# Patient Record
Sex: Female | Born: 1960 | Race: Black or African American | Hispanic: No | Marital: Married | Smoking: Former smoker
Health system: Southern US, Community
[De-identification: ages and names within clinical notes are randomized; demographics above are authoritative.]

## PROBLEM LIST (undated history)

## (undated) DIAGNOSIS — I10 Essential (primary) hypertension: Secondary | ICD-10-CM

## (undated) DIAGNOSIS — E7211 Homocystinuria: Secondary | ICD-10-CM

## (undated) DIAGNOSIS — K219 Gastro-esophageal reflux disease without esophagitis: Secondary | ICD-10-CM

## (undated) DIAGNOSIS — I639 Cerebral infarction, unspecified: Secondary | ICD-10-CM

## (undated) DIAGNOSIS — R1900 Intra-abdominal and pelvic swelling, mass and lump, unspecified site: Secondary | ICD-10-CM

## (undated) DIAGNOSIS — R630 Anorexia: Secondary | ICD-10-CM

## (undated) HISTORY — DX: Cerebral infarction, unspecified: I63.9

## (undated) HISTORY — DX: Gastro-esophageal reflux disease without esophagitis: K21.9

## (undated) HISTORY — DX: Anorexia: R63.0

## (undated) HISTORY — DX: Essential (primary) hypertension: I10

## (undated) HISTORY — PX: OTHER SURGICAL HISTORY: SHX169

## (undated) HISTORY — DX: Homocystinuria: E72.11

## (undated) HISTORY — PX: TUBAL LIGATION: SHX77

## (undated) HISTORY — DX: Intra-abdominal and pelvic swelling, mass and lump, unspecified site: R19.00

---

## 2003-05-03 ENCOUNTER — Emergency Department (HOSPITAL_COMMUNITY): Admission: EM | Admit: 2003-05-03 | Discharge: 2003-05-03 | Payer: Self-pay | Admitting: Emergency Medicine

## 2003-11-14 DIAGNOSIS — I639 Cerebral infarction, unspecified: Secondary | ICD-10-CM

## 2003-11-14 HISTORY — DX: Cerebral infarction, unspecified: I63.9

## 2005-01-17 ENCOUNTER — Inpatient Hospital Stay (HOSPITAL_COMMUNITY): Admission: EM | Admit: 2005-01-17 | Discharge: 2005-01-23 | Payer: Self-pay | Admitting: Emergency Medicine

## 2005-01-17 ENCOUNTER — Ambulatory Visit: Payer: Self-pay | Admitting: Cardiology

## 2005-01-17 ENCOUNTER — Ambulatory Visit: Payer: Self-pay | Admitting: Physical Medicine & Rehabilitation

## 2005-01-17 ENCOUNTER — Ambulatory Visit: Payer: Self-pay | Admitting: Infectious Diseases

## 2005-01-18 ENCOUNTER — Encounter: Payer: Self-pay | Admitting: Cardiology

## 2005-01-31 ENCOUNTER — Ambulatory Visit: Payer: Self-pay | Admitting: Internal Medicine

## 2005-03-29 ENCOUNTER — Ambulatory Visit: Payer: Self-pay | Admitting: Internal Medicine

## 2005-04-24 ENCOUNTER — Ambulatory Visit: Payer: Self-pay | Admitting: Internal Medicine

## 2005-08-17 ENCOUNTER — Ambulatory Visit: Payer: Self-pay | Admitting: Internal Medicine

## 2005-08-17 ENCOUNTER — Ambulatory Visit (HOSPITAL_COMMUNITY): Admission: RE | Admit: 2005-08-17 | Discharge: 2005-08-17 | Payer: Self-pay | Admitting: Internal Medicine

## 2005-08-17 ENCOUNTER — Encounter (INDEPENDENT_AMBULATORY_CARE_PROVIDER_SITE_OTHER): Payer: Self-pay | Admitting: Internal Medicine

## 2005-08-17 LAB — CONVERTED CEMR LAB: Pap Smear: NORMAL

## 2005-08-31 ENCOUNTER — Ambulatory Visit: Payer: Self-pay | Admitting: Internal Medicine

## 2005-09-18 ENCOUNTER — Encounter: Admission: RE | Admit: 2005-09-18 | Discharge: 2005-09-29 | Payer: Self-pay | Admitting: Internal Medicine

## 2006-01-24 ENCOUNTER — Ambulatory Visit (HOSPITAL_COMMUNITY): Admission: RE | Admit: 2006-01-24 | Discharge: 2006-01-24 | Payer: Self-pay | Admitting: Internal Medicine

## 2006-02-12 ENCOUNTER — Encounter: Admission: RE | Admit: 2006-02-12 | Discharge: 2006-02-12 | Payer: Self-pay | Admitting: Sports Medicine

## 2006-11-23 ENCOUNTER — Encounter (INDEPENDENT_AMBULATORY_CARE_PROVIDER_SITE_OTHER): Payer: Self-pay | Admitting: Internal Medicine

## 2006-11-23 DIAGNOSIS — M545 Low back pain, unspecified: Secondary | ICD-10-CM | POA: Insufficient documentation

## 2006-11-23 DIAGNOSIS — I10 Essential (primary) hypertension: Secondary | ICD-10-CM | POA: Insufficient documentation

## 2006-11-23 DIAGNOSIS — E7210 Disorders of sulfur-bearing amino-acid metabolism, unspecified: Secondary | ICD-10-CM | POA: Insufficient documentation

## 2006-11-23 DIAGNOSIS — Z8679 Personal history of other diseases of the circulatory system: Secondary | ICD-10-CM | POA: Insufficient documentation

## 2006-11-23 DIAGNOSIS — E7211 Homocystinuria: Secondary | ICD-10-CM

## 2006-11-23 HISTORY — DX: Homocystinuria: E72.11

## 2006-11-23 HISTORY — DX: Essential (primary) hypertension: I10

## 2006-11-27 ENCOUNTER — Encounter (INDEPENDENT_AMBULATORY_CARE_PROVIDER_SITE_OTHER): Payer: Self-pay | Admitting: Internal Medicine

## 2006-11-27 ENCOUNTER — Ambulatory Visit: Payer: Self-pay | Admitting: Internal Medicine

## 2006-11-27 DIAGNOSIS — R1900 Intra-abdominal and pelvic swelling, mass and lump, unspecified site: Secondary | ICD-10-CM | POA: Insufficient documentation

## 2006-11-27 DIAGNOSIS — R630 Anorexia: Secondary | ICD-10-CM

## 2006-11-27 DIAGNOSIS — K219 Gastro-esophageal reflux disease without esophagitis: Secondary | ICD-10-CM | POA: Insufficient documentation

## 2006-11-27 HISTORY — DX: Gastro-esophageal reflux disease without esophagitis: K21.9

## 2006-11-27 HISTORY — DX: Anorexia: R63.0

## 2006-11-27 HISTORY — DX: Intra-abdominal and pelvic swelling, mass and lump, unspecified site: R19.00

## 2006-11-27 LAB — CONVERTED CEMR LAB
BUN: 15 mg/dL (ref 6–23)
CO2: 21 meq/L (ref 19–32)
Calcium: 9.4 mg/dL (ref 8.4–10.5)
Chloride: 106 meq/L (ref 96–112)
Cholesterol: 166 mg/dL (ref 0–200)
Creatinine, Ser: 0.89 mg/dL (ref 0.40–1.20)
Glucose, Bld: 90 mg/dL (ref 70–99)
HDL: 74 mg/dL (ref >39)
LDL Cholesterol: 70 mg/dL (ref 0–99)
Potassium: 4.6 meq/L (ref 3.5–5.3)
Sodium: 137 meq/L (ref 135–145)
TSH: 0.867 microintl units/mL (ref 0.350–5.50)
Total CHOL/HDL Ratio: 2.2
Triglycerides: 110 mg/dL (ref <150)
VLDL: 22 mg/dL (ref 0–40)

## 2006-11-29 ENCOUNTER — Telehealth (INDEPENDENT_AMBULATORY_CARE_PROVIDER_SITE_OTHER): Payer: Self-pay | Admitting: Internal Medicine

## 2006-11-30 LAB — CONVERTED CEMR LAB

## 2006-12-26 ENCOUNTER — Telehealth: Payer: Self-pay | Admitting: *Deleted

## 2007-01-31 ENCOUNTER — Encounter (INDEPENDENT_AMBULATORY_CARE_PROVIDER_SITE_OTHER): Payer: Self-pay | Admitting: Internal Medicine

## 2007-06-26 ENCOUNTER — Telehealth: Payer: Self-pay | Admitting: Infectious Disease

## 2007-12-25 ENCOUNTER — Telehealth: Payer: Self-pay | Admitting: *Deleted

## 2008-01-27 ENCOUNTER — Ambulatory Visit: Payer: Self-pay | Admitting: Hospitalist

## 2008-01-27 ENCOUNTER — Encounter (INDEPENDENT_AMBULATORY_CARE_PROVIDER_SITE_OTHER): Payer: Self-pay | Admitting: Internal Medicine

## 2008-01-27 DIAGNOSIS — K029 Dental caries, unspecified: Secondary | ICD-10-CM | POA: Insufficient documentation

## 2008-01-28 LAB — CONVERTED CEMR LAB
ALT: 8 units/L (ref 0–35)
AST: 11 units/L (ref 0–37)
Albumin: 4.3 g/dL (ref 3.5–5.2)
Alkaline Phosphatase: 39 units/L (ref 39–117)
BUN: 13 mg/dL (ref 6–23)
Basophils Absolute: 0 10*3/uL (ref 0.0–0.1)
Basophils Relative: 0 % (ref 0–1)
CO2: 20 meq/L (ref 19–32)
Calcium: 9.2 mg/dL (ref 8.4–10.5)
Chloride: 109 meq/L (ref 96–112)
Cholesterol: 157 mg/dL (ref 0–200)
Creatinine, Ser: 1.03 mg/dL (ref 0.40–1.20)
Eosinophils Absolute: 0.2 10*3/uL (ref 0.0–0.7)
Eosinophils Relative: 2 % (ref 0–5)
Glucose, Bld: 98 mg/dL (ref 70–99)
HCT: 34.2 % — ABNORMAL LOW (ref 36.0–46.0)
HDL: 81 mg/dL (ref >39)
Hemoglobin: 11.6 g/dL — ABNORMAL LOW (ref 12.0–15.0)
LDL Cholesterol: 58 mg/dL (ref 0–99)
Lymphocytes Relative: 39 % (ref 12–46)
Lymphs Abs: 3.7 10*3/uL (ref 0.7–4.0)
MCHC: 33.9 g/dL (ref 30.0–36.0)
MCV: 92.7 fL (ref 78.0–100.0)
Monocytes Absolute: 1.2 10*3/uL — ABNORMAL HIGH (ref 0.1–1.0)
Monocytes Relative: 13 % — ABNORMAL HIGH (ref 3–12)
Neutro Abs: 4.5 10*3/uL (ref 1.7–7.7)
Neutrophils Relative %: 47 % (ref 43–77)
Platelets: 277 10*3/uL (ref 150–400)
Potassium: 4.8 meq/L (ref 3.5–5.3)
RBC: 3.69 M/uL — ABNORMAL LOW (ref 3.87–5.11)
RDW: 14.6 % (ref 11.5–15.5)
Sodium: 140 meq/L (ref 135–145)
TSH: 0.9 microintl units/mL (ref 0.350–5.50)
Total Bilirubin: 0.3 mg/dL (ref 0.3–1.2)
Total CHOL/HDL Ratio: 1.9
Total Protein: 7.5 g/dL (ref 6.0–8.3)
Triglycerides: 92 mg/dL (ref <150)
VLDL: 18 mg/dL (ref 0–40)
WBC: 9.5 10*3/uL (ref 4.0–10.5)

## 2009-12-21 ENCOUNTER — Telehealth: Payer: Self-pay | Admitting: Internal Medicine

## 2010-01-03 ENCOUNTER — Ambulatory Visit: Payer: Self-pay | Admitting: Internal Medicine

## 2010-01-03 ENCOUNTER — Encounter: Payer: Self-pay | Admitting: Internal Medicine

## 2010-01-03 LAB — CONVERTED CEMR LAB
ALT: 8 units/L (ref 0–35)
AST: 13 units/L (ref 0–37)
Albumin: 3.9 g/dL (ref 3.5–5.2)
Alkaline Phosphatase: 48 units/L (ref 39–117)
BUN: 9 mg/dL (ref 6–23)
CO2: 23 meq/L (ref 19–32)
Calcium: 9.2 mg/dL (ref 8.4–10.5)
Chloride: 102 meq/L (ref 96–112)
Cholesterol: 153 mg/dL (ref 0–200)
Creatinine, Ser: 0.9 mg/dL (ref 0.40–1.20)
Glucose, Bld: 96 mg/dL (ref 70–99)
HDL: 85 mg/dL (ref >39)
LDL Cholesterol: 52 mg/dL (ref 0–99)
Potassium: 4.4 meq/L (ref 3.5–5.3)
Sodium: 136 meq/L (ref 135–145)
Total Bilirubin: 0.3 mg/dL (ref 0.3–1.2)
Total CHOL/HDL Ratio: 1.8
Total Protein: 7.2 g/dL (ref 6.0–8.3)
Triglycerides: 82 mg/dL (ref <150)
VLDL: 16 mg/dL (ref 0–40)

## 2010-01-06 ENCOUNTER — Ambulatory Visit (HOSPITAL_COMMUNITY): Admission: RE | Admit: 2010-01-06 | Discharge: 2010-01-06 | Payer: Self-pay | Admitting: Internal Medicine

## 2010-03-03 ENCOUNTER — Telehealth: Payer: Self-pay | Admitting: Internal Medicine

## 2010-10-12 ENCOUNTER — Telehealth: Payer: Self-pay | Admitting: Internal Medicine

## 2010-12-04 ENCOUNTER — Encounter: Payer: Self-pay | Admitting: *Deleted

## 2010-12-13 NOTE — Progress Notes (Signed)
Summary: refill/gg  Phone Note Refill Request  on March 03, 2010 4:34 PM  Refills Requested: Medication #1:  LISINOPRIL-HYDROCHLOROTHIAZIDE 20-25 MG  TABS Take 1 tablet by mouth once a day.   Last Refilled: 01/26/2010  Method Requested: Electronic Initial call taken by: Merrie Roof RN,  March 03, 2010 4:35 PM  Follow-up for Phone Call        Rx denied because, she received 6 refills in February 2011, which means she doesn't need more refills for now.     Appended Document: refill/gg pharmacy informed

## 2010-12-13 NOTE — Miscellaneous (Signed)
Summary: HIPAA Restrictions  HIPAA Restrictions   Imported By: Florinda Marker 01/03/2010 15:40:58  _____________________________________________________________________  External Attachment:    Type:   Image     Comment:   External Document

## 2010-12-13 NOTE — Progress Notes (Signed)
Summary: med refill/gp   Phone Note Refill Request Message from:  Patient on October 12, 2010 5:07 PM  Refills Requested: Medication #1:  LISINOPRIL-HYDROCHLOROTHIAZIDE 20-25 MG  TABS Take 1 tablet by mouth once a day.  Method Requested: Electronic Initial call taken by: Chinita Pester RN,  October 12, 2010 5:08 PM  Follow-up for Phone Call        Refill approved for 2 month; patient needs to be seen for labwork before further refills are give. Please arrange followup visit.  Thanks!!!!    Prescriptions: LISINOPRIL-HYDROCHLOROTHIAZIDE 20-25 MG  TABS (LISINOPRIL-HYDROCHLOROTHIAZIDE) Take 1 tablet by mouth once a day  #31 x 2   Entered and Authorized by:   Vassie Loll MD   Signed by:   Vassie Loll MD on 10/13/2010   Method used:   Electronically to        [facility] (retail)       [address]       [place], [place]  [id number]       Ph: [phone]       Fax: [phone]   RxID:   [id number]

## 2010-12-13 NOTE — Progress Notes (Signed)
Summary: refill/ hla  Phone Note Refill Request Message from:  Fax from Pharmacy on December 21, 2009 12:31 PM  Refills Requested: Medication #1:  LISINOPRIL-HYDROCHLOROTHIAZIDE 20-25 MG  TABS Take 1 tablet by mouth once a day.   Last Refilled: 11/19/2009 Initial call taken by: Marin Roberts RN,  December 21, 2009 12:31 PM  Follow-up for Phone Call        Will refill her medications just for 61month; labs has not been checked since 2009. arrange a followup appoinment at earliest convenience.    Prescriptions: LISINOPRIL-HYDROCHLOROTHIAZIDE 20-25 MG  TABS (LISINOPRIL-HYDROCHLOROTHIAZIDE) Take 1 tablet by mouth once a day  #31 x 1   Entered and Authorized by:   Vassie Loll MD   Signed by:   Vassie Loll MD on 12/21/2009   Method used:   Electronically to        [facility] (retail)       [address]       [place], [place]  [id number]       Ph: [phone]       Fax: [phone]   RxID:   [id number]   Appended Document: refill/ hla finally spoke w/ pt, an appt is set, ask to come fasting for poss labs, pt is agreeable

## 2010-12-13 NOTE — Assessment & Plan Note (Signed)
Summary: checkup, labs, med refills/pcp-Erum Cercone/hla   Vital Signs:  Patient profile:   50 year old female Height:      62.3 inches (158.24 cm) Weight:      143.1 pounds (65.05 kg) BMI:     26.02 Temp:     97.5 degrees F (36.39 degrees C) oral Pulse rate:   77 / minute BP sitting:   112 / 80  (left arm)  Vitals Entered By: Stanton Kidney Ditzler RN (January 03, 2010 8:39 AM) Is Patient Diabetic? No Pain Assessment Patient in pain? no      Nutritional Status BMI of 25 - 29 = overweight Nutritional Status Detail appetite down  Have you ever been in a relationship where you felt threatened, hurt or afraid?denies   Does patient need assistance? Functional Status Self care Ambulation Normal Comments Refills on meds.   Primary Care Provider:  Yvonne Kendall, MD   History of Present Illness: 50 y/o with pmh significant for CVA (with residual left side weakness), HTN and dyspepsia; who comes to the clinic for followup and to get refills of her medications.  Patient denies any increased weakness on her left side or any new deficit; she also denies HA's, CP, SOB, nausea, vomiting, abdominal pain, diarrhea, peripheral edema or any other complaints.  Patient is compliant with her medications and even she is still smoking she has cut down to 1 pack per month now.  BP today 112/80.    Depression History:      The patient denies a depressed mood most of the day and a diminished interest in her usual daily activities.        The patient denies that she feels like life is not worth living, denies that she wishes that she were dead, and denies that she has thought about ending her life.         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 Take 1 Tablet By Mouth Two Times A Day 2)  Protonix 40 Mg Tbec (Pantoprazole Sodium) .... Take 1 Tablet By Mouth Once A Day 3)  Lisinopril-Hydrochlorothiazide 20-25 Mg  Tabs (Lisinopril-Hydrochlorothiazide) .... Take 1 Tablet By Mouth Once A Day  Current Medications (verified): 1)  Aggrenox 25-200 Mg Cp12 (Aspirin-Dipyridamole) .... Take 1 Tablet By Mouth Two Times A Day 2)  Protonix 40 Mg Tbec (Pantoprazole Sodium) .... Take 1 Tablet By Mouth Once A Day 3)  Lisinopril-Hydrochlorothiazide 20-25 Mg  Tabs (Lisinopril-Hydrochlorothiazide) .... 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  Physical Exam  General:  alert, well-developed, well-nourished, well-hydrated, and cooperative to examination.   Lungs:  Normal respiratory effort, chest expands symmetrically. Lungs are clear to auscultation, no crackles or wheezes. Heart:  Normal rate and regular rhythm. S1 and S2 normal without gallop, murmur, click, rub or other extra sounds. Abdomen:  Bowel sounds positive,abdomen soft and non-tender without masses, organomegaly or hernias noted. Msk:  normal ROM, no joint tenderness, no joint swelling, and no joint deformities.   Neurologic:  alert & oriented X3.  Muscle strenght 5/5 on her right side, 3/5 on her left side (residual weakness after CVA); hyperreflexia on the left in comparisson to the right, CN II-XII essentially intact.   Impression & Recommendations:  Problem # 1:  GERD (ICD-530.81) Patient is doing great and denies any worsening on her reflux and indigestion symptoms. Will continue using daily protonix.  Her updated medication list for this problem includes:    Protonix 40 Mg Tbec (Pantoprazole sodium) .Marland Kitchen... Take 1 tablet by mouth once a day  Problem # 2:  HYPERTENSION (ICD-401.9) Wellcontrolled and at goal. Will continue current regimen and will check renalfunction and electrolytes.  Her updated medication list for this problem includes:    Lisinopril-hydrochlorothiazide 20-25 Mg Tabs (Lisinopril-hydrochlorothiazide) .Marland Kitchen... Take 1 tablet by mouth once a day  Orders: T-Comprehensive Metabolic Panel ([id number])  Problem # 3:  CEREBROVASCULAR ACCIDENT, HX OF (ICD-V12.50) Patient w/o any new deficit. Will continue controlling her BP and will also continue aggrenox two times a day. Will check lipid profile and if needed will start statins. Patient will continue daily exercises to make her left side stronger.  Problem # 4:   Preventive Health Care (ICD-V70.0) Will arrange screening for colon cancer and will also schedule her mammogram for this year. During next visit will perform a pap smear. Patient will received smoking cessation tips and also will get hot line phone number for outpatient support.  Complete Medication List: 1)  Aggrenox 25-200 Mg Cp12 (Aspirin-dipyridamole) .... Take 1 tablet by mouth two times a day 2)  Protonix 40 Mg Tbec (Pantoprazole sodium) .... Take 1 tablet by mouth once a day 3)  Lisinopril-hydrochlorothiazide 20-25 Mg Tabs (Lisinopril-hydrochlorothiazide) .... Take 1 tablet by mouth once a day  Other Orders: Gastroenterology Referral (GI) Mammogram (Screening) (Mammo) T-Lipid Profile ([id number])  Patient Instructions: 1)  Please schedule a follow-up appointment in 6 months. 2)  You will be called with any abnormalities in your bloodwork from today. 3)  Continue all your medicines as prescribed. 4)  Call 1-800-quit-now (hot line to help you succesfully quit smoking) 5)  Tobacco is very bad for your health and your loved ones! You Should stop smoking!. 6)  Stop Smoking Tips: Choose a Quit date. Cut down before the Quit date. decide what you will do as a substitute when you feel the urge to smoke(gum,toothpick,exercise). Prescriptions: LISINOPRIL-HYDROCHLOROTHIAZIDE 20-25  MG  TABS (LISINOPRIL-HYDROCHLOROTHIAZIDE) Take 1 tablet by mouth once a day  #31 x 6   Entered and Authorized by:   Vassie Loll MD   Signed by:   Vassie Loll MD on 01/03/2010   Method used:   Electronically to        [facility] (retail)       [address]       [place], [place]  [id number]       Ph: [phone]       Fax: [phone]   RxID:   [id number] PROTONIX 40 MG TBEC (PANTOPRAZOLE SODIUM) Take 1 tablet by mouth once a day  #31 x 10   Entered and Authorized by:   Vassie Loll MD   Signed by:   Vassie Loll MD on 01/03/2010   Method used:   Electronically to         [facility] (retail)       [address]       [place], [place]  [id number]       Ph: [phone]       Fax: [phone]   RxID:   [id number] AGGRENOX 25-200 MG CP12 (ASPIRIN-DIPYRIDAMOLE) Take 1 tablet by mouth two times a day  #62 x 9   Entered and Authorized by:   Vassie Loll MD   Signed by:   Vassie Loll MD on 01/03/2010   Method used:   Electronically to        [facility] (retail)       [address]       [place], [place]  [id number]       Ph: [phone]       Fax: [phone]   RxID:   [id number]   Prevention & Chronic Care Immunizations   Influenza vaccine: Not documented   Influenza vaccine deferral: Deferred  (01/03/2010)    Tetanus booster: Not documented   Td booster deferral: Deferred  (01/03/2010)    Pneumococcal vaccine: Not documented   Pneumococcal vaccine deferral: Deferred  (01/03/2010)  Colorectal Screening   Hemoccult: Not documented    Colonoscopy: Not documented   Colonoscopy action/deferral: GI referral  (01/03/2010)  Other Screening   Pap smear: 11/30/06  (11/30/2006)   Pap smear action/deferral: Deferred  (01/03/2010)    Mammogram: Abnormal  (02/12/2006)   Mammogram action/deferral: Ordered  (01/03/2010)   Smoking status: current  (01/03/2010)   Smoking cessation counseling: yes  (01/03/2010)    Screening comments: Patient will like to has her pap smear done during next visit.  Lipids   Total Cholesterol: 157  (01/27/2008)   Lipid panel action/deferral: Lipid Panel ordered   LDL: 58  (01/27/2008)   LDL Direct: Not documented   HDL: 81  (01/27/2008)   Triglycerides: 92  (01/27/2008)  Hypertension   Last Blood Pressure: 112 / 80  (01/03/2010)   Serum creatinine: 1.03  (01/27/2008)   Serum potassium 4.8  (01/27/2008) CMP ordered     Hypertension flowsheet reviewed?: Yes   Progress toward BP goal: At goal  Self-Management Support :    Patient will work on the  following items until the next clinic visit to reach self-care goals:     Medications and monitoring: take my medicines every day, bring all of my medications to every visit  (01/03/2010)     Eating: drink diet soda or water instead of juice or soda, eat more vegetables, use fresh or frozen vegetables, eat  foods that are low in salt, eat baked foods instead of fried foods, eat fruit for snacks and desserts, limit or avoid alcohol  (01/03/2010)    Hypertension self-management support: Written self-care plan, Resources for patients handout  (01/03/2010)   Hypertension self-care plan printed.      Resource handout printed.   Nursing Instructions: GI referral for screening colonoscopy (see order) Schedule screening mammogram (see order)   Process Orders Check Orders Results:     [facility]: Check successful Tests Sent for requisitioning (January 03, 2010 9:08 AM):     01/03/2010: [facility] -- T-Lipid Profile [phone] (signed)     01/03/2010: [facility] -- T-Comprehensive Metabolic Panel [phone] (signed)

## 2011-01-09 ENCOUNTER — Other Ambulatory Visit: Payer: Self-pay | Admitting: *Deleted

## 2011-01-09 MED ORDER — ASPIRIN-DIPYRIDAMOLE ER 25-200 MG PO CP12: 1.0000 | ORAL_CAPSULE | Freq: Two times a day (BID) | ORAL | Status: AC

## 2011-01-27 ENCOUNTER — Other Ambulatory Visit: Payer: Self-pay | Admitting: *Deleted

## 2011-01-27 MED ORDER — LISINOPRIL-HYDROCHLOROTHIAZIDE 20-25 MG PO TABS: 1.0000 | ORAL_TABLET | Freq: Every day | ORAL | Status: DC

## 2011-01-31 ENCOUNTER — Encounter: Payer: Self-pay | Admitting: Internal Medicine

## 2011-03-09 ENCOUNTER — Other Ambulatory Visit: Payer: Self-pay | Admitting: Internal Medicine

## 2011-03-17 ENCOUNTER — Other Ambulatory Visit: Payer: Self-pay | Admitting: *Deleted

## 2011-03-17 MED ORDER — LISINOPRIL-HYDROCHLOROTHIAZIDE 20-25 MG PO TABS: 1.0000 | ORAL_TABLET | Freq: Every day | ORAL | Status: DC

## 2011-03-20 ENCOUNTER — Other Ambulatory Visit: Payer: Self-pay | Admitting: Internal Medicine

## 2011-03-20 ENCOUNTER — Other Ambulatory Visit: Payer: Self-pay | Admitting: *Deleted

## 2011-03-31 NOTE — Discharge Summary (Signed)
NAMEKEYARI, [name]               ACCOUNT NO.:  [id number]   MEDICAL RECORD NO.:  [id number]          PATIENT TYPE:  INP   LOCATION:  3001                         FACILITY:  [facility]   PHYSICIAN:  Fransisco Hertz, M.D.  DATE OF BIRTH:  [birth year]   DATE OF ADMISSION:  01/17/2005  DATE OF DISCHARGE:  01/23/2005                                 DISCHARGE SUMMARY   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  Her discharge medications were  as follows:  1.  Aggrenox one b.i.d.  2.  FOLTX one daily.  3.  Hydrochlorothiazide 25 mg daily.  4.  Prinivil 10 mg daily.  5.  Vicodin 1-2 tabs every 6 hours as needed.   ATTENDING:  Dr. Lina Sayre.   UPPER LEVEL RESIDENT:  Dr. Kathe Mariner.      ML/MEDQ  D:  01/24/2005  T:  01/24/2005  Job:  [id number]

## 2011-03-31 NOTE — Consult Note (Signed)
[name], [name]               ACCOUNT NO.:  [id number]   MEDICAL RECORD NO.:  [id number]          PATIENT TYPE:  INP   LOCATION:  3001                         FACILITY:  [facility]   PHYSICIAN:  Pramod P. Pearlean Brownie, MD    DATE OF BIRTH:  1961/09/24   DATE OF CONSULTATION:  DATE OF DISCHARGE:                                   CONSULTATION   REFERRING PHYSICIAN:  Fransisco Hertz, M.D.   REASON FOR REFERRAL:  Stroke.   [name] is a 50 year old African-American lady with vascular risk factors  only of hypertension and smoking, who developed a right middle cerebral  artery parietal infarction with left-sided weakness, numbness, and visual  field deficit.  She has been admitted a few days ago.  CT scan of the head  was unremarkable, and MRI scan of the brain showed a right parietal middle  cerebral artery distribution and infarction along the posterior division.  MRA of the brain revealed diminished branches of the M2 portion of the right  middle cerebral artery.  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  She has had a subsequent stroke workup including  carotid ultrasound which reveals no hemodynamically significant stenosis.  She has obtained some significant improvement, though she still has left-  sided sensory deficit and visual field deficit persisted.   HOME MEDICATIONS:  Lopressor.   PAST MEDICAL HISTORY:  1.  Hypertension.  2.  Recent headaches.   SOCIAL HISTORY:  She is married.  She lives with her husband.  She smokes  one pack per day for 20 years, and drinks one beer a week.   MEDICATION ALLERGIES:  None known.   FAMILY HISTORY:  Significant for stroke in a cousin.   PHYSICAL EXAMINATION:  GENERAL:  Reveals a pleasant middle-aged, African-  American lady who is not in distress.  TEMPERATURE:  She is afebrile.  PULSE RATE:  72 per minute, regular.  RESPIRATORY  RATE:  16 per minute.  DISTAL PULSES:  Well felt.  NEUROLOGIC:  She is drowsy, but opens eyes easily and follows commands quite  well.  There is no slurred speech or aphasia.  Eye movements are full range,  without nystagmus.  She has a dense left homonymous hemianopsia.  Visual  acuity is adequate.  Face is symmetric.  Palatal movements are normal.  Tongue is midline.  Motor system exam reveals no drift, but there is minimum  weakness of fine finger movements on the left and foot tapping on the left.  Finger-to-nose coordination is impaired on the left compared to the right.  She has decreased touch and pinprick sensation on the left side as well as  inattention to the left.  Position sense seems to be preserved.   DATA REVIEWED:  As previously stated, MRI scan, MRA, and ultrasound results  are reviewed.   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   PLAN:  It would be appropriate to do transcranial Doppler bubble study to  look for patent foramen ovale and evidence of paradoxical embolism.  After  taking verbal informed consent from the patient, I explained the risks,  benefits, and gave the patient an opportunity to ask questions.  Transcranial Doppler bubble study was done at the bedside.  The middle and  anterior cerebral arteries were insonated using standard protocol.  9 mL of  saline along with 1 mL of air were agitated in 3-way stopcock and injected  into IV in the right forearm.  No high-intensity transient signals, or HITS,  were identified.  The procedure was repeated after performing Valsalva  maneuver with good effort, and again no HITS were noted.   IMPRESSION:  Negative transcranial Doppler bubble study.  No evidence of  right-to-left intracardiac communication by this study.  The patient's  labs  showed elevated homocysteine of 14.75, with triglycerides of 197, and LDL of  57, total cholesterol 149.   PLAN:  I would recommend treatment for elevated homocysteine with Foltx once  a day as well as for triglycerides with diet control.  Check hypercoagulable  panel labs as well.  Physical and occupational rehabilitation consult.  A  transesophageal echocardiogram is to be done later today.  If no clear-cut  cardiac source of embolism is found, I recommend changing aspirin to  Aggrenox for secondary stroke prevention.   Thank you for the referral.      PPS/MEDQ  D:  01/20/2005  T:  01/20/2005  Job:  [id number]   cc:   Fransisco Hertz, M.D.  [address]  [place] [address]  Fax: [phone]

## 2011-03-31 NOTE — Discharge Summary (Signed)
[name], [name]               ACCOUNT NO.:  [id number]   MEDICAL RECORD NO.:  [id number]          PATIENT TYPE:  INP   LOCATION:  3001                         FACILITY:  [facility]   PHYSICIAN:  Zetta Bills, MD          DATE OF BIRTH:  04-17-1961   DATE OF ADMISSION:  01/17/2005  DATE OF DISCHARGE:  01/23/2005                                 DISCHARGE SUMMARY   ADDENDUM:   DISCHARGE DIAGNOSIS:  Cerebrovascular accident due to ischemia in the right  parietal area of the brain.   DISCHARGE MEDICATIONS:  1.  Aggrenox one tablet b.i.d.  2.  Foltx one tablet daily.  3.  Hydrochlorothiazide 25 mg daily.  4.  Prednisone 10 mg daily.  5.  Vicodin one to two tablets q.6h. p.r.n.   Dictated by Corrie Dandy __________      JP/MEDQ  D:  01/24/2005  T:  01/24/2005  Job:  [id number]

## 2011-03-31 NOTE — Consult Note (Signed)
[name], [name]               ACCOUNT NO.:  [id number]   MEDICAL RECORD NO.:  [id number]          PATIENT TYPE:  INP   LOCATION:  3001                         FACILITY:  [facility]   PHYSICIAN:  Michael L. Reynolds, M.D.DATE OF BIRTH:  1961/07/11   DATE OF CONSULTATION:  01/19/2005  DATE OF DISCHARGE:                                   CONSULTATION   REASON FOR CONSULTATION:  Stroke.   HISTORY OF PRESENT ILLNESS:  This is the initial inpatient consultation  evaluation of this 50 year old woman with little past medical history.  The  patient began having significant headaches about one month ago, and on a  couple of visits to her primary physician was found to be hypertensive.  She  was then placed on various medications, which were not of very much benefit.  The patient came back to an urgent care center at the request of her  husband, who noticed that she was walking into things and seemed to have  some slurred speech and some left-sided weakness.  Her initial CAT scan  raised the possibility of a right brain stroke, and she was subsequently  admitted for further evaluation and management.  The patient presently  reports that she is feeling good.  She denies any pain or any other  particular difficulties.  She continues to complain of some right-sided  headache, which has been on and off.  She is eating well.  Denies any  problems there, and is ambulating in the room.  She denies having any  previous history of headaches.   PAST MEDICAL HISTORY:  Elevated blood pressure.  The patient reports that  her blood pressures were always fine until about one month ago.  Apparently  they have been pretty consistently high since that time.  She denies any  other chronic medical problems.   FAMILY/SOCIAL HISTORY/REVIEW OF SYSTEMS:  As outlined in the initial H&P of  January 17, 2005, as well as in the accompanying medical student note which is  reviewed and approved.   MEDICATIONS:  Prior to  coming in she was on an unknown antihypertensive.  Presently she is on:  Lisinopril, HCTZ, and aspirin as well as p.r.n.  Clonidine.   PHYSICAL EXAMINATION:  VITAL SIGNS:  Temperature 98.4, blood pressure  123/73, pulse 87, respirations 20.  GENERAL:  This is a healthy-appearing female, seated, in no evident  distress.  HEENT:  Head -- Normocephalic and atraumatic.  Oropharynx benign.  NECK:  Supple without carotid or supraclavicular bruits.  HEART:  Regular rate and rhythm without murmurs.  NEUROLOGIC:  Mental Status:  She is awake and alert.  She is fully oriented  to time, place and person.  Recent and remote memory are intact.  Attention  span and concentration, fund of knowledge are all performed reasonably well.  She had no defects to confrontational naming.  Cranial nerves: funduscopic  examination benign.  Pupils are small but reactive.  Extraocular movements  are full without nystagmus.  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  Facial sensation intact to light touch.  Face, tongue and palate move  normally and symmetrically.  Hearing is intact to conversational speech.  Shoulder shrug strength is normal.  Motor: normal bulk and tone, normal  strength in all tested extremity muscles, although she demonstrates some  motor neglect on the left when she is not looking at the left sided limbs.  Sensation intact to light touch in all extremities, but she consistently  extinguishes to double  simultaneous stimulation on the left.  Coordination  and rapid movements are performed well.  Finger-to-nose performed  adequately.  Gait: she is able to ambulate independently.  Reflexes 2+ and  symmetric.  Toes are downgoing.   LABORATORY REVIEW:  CBC:  White count 8.1, hemoglobin 13.2, platelet count  222,000.  BMET from this morning is normal.  Urinalysis negative.  Her lipid  panel from yesterday is normal, with an LDL 57, HDL 53.  Homocysteine is  mildly elevated at 14.75.  MRI of  the brain with MRA and intracranial  circulation is personally reviewed.  The study is remarkable for a subacute  stroke in the right MCA territory, mostly involving the posterior areas,  with corresponding pruning of the branches of right cerebral artery on the  MRA; no hemorrhage is noted.  No other significant abnormalities are seen.   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  The risk  factors include hypertension and tobacco abuse.   RECOMMENDATIONS:  Workup as done to this point is noted, carotid Doppler and  2-D echocardiogram are unremarkable.  Will check a transcranial Doppler with  a bubble study, as well as a transesophageal echocardiogram.  Also, the MRA  was read actually as possible finding of fibromuscular dysplasia.  On my  review of the study I think that is over read, but if all else is negative  consider a possible angiogram.  Stroke service is to follow.   Thank you for the consultation.      MLR/MEDQ  D:  01/19/2005  T:  01/19/2005  Job:  [id number]
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
## 2011-05-25 ENCOUNTER — Other Ambulatory Visit: Payer: Self-pay | Admitting: Internal Medicine

## 2011-06-28 ENCOUNTER — Other Ambulatory Visit: Payer: Self-pay | Admitting: Internal Medicine

## 2011-08-09 ENCOUNTER — Other Ambulatory Visit: Payer: Self-pay | Admitting: Internal Medicine

## 2011-09-13 ENCOUNTER — Other Ambulatory Visit: Payer: Self-pay | Admitting: Internal Medicine

## 2011-10-19 ENCOUNTER — Other Ambulatory Visit: Payer: Self-pay | Admitting: Internal Medicine

## 2012-03-14 ENCOUNTER — Other Ambulatory Visit: Payer: Self-pay | Admitting: Internal Medicine

## 2012-03-14 NOTE — Telephone Encounter (Signed)
According to chart, pt has transferred her care.

## 2012-03-20 DIAGNOSIS — I10 Essential (primary) hypertension: Secondary | ICD-10-CM | POA: Diagnosis not present

## 2012-03-20 DIAGNOSIS — F17200 Nicotine dependence, unspecified, uncomplicated: Secondary | ICD-10-CM | POA: Diagnosis not present

## 2012-03-20 DIAGNOSIS — Z8673 Personal history of transient ischemic attack (TIA), and cerebral infarction without residual deficits: Secondary | ICD-10-CM | POA: Diagnosis not present

## 2012-03-20 DIAGNOSIS — R12 Heartburn: Secondary | ICD-10-CM | POA: Diagnosis not present

## 2012-12-24 DIAGNOSIS — Z8673 Personal history of transient ischemic attack (TIA), and cerebral infarction without residual deficits: Secondary | ICD-10-CM | POA: Diagnosis not present

## 2012-12-24 DIAGNOSIS — E871 Hypo-osmolality and hyponatremia: Secondary | ICD-10-CM | POA: Diagnosis not present

## 2012-12-24 DIAGNOSIS — F17200 Nicotine dependence, unspecified, uncomplicated: Secondary | ICD-10-CM | POA: Diagnosis not present

## 2012-12-24 DIAGNOSIS — I10 Essential (primary) hypertension: Secondary | ICD-10-CM | POA: Diagnosis not present

## 2013-06-21 DIAGNOSIS — R1013 Epigastric pain: Secondary | ICD-10-CM | POA: Diagnosis not present

## 2013-06-23 ENCOUNTER — Emergency Department (HOSPITAL_COMMUNITY)
Admission: EM | Admit: 2013-06-23 | Discharge: 2013-06-24 | Disposition: A | Discharge status: Home / Self Care | Payer: BC Managed Care – PPO | Attending: Emergency Medicine | Admitting: Emergency Medicine

## 2013-06-23 ENCOUNTER — Encounter (HOSPITAL_COMMUNITY): Payer: Self-pay | Admitting: Emergency Medicine

## 2013-06-23 DIAGNOSIS — Z8739 Personal history of other diseases of the musculoskeletal system and connective tissue: Secondary | ICD-10-CM | POA: Insufficient documentation

## 2013-06-23 DIAGNOSIS — Z872 Personal history of diseases of the skin and subcutaneous tissue: Secondary | ICD-10-CM | POA: Insufficient documentation

## 2013-06-23 DIAGNOSIS — Z79899 Other long term (current) drug therapy: Secondary | ICD-10-CM | POA: Diagnosis not present

## 2013-06-23 DIAGNOSIS — E871 Hypo-osmolality and hyponatremia: Secondary | ICD-10-CM | POA: Diagnosis not present

## 2013-06-23 DIAGNOSIS — Z8659 Personal history of other mental and behavioral disorders: Secondary | ICD-10-CM | POA: Diagnosis not present

## 2013-06-23 DIAGNOSIS — Z8673 Personal history of transient ischemic attack (TIA), and cerebral infarction without residual deficits: Secondary | ICD-10-CM | POA: Insufficient documentation

## 2013-06-23 DIAGNOSIS — Z862 Personal history of diseases of the blood and blood-forming organs and certain disorders involving the immune mechanism: Secondary | ICD-10-CM | POA: Insufficient documentation

## 2013-06-23 DIAGNOSIS — K219 Gastro-esophageal reflux disease without esophagitis: Secondary | ICD-10-CM | POA: Insufficient documentation

## 2013-06-23 DIAGNOSIS — K859 Acute pancreatitis without necrosis or infection, unspecified: Secondary | ICD-10-CM | POA: Diagnosis not present

## 2013-06-23 DIAGNOSIS — I10 Essential (primary) hypertension: Secondary | ICD-10-CM | POA: Insufficient documentation

## 2013-06-23 DIAGNOSIS — F17200 Nicotine dependence, unspecified, uncomplicated: Secondary | ICD-10-CM | POA: Diagnosis not present

## 2013-06-23 LAB — COMPREHENSIVE METABOLIC PANEL
ALT: 9 U/L (ref 0–35)
AST: 17 U/L (ref 0–37)
Alkaline Phosphatase: 55 U/L (ref 39–117)
BUN: 12 mg/dL (ref 6–23)
Calcium: 9.8 mg/dL (ref 8.4–10.5)
Creatinine, Ser: 0.96 mg/dL (ref 0.50–1.10)
GFR calc Af Amer: 76 mL/min — ABNORMAL LOW (ref >90)
GFR calc non Af Amer: 66 mL/min — ABNORMAL LOW (ref >90)
Glucose, Bld: 87 mg/dL (ref 70–99)
Total Bilirubin: 0.2 mg/dL — ABNORMAL LOW (ref 0.3–1.2)
Total Protein: 8 g/dL (ref 6.0–8.3)

## 2013-06-23 LAB — CBC WITH DIFFERENTIAL/PLATELET
Basophils Absolute: 0 10*3/uL (ref 0.0–0.1)
Eosinophils Relative: 0 % (ref 0–5)
HCT: 31.6 % — ABNORMAL LOW (ref 36.0–46.0)
Hemoglobin: 11.1 g/dL — ABNORMAL LOW (ref 12.0–15.0)
Lymphocytes Relative: 22 % (ref 12–46)
MCH: 29.1 pg (ref 26.0–34.0)
Monocytes Absolute: 2.3 10*3/uL — ABNORMAL HIGH (ref 0.1–1.0)
Monocytes Relative: 15 % — ABNORMAL HIGH (ref 3–12)
Neutro Abs: 9.4 10*3/uL — ABNORMAL HIGH (ref 1.7–7.7)
RBC: 3.81 MIL/uL — ABNORMAL LOW (ref 3.87–5.11)
RDW: 15.8 % — ABNORMAL HIGH (ref 11.5–15.5)
WBC: 14.3 10*3/uL — ABNORMAL HIGH (ref 4.0–10.5)

## 2013-06-23 LAB — URINALYSIS, ROUTINE W REFLEX MICROSCOPIC
Glucose, UA: NEGATIVE mg/dL
Ketones, ur: 15 mg/dL — AB
Nitrite: NEGATIVE
Protein, ur: 100 mg/dL — AB
Specific Gravity, Urine: >1.03 — ABNORMAL HIGH (ref 1.005–1.030)
Urobilinogen, UA: 0.2 mg/dL (ref 0.0–1.0)
pH: 5.5 (ref 5.0–8.0)

## 2013-06-23 LAB — URINE MICROSCOPIC-ADD ON

## 2013-06-23 MED ORDER — SODIUM CHLORIDE 0.9 % IV BOLUS (SEPSIS)
1000.0000 mL | Freq: Once | INTRAVENOUS | Status: AC
  Administered 2013-06-23: 1000 mL via INTRAVENOUS

## 2013-06-23 NOTE — ED Notes (Addendum)
IV team paged after RN unable to start IV

## 2013-06-23 NOTE — ED Provider Notes (Signed)
CSN: [id number]     Arrival date & time 06/23/13  1600 History     First MD Initiated Contact with Patient 06/23/13 1931     Chief Complaint  Patient presents with  . Abnormal Lab    HPI Patient was at urgent care because of abdominal discomfort on Saturday mostly of nausea. No vomiting no diarrhea she states she may have drank a little bit less over the weekend. The diarrhea started to sugar frequently hematuria. Had labs obtained on Saturday. Thought with her primary care physician today. Her sodium was 120 on Saturday is 124 and primary care physicians today she was referred here by her primary care physician because of low sodium she takes hydrochlorothiazide lisinopril and has for years. No other fluid loss. She denied muscle aches cramps she got dizzy lightheaded and weak she is actually somewhat put off when I talk with her that she is here because she states "I feel fine by Dr. wanted me to come in"   Past Medical History  Diagnosis Date  . Cerebrovascular accident (stroke) 11/23/2006  . 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 Hypertension 11/23/2006  . Low back pain 11/23/2006  . 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 GERD (gastroesophageal reflux disease) 11/27/2006  . Dental caries 01/27/2008   Past Surgical History  Procedure Laterality Date  . Cesarean section     Family History  Problem Relation Age of Onset  . Hypertension Mother   . Hypertension Daughter    History  Substance Use Topics  . Smoking status: Current Some Day Smoker    Types: Cigarettes  . Smokeless tobacco: Not on file  . Alcohol Use: No   OB History   Grav Para Term Preterm Abortions TAB SAB Ect Mult Living                 Review of Systems  Constitutional: Negative for fever, chills, diaphoresis, appetite change and fatigue.  HENT: Negative for sore throat, mouth sores and trouble swallowing.   Eyes: Negative for visual  disturbance.  Respiratory: Negative for cough, chest tightness, shortness of breath and wheezing.   Cardiovascular: Negative for chest pain.  Gastrointestinal: Negative for nausea, vomiting, abdominal pain, diarrhea and abdominal distention.  Endocrine: Negative for polydipsia, polyphagia and polyuria.  Genitourinary: Negative for dysuria, frequency and hematuria.  Musculoskeletal: Negative for gait problem.  Skin: Negative for color change, pallor and rash.  Neurological: Negative for dizziness, syncope, light-headedness and headaches.  Hematological: Does not bruise/bleed easily.  Psychiatric/Behavioral: Negative for behavioral problems and confusion.    Allergies  Review of patient's allergies indicates no known allergies.  Home Medications   Current Outpatient Rx  Name  Route  Sig  Dispense  Refill  . dipyridamole-aspirin (AGGRENOX) 200-25 MG per 12 hr capsule   Oral   Take 1 capsule by mouth 2 (two) times daily.         Marland Kitchen ENSURE PLUS (ENSURE PLUS) LIQD   Oral   Take 237 mLs by mouth daily.         Marland Kitchen lisinopril-hydrochlorothiazide (PRINZIDE,ZESTORETIC) 20-25 MG per tablet   Oral   Take 1 tablet by mouth daily.         . pantoprazole (PROTONIX) 40 MG tablet   Oral   Take 40 mg by mouth daily.         Marland Kitchen EXPIRED: lisinopril-hydrochlorothiazide (PRINZIDE,ZESTORETIC) 20-25 MG per tablet   Oral  Take 1 tablet by mouth daily.   30 tablet   0    BP 126/77  Pulse 102  Temp(Src) 98.4 F (36.9 C) (Oral)  Resp 18  SpO2 100% Physical Exam  Constitutional: She is oriented to person, place, and time. She appears well-developed and well-nourished. No distress.  HENT:  Head: Normocephalic.  Eyes: Conjunctivae are normal. Pupils are equal, round, and reactive to light. No scleral icterus.  Neck: Normal range of motion. Neck supple. No thyromegaly present.  Cardiovascular: Normal rate and regular rhythm.  Exam reveals no gallop and no friction rub.   No murmur  heard. Pulmonary/Chest: Effort normal and breath sounds normal. No respiratory distress. She has no wheezes. She has no rales.  Abdominal: Soft. Bowel sounds are normal. She exhibits no distension. There is no tenderness. There is no rebound.  Musculoskeletal: Normal range of motion.  Neurological: She is alert and oriented to person, place, and time.  Skin: Skin is warm and dry. No rash noted.  Psychiatric: She has a normal mood and affect. Her behavior is normal.   no muscle tremors no demonstrable weakness.  Her thought processes and cognition are intact  ED Course   Procedures (including critical care time)  Labs Reviewed  COMPREHENSIVE METABOLIC PANEL - Abnormal; Notable for the following:    Sodium 124 (*)    Potassium 3.4 (*)    Chloride 83 (*)    Total Bilirubin 0.2 (*)    GFR calc non Af Amer 66 (*)    GFR calc Af Amer 76 (*)    All other components within normal limits  CBC WITH DIFFERENTIAL - Abnormal; Notable for the following:    WBC 14.3 (*)    RBC 3.81 (*)    Hemoglobin 11.1 (*)    HCT 31.6 (*)    RDW 15.8 (*)    Neutro Abs 9.4 (*)    Monocytes Relative 15 (*)    Monocytes Absolute 2.3 (*)    All other components within normal limits  URINALYSIS, ROUTINE W REFLEX MICROSCOPIC - Abnormal; Notable for the following:    APPearance CLOUDY (*)    Specific Gravity, Urine >1.030 (*)    Hgb urine dipstick LARGE (*)    Bilirubin Urine SMALL (*)    Ketones, ur 15 (*)    Protein, ur 100 (*)    Leukocytes, UA LARGE (*)    All other components within normal limits  URINE MICROSCOPIC-ADD ON - Abnormal; Notable for the following:    Squamous Epithelial / LPF FEW (*)    Bacteria, UA FEW (*)    Casts HYALINE CASTS (*)    All other components within normal limits  URINE CULTURE   No results found. No diagnosis found.  MDM  She is hyponatremic I think this is that it slowly. This may be due to some decreased intake as well as her diuretics and ACE inhibitor. Pressure  here is 99 systolic. He is given the first liter fluid pressure of 110 and will be given a second liter. I do not feel she needs admission. We will hold her ACE inhibitor and hydrochlorothiazide continue to hydrate herself. Her urine has abnormalities that she is asymptomatic with.  we'll await culture. She should recheck with her primary care physician in 48-72 hours recheck of blood pressure and sodium.  Claudean Kinds, MD 06/23/13 2125

## 2013-06-23 NOTE — ED Notes (Signed)
Pt sent here by PCP for hyponatremia

## 2013-06-24 ENCOUNTER — Other Ambulatory Visit: Payer: Self-pay

## 2013-06-24 DIAGNOSIS — R748 Abnormal levels of other serum enzymes: Secondary | ICD-10-CM

## 2013-06-24 DIAGNOSIS — R1013 Epigastric pain: Secondary | ICD-10-CM

## 2013-06-24 DIAGNOSIS — K859 Acute pancreatitis without necrosis or infection, unspecified: Secondary | ICD-10-CM

## 2013-06-25 LAB — URINE CULTURE

## 2013-06-26 ENCOUNTER — Ambulatory Visit
Admission: RE | Admit: 2013-06-26 | Discharge: 2013-06-26 | Disposition: A | Discharge status: Home / Self Care | Payer: Medicare Other | Source: Ambulatory Visit

## 2013-06-26 DIAGNOSIS — R748 Abnormal levels of other serum enzymes: Secondary | ICD-10-CM

## 2013-06-26 DIAGNOSIS — N189 Chronic kidney disease, unspecified: Secondary | ICD-10-CM | POA: Diagnosis not present

## 2013-06-26 DIAGNOSIS — R1013 Epigastric pain: Secondary | ICD-10-CM

## 2013-06-26 DIAGNOSIS — K859 Acute pancreatitis without necrosis or infection, unspecified: Secondary | ICD-10-CM

## 2013-06-27 ENCOUNTER — Other Ambulatory Visit: Payer: Self-pay

## 2013-06-27 DIAGNOSIS — K859 Acute pancreatitis without necrosis or infection, unspecified: Secondary | ICD-10-CM

## 2013-07-01 ENCOUNTER — Other Ambulatory Visit: Payer: BC Managed Care – PPO

## 2013-07-01 ENCOUNTER — Ambulatory Visit
Admission: RE | Admit: 2013-07-01 | Discharge: 2013-07-01 | Disposition: A | Discharge status: Home / Self Care | Payer: BC Managed Care – PPO | Source: Ambulatory Visit

## 2013-07-01 DIAGNOSIS — K859 Acute pancreatitis without necrosis or infection, unspecified: Secondary | ICD-10-CM

## 2013-07-01 MED ORDER — IOHEXOL 350 MG/ML SOLN
100.0000 mL | Freq: Once | INTRAVENOUS | Status: AC | PRN
  Administered 2013-07-01: 100 mL via INTRAVENOUS

## 2013-07-03 ENCOUNTER — Other Ambulatory Visit: Payer: Self-pay

## 2013-07-03 DIAGNOSIS — N63 Unspecified lump in unspecified breast: Secondary | ICD-10-CM

## 2013-07-23 ENCOUNTER — Other Ambulatory Visit: Payer: BC Managed Care – PPO

## 2013-08-05 ENCOUNTER — Ambulatory Visit
Admission: RE | Admit: 2013-08-05 | Discharge: 2013-08-05 | Disposition: A | Discharge status: Home / Self Care | Payer: BC Managed Care – PPO | Source: Ambulatory Visit

## 2013-08-05 DIAGNOSIS — N63 Unspecified lump in unspecified breast: Secondary | ICD-10-CM

## 2013-09-23 DIAGNOSIS — K862 Cyst of pancreas: Secondary | ICD-10-CM | POA: Diagnosis not present

## 2013-09-23 DIAGNOSIS — E871 Hypo-osmolality and hyponatremia: Secondary | ICD-10-CM | POA: Diagnosis not present

## 2013-09-23 DIAGNOSIS — Z8719 Personal history of other diseases of the digestive system: Secondary | ICD-10-CM | POA: Diagnosis not present

## 2013-09-23 DIAGNOSIS — I10 Essential (primary) hypertension: Secondary | ICD-10-CM | POA: Diagnosis not present

## 2013-09-23 DIAGNOSIS — Z8673 Personal history of transient ischemic attack (TIA), and cerebral infarction without residual deficits: Secondary | ICD-10-CM | POA: Diagnosis not present

## 2013-10-14 ENCOUNTER — Other Ambulatory Visit: Payer: Self-pay | Admitting: Gastroenterology

## 2013-10-14 DIAGNOSIS — K862 Cyst of pancreas: Secondary | ICD-10-CM | POA: Diagnosis not present

## 2013-10-14 DIAGNOSIS — K861 Other chronic pancreatitis: Secondary | ICD-10-CM

## 2013-10-14 DIAGNOSIS — Z1211 Encounter for screening for malignant neoplasm of colon: Secondary | ICD-10-CM | POA: Diagnosis not present

## 2013-10-14 DIAGNOSIS — K859 Acute pancreatitis without necrosis or infection, unspecified: Secondary | ICD-10-CM | POA: Diagnosis not present

## 2013-12-19 ENCOUNTER — Ambulatory Visit
Admission: RE | Admit: 2013-12-19 | Discharge: 2013-12-19 | Disposition: A | Discharge status: Home / Self Care | Payer: BC Managed Care – PPO | Source: Ambulatory Visit | Attending: Gastroenterology | Admitting: Gastroenterology

## 2013-12-19 DIAGNOSIS — K8689 Other specified diseases of pancreas: Secondary | ICD-10-CM | POA: Diagnosis not present

## 2013-12-19 DIAGNOSIS — K861 Other chronic pancreatitis: Secondary | ICD-10-CM

## 2013-12-19 MED ORDER — GADOBENATE DIMEGLUMINE 529 MG/ML IV SOLN
14.0000 mL | Freq: Once | INTRAVENOUS | Status: AC | PRN
  Administered 2013-12-19: 14 mL via INTRAVENOUS

## 2013-12-25 ENCOUNTER — Encounter (HOSPITAL_COMMUNITY): Payer: Self-pay | Admitting: Pharmacy Technician

## 2013-12-25 DIAGNOSIS — R935 Abnormal findings on diagnostic imaging of other abdominal regions, including retroperitoneum: Secondary | ICD-10-CM | POA: Diagnosis not present

## 2013-12-26 ENCOUNTER — Encounter (HOSPITAL_COMMUNITY): Payer: Self-pay | Admitting: *Deleted

## 2014-01-06 NOTE — Anesthesia Preprocedure Evaluation (Addendum)
Anesthesia Evaluation  Patient identified by MRN, date of birth, ID band Patient awake    Reviewed: Allergy & Precautions, H&P , NPO status , Patient's Chart, lab work & pertinent test results  Airway Mallampati: II TM Distance: >3 FB Neck ROM: full    Dental  (+) Edentulous Upper, Dental Advisory Given   Pulmonary neg pulmonary ROS, former smoker,  breath sounds clear to auscultation  Pulmonary exam normal       Cardiovascular Exercise Tolerance: Good hypertension, Pt. on medications negative cardio ROS  Rhythm:regular Rate:Normal     Neuro/Psych CVA 2005 - mild left side deficits CVA, Residual Symptoms negative neurological ROS  negative psych ROS   GI/Hepatic negative GI ROS, Neg liver ROS,   Endo/Other  negative endocrine ROS  Renal/GU negative Renal ROS  negative genitourinary   Musculoskeletal   Abdominal   Peds  Hematology negative hematology ROS (+)   Anesthesia Other Findings   Reproductive/Obstetrics negative OB ROS                          Anesthesia Physical Anesthesia Plan  ASA: III  Anesthesia Plan: MAC   Post-op Pain Management:    Induction:   Airway Management Planned: Nasal Cannula  Additional Equipment:   Intra-op Plan:   Post-operative Plan:   Informed Consent: I have reviewed the patients History and Physical, chart, labs and discussed the procedure including the risks, benefits and alternatives for the proposed anesthesia with the patient or authorized representative who has indicated his/her understanding and acceptance.   Dental Advisory Given  Plan Discussed with: CRNA and Surgeon  Anesthesia Plan Comments:         Anesthesia Quick Evaluation

## 2014-01-07 ENCOUNTER — Other Ambulatory Visit: Payer: Self-pay | Admitting: Gastroenterology

## 2014-01-07 ENCOUNTER — Ambulatory Visit (HOSPITAL_COMMUNITY): Payer: BC Managed Care – PPO | Admitting: Anesthesiology

## 2014-01-07 ENCOUNTER — Encounter (HOSPITAL_COMMUNITY)
Admission: RE | Disposition: A | Discharge status: Home / Self Care | Payer: Self-pay | Source: Ambulatory Visit | Attending: Gastroenterology

## 2014-01-07 ENCOUNTER — Ambulatory Visit (HOSPITAL_COMMUNITY)
Admission: RE | Admit: 2014-01-07 | Discharge: 2014-01-07 | Disposition: A | Discharge status: Home / Self Care | Payer: BC Managed Care – PPO | Source: Ambulatory Visit | Attending: Gastroenterology | Admitting: Gastroenterology

## 2014-01-07 ENCOUNTER — Encounter (HOSPITAL_COMMUNITY): Payer: BC Managed Care – PPO | Admitting: Anesthesiology

## 2014-01-07 ENCOUNTER — Encounter (HOSPITAL_COMMUNITY): Payer: Self-pay

## 2014-01-07 DIAGNOSIS — K869 Disease of pancreas, unspecified: Secondary | ICD-10-CM | POA: Insufficient documentation

## 2014-01-07 DIAGNOSIS — Z8673 Personal history of transient ischemic attack (TIA), and cerebral infarction without residual deficits: Secondary | ICD-10-CM | POA: Insufficient documentation

## 2014-01-07 DIAGNOSIS — Z87891 Personal history of nicotine dependence: Secondary | ICD-10-CM | POA: Insufficient documentation

## 2014-01-07 DIAGNOSIS — K219 Gastro-esophageal reflux disease without esophagitis: Secondary | ICD-10-CM | POA: Diagnosis not present

## 2014-01-07 DIAGNOSIS — I10 Essential (primary) hypertension: Secondary | ICD-10-CM | POA: Insufficient documentation

## 2014-01-07 DIAGNOSIS — D136 Benign neoplasm of pancreas: Secondary | ICD-10-CM | POA: Diagnosis not present

## 2014-01-07 DIAGNOSIS — Z79899 Other long term (current) drug therapy: Secondary | ICD-10-CM | POA: Diagnosis not present

## 2014-01-07 DIAGNOSIS — D490 Neoplasm of unspecified behavior of digestive system: Secondary | ICD-10-CM | POA: Diagnosis not present

## 2014-01-07 DIAGNOSIS — R933 Abnormal findings on diagnostic imaging of other parts of digestive tract: Secondary | ICD-10-CM | POA: Diagnosis not present

## 2014-01-07 DIAGNOSIS — D379 Neoplasm of uncertain behavior of digestive organ, unspecified: Secondary | ICD-10-CM | POA: Diagnosis not present

## 2014-01-07 HISTORY — PX: EUS: SHX5427

## 2014-01-07 HISTORY — PX: FINE NEEDLE ASPIRATION: SHX5430

## 2014-01-07 LAB — CANCER ANTIGEN 19-9: CA 19-9: 3.2 U/mL — ABNORMAL LOW (ref <35.0)

## 2014-01-07 SURGERY — ESOPHAGEAL ENDOSCOPIC ULTRASOUND (EUS) RADIAL
Anesthesia: Monitor Anesthesia Care

## 2014-01-07 MED ORDER — LACTATED RINGERS IV SOLN
INTRAVENOUS | Status: DC
  Administered 2014-01-07: 1000 mL via INTRAVENOUS

## 2014-01-07 MED ORDER — SODIUM CHLORIDE 0.9 % IV SOLN: INTRAVENOUS | Status: DC

## 2014-01-07 MED ORDER — FENTANYL CITRATE 0.05 MG/ML IJ SOLN: 25.0000 ug | INTRAMUSCULAR | Status: DC | PRN

## 2014-01-07 MED ORDER — LIDOCAINE HCL (CARDIAC) 20 MG/ML IV SOLN
INTRAVENOUS | Status: DC | PRN
  Administered 2014-01-07: 100 mg via INTRAVENOUS

## 2014-01-07 MED ORDER — PROPOFOL INFUSION 10 MG/ML OPTIME
INTRAVENOUS | Status: DC | PRN
  Administered 2014-01-07: 140 ug/kg/min via INTRAVENOUS

## 2014-01-07 MED ORDER — ASPIRIN-DIPYRIDAMOLE ER 25-200 MG PO CP12: 1.0000 | ORAL_CAPSULE | Freq: Two times a day (BID) | ORAL | Status: DC

## 2014-01-07 MED ORDER — MIDAZOLAM HCL 5 MG/5ML IJ SOLN
INTRAMUSCULAR | Status: DC | PRN
  Administered 2014-01-07 (×2): 1 mg via INTRAVENOUS

## 2014-01-07 MED ORDER — FENTANYL CITRATE 0.05 MG/ML IJ SOLN
INTRAMUSCULAR | Status: DC | PRN
  Administered 2014-01-07 (×2): 50 ug via INTRAVENOUS

## 2014-01-07 MED ORDER — GLYCOPYRROLATE 0.2 MG/ML IJ SOLN
INTRAMUSCULAR | Status: DC | PRN
  Administered 2014-01-07: 0.2 mg via INTRAVENOUS

## 2014-01-07 NOTE — Discharge Instructions (Signed)
Gastrointestinal Endoscopy Care After Refer to this sheet in the next few weeks. These instructions provide you with information on caring for yourself after your procedure. Your caregiver may also give you more specific instructions. Your treatment has been planned according to current medical practices, but problems sometimes occur. Call your caregiver if you have any problems or questions after your procedure.  HOME CARE INSTRUCTIONS  If you were given medicine to help you relax (sedative), do not drive, operate machinery, or sign important documents for 24 hours.  Avoid alcohol and hot or warm beverages for the first 24 hours after the procedure.  Only take over-the-counter or prescription medicines for pain, discomfort, or fever as directed by your caregiver. You may resume taking your normal medicines unless your caregiver tells you otherwise. Ask your caregiver when you may resume taking medicines that may cause bleeding, such as aspirin, clopidogrel, or warfarin.  You may return to your normal diet and activities on the day after your procedure, or as directed by your caregiver. Walking may help to reduce any bloated feeling in your abdomen.  Drink enough fluids to keep your urine clear or pale yellow.  You may gargle with salt water if you have a sore throat.   SEEK IMMEDIATE MEDICAL CARE IF:  You have severe nausea or vomiting.  You have severe abdominal pain, abdominal cramps that last longer than 6 hours, or abdominal swelling (distention).  You have severe shoulder or back pain.  You have trouble swallowing.  You have shortness of breath, your breathing is shallow, or you are breathing faster than normal.  You have a fever or a rapid heartbeat.  You vomit blood or material that looks like coffee grounds.  You have bloody, black, or tarry stools.   MAKE SURE YOU:  Understand these instructions.  Will watch your condition.  Will get help right away if you are  not doing well or get worse.

## 2014-01-07 NOTE — Op Note (Signed)
[facility] [place], [address]   ENDOSCOPIC ULTRASOUND PROCEDURE REPORT  PATIENT: [name], [name]  MR#: [id number] BIRTHDATE: [birth year]  GENDER: Female ENDOSCOPIST: Arta Silence, MD REFERRED BY:  Arnetha Courser, M.D. PROCEDURE DATE:  01/07/2014 PROCEDURE:   Upper EUS w/FNA ASA CLASS:      Class III INDICATIONS:   1.  pancreatic lesion on MRI, pancreatic ductal dilatation. MEDICATIONS: MAC sedation, administered by CRNA  DESCRIPTION OF PROCEDURE:   After the risks benefits and alternatives of the procedure were  explained, informed consent was obtained. The patient was then placed in the left, lateral, decubitus postion and IV sedation was administered. Throughout the procedure, the patients blood pressure, pulse and oxygen saturations were monitored continuously.  Under direct visualization, the     endoscope was introduced through the mouth and advanced to the second portion of the duodenum .  Water was used as necessary to provide an acoustic interface.  Upon completion of the imaging, water was removed and the patient was sent to the recovery room in satisfactory condition.   FINDINGS:      Somewhat ectatic pancreatic duct in body and tail, but no significant pancreatic ductal dilatation.  Pancreatic parenchyma had some subtle hyperechoic and strands, but overall findings did not support chronic pancreatitis.  Very vague very mildly hypoechoic region in pancreatic head, neighboring portal confluence, measuring 20 x 59mm in size, with some upstream pancreatic ductal dilatation.  Area FNA biopsied x 2 with 25g needle; preliminary cytology supportive of benign pancreatic islet cells.  No peritumoral adenopathy.  I did not readily appreciate a "mass effect" on the portal vessels, as was suggested on MRI.  IMPRESSION:     As above.  EUS findings for lesion seen on MRI is most supportive of benign process, perhaps fibrotic sequelae from prior  pancreatitis, but very mild and very subtle malignant process can not be completely excluded. I did not discern a pancreatic/head cyst.  RECOMMENDATIONS:     1.  Watch for potential complications of procedure. 2.  Restart Aggrenox in 3 days. 3.  Await final cytology results. 4.  Consider Ca 19-9 level. 5.  If final cytology results are unrevealing, consider repeat MRI in 3-4 months for surveillance of lesion.   _______________________________ Lorrin MaisArta Silence, MD 01/07/2014 9:43 AM   CC:

## 2014-01-07 NOTE — Anesthesia Postprocedure Evaluation (Signed)
  Anesthesia Post-op Note  Patient: [name]  Procedure(s) Performed: Procedure(s) (LRB): ESOPHAGEAL ENDOSCOPIC ULTRASOUND (EUS) RADIAL (N/A) FINE NEEDLE ASPIRATION (FNA) LINEAR (N/A)  Patient Location: PACU  Anesthesia Type: MAC  Level of Consciousness: awake and alert   Airway and Oxygen Therapy: Patient Spontanous Breathing  Post-op Pain: mild  Post-op Assessment: Post-op Vital signs reviewed, Patient's Cardiovascular Status Stable, Respiratory Function Stable, Patent Airway and No signs of Nausea or vomiting  Last Vitals:  Filed Vitals:   01/07/14 0935  BP: 129/85  Temp:   Resp: 19    Post-op Vital Signs: stable   Complications: No apparent anesthesia complications

## 2014-01-07 NOTE — Preoperative (Signed)
Beta Blockers   Reason not to administer Beta Blockers:Not Applicable 

## 2014-01-07 NOTE — Transfer of Care (Signed)
Immediate Anesthesia Transfer of Care Note  Patient: [name]  Procedure(s) Performed: Procedure(s): ESOPHAGEAL ENDOSCOPIC ULTRASOUND (EUS) RADIAL (N/A) FINE NEEDLE ASPIRATION (FNA) LINEAR (N/A)  Patient Location: PACU  Anesthesia Type:MAC  Level of Consciousness: awake  Airway & Oxygen Therapy: Patient Spontanous Breathing and Patient connected to nasal cannula oxygen  Post-op Assessment: Report given to PACU RN and Post -op Vital signs reviewed and stable  Post vital signs: Reviewed and stable  Complications: No apparent anesthesia complications

## 2014-01-07 NOTE — H&P (Signed)
Patient interval history reviewed.  Patient examined again.  There has been no change from documented H/P dated 12/25/13 (scanned into chart from our office) except as documented above.  Assessment:  1.  Pancreatic cyst versus mass on MRI.  Plan:  1.  Endoscopic ultrasound with possible fine needle aspiration (FNA) biopsies. 2.  Risks (bleeding, infection, bowel perforation that could require surgery, sedation-related changes in cardiopulmonary systems), benefits (identification and possible treatment of source of symptoms, exclusion of certain causes of symptoms), and alternatives (watchful waiting, radiographic imaging studies, empiric medical treatment) of upper endoscopy with ultrasound and possible biopsies (EUS +/- FNA) were explained to patient/family in detail and patient wishes to proceed.

## 2014-01-07 NOTE — Addendum Note (Signed)
Addended by: Arta Silence on: 01/07/2014 08:39 AM   Modules accepted: Orders

## 2014-01-09 ENCOUNTER — Encounter (HOSPITAL_COMMUNITY): Payer: Self-pay | Admitting: Gastroenterology

## 2014-03-23 DIAGNOSIS — K862 Cyst of pancreas: Secondary | ICD-10-CM | POA: Diagnosis not present

## 2014-03-23 DIAGNOSIS — R12 Heartburn: Secondary | ICD-10-CM | POA: Diagnosis not present

## 2014-03-23 DIAGNOSIS — I10 Essential (primary) hypertension: Secondary | ICD-10-CM | POA: Diagnosis not present

## 2014-03-23 DIAGNOSIS — Z8673 Personal history of transient ischemic attack (TIA), and cerebral infarction without residual deficits: Secondary | ICD-10-CM | POA: Diagnosis not present

## 2014-08-20 ENCOUNTER — Other Ambulatory Visit: Payer: Self-pay | Admitting: Gastroenterology

## 2014-08-20 DIAGNOSIS — K8689 Other specified diseases of pancreas: Secondary | ICD-10-CM

## 2014-08-27 ENCOUNTER — Other Ambulatory Visit: Payer: BC Managed Care – PPO

## 2014-09-04 ENCOUNTER — Ambulatory Visit
Admission: RE | Admit: 2014-09-04 | Discharge: 2014-09-04 | Disposition: A | Discharge status: Home / Self Care | Payer: Medicare Other | Source: Ambulatory Visit | Attending: Gastroenterology | Admitting: Gastroenterology

## 2014-09-04 DIAGNOSIS — K868 Other specified diseases of pancreas: Secondary | ICD-10-CM | POA: Diagnosis not present

## 2014-09-04 DIAGNOSIS — K8689 Other specified diseases of pancreas: Secondary | ICD-10-CM

## 2014-09-04 MED ORDER — GADOBENATE DIMEGLUMINE 529 MG/ML IV SOLN
12.0000 mL | Freq: Once | INTRAVENOUS | Status: AC | PRN
  Administered 2014-09-04: 12 mL via INTRAVENOUS

## 2014-09-23 DIAGNOSIS — K862 Cyst of pancreas: Secondary | ICD-10-CM | POA: Diagnosis not present

## 2014-09-23 DIAGNOSIS — I10 Essential (primary) hypertension: Secondary | ICD-10-CM | POA: Diagnosis not present

## 2014-09-23 DIAGNOSIS — Z23 Encounter for immunization: Secondary | ICD-10-CM | POA: Diagnosis not present

## 2014-09-23 DIAGNOSIS — Z1211 Encounter for screening for malignant neoplasm of colon: Secondary | ICD-10-CM | POA: Diagnosis not present

## 2014-09-23 DIAGNOSIS — Z8673 Personal history of transient ischemic attack (TIA), and cerebral infarction without residual deficits: Secondary | ICD-10-CM | POA: Diagnosis not present

## 2014-10-06 DIAGNOSIS — R7309 Other abnormal glucose: Secondary | ICD-10-CM | POA: Diagnosis not present

## 2014-10-26 DIAGNOSIS — R935 Abnormal findings on diagnostic imaging of other abdominal regions, including retroperitoneum: Secondary | ICD-10-CM | POA: Diagnosis not present

## 2014-12-22 DIAGNOSIS — Z0000 Encounter for general adult medical examination without abnormal findings: Secondary | ICD-10-CM | POA: Diagnosis not present

## 2014-12-22 DIAGNOSIS — I10 Essential (primary) hypertension: Secondary | ICD-10-CM | POA: Diagnosis not present

## 2014-12-22 DIAGNOSIS — Z13220 Encounter for screening for lipoid disorders: Secondary | ICD-10-CM | POA: Diagnosis not present

## 2014-12-22 DIAGNOSIS — Z1211 Encounter for screening for malignant neoplasm of colon: Secondary | ICD-10-CM | POA: Diagnosis not present

## 2015-02-15 ENCOUNTER — Other Ambulatory Visit: Payer: Self-pay | Admitting: Gastroenterology

## 2015-02-15 DIAGNOSIS — R935 Abnormal findings on diagnostic imaging of other abdominal regions, including retroperitoneum: Secondary | ICD-10-CM

## 2015-02-15 DIAGNOSIS — K862 Cyst of pancreas: Secondary | ICD-10-CM

## 2015-02-25 ENCOUNTER — Ambulatory Visit
Admission: RE | Admit: 2015-02-25 | Discharge: 2015-02-25 | Disposition: A | Discharge status: Home / Self Care | Payer: BLUE CROSS/BLUE SHIELD | Source: Ambulatory Visit | Attending: Gastroenterology | Admitting: Gastroenterology

## 2015-02-25 DIAGNOSIS — K862 Cyst of pancreas: Secondary | ICD-10-CM

## 2015-02-25 DIAGNOSIS — R935 Abnormal findings on diagnostic imaging of other abdominal regions, including retroperitoneum: Secondary | ICD-10-CM

## 2015-02-25 DIAGNOSIS — K868 Other specified diseases of pancreas: Secondary | ICD-10-CM | POA: Diagnosis not present

## 2015-02-25 MED ORDER — GADOBENATE DIMEGLUMINE 529 MG/ML IV SOLN
10.0000 mL | Freq: Once | INTRAVENOUS | Status: AC | PRN
  Administered 2015-02-25: 10 mL via INTRAVENOUS

## 2015-04-30 DIAGNOSIS — R109 Unspecified abdominal pain: Secondary | ICD-10-CM | POA: Diagnosis not present

## 2015-06-17 ENCOUNTER — Other Ambulatory Visit: Payer: Self-pay | Admitting: Gastroenterology

## 2015-06-17 DIAGNOSIS — K862 Cyst of pancreas: Secondary | ICD-10-CM

## 2015-06-17 DIAGNOSIS — R935 Abnormal findings on diagnostic imaging of other abdominal regions, including retroperitoneum: Secondary | ICD-10-CM

## 2015-06-23 ENCOUNTER — Other Ambulatory Visit (HOSPITAL_COMMUNITY)
Admission: RE | Admit: 2015-06-23 | Discharge: 2015-06-23 | Disposition: A | Discharge status: Home / Self Care | Payer: BLUE CROSS/BLUE SHIELD | Source: Ambulatory Visit | Attending: Family Medicine | Admitting: Family Medicine

## 2015-06-23 ENCOUNTER — Other Ambulatory Visit: Payer: Self-pay | Admitting: Family Medicine

## 2015-06-23 DIAGNOSIS — N760 Acute vaginitis: Secondary | ICD-10-CM | POA: Diagnosis not present

## 2015-06-23 DIAGNOSIS — I10 Essential (primary) hypertension: Secondary | ICD-10-CM | POA: Diagnosis not present

## 2015-06-23 DIAGNOSIS — Z8673 Personal history of transient ischemic attack (TIA), and cerebral infarction without residual deficits: Secondary | ICD-10-CM | POA: Diagnosis not present

## 2015-06-23 DIAGNOSIS — Z01419 Encounter for gynecological examination (general) (routine) without abnormal findings: Secondary | ICD-10-CM | POA: Insufficient documentation

## 2015-06-23 DIAGNOSIS — R935 Abnormal findings on diagnostic imaging of other abdominal regions, including retroperitoneum: Secondary | ICD-10-CM | POA: Diagnosis not present

## 2015-06-23 DIAGNOSIS — Z124 Encounter for screening for malignant neoplasm of cervix: Secondary | ICD-10-CM | POA: Diagnosis not present

## 2015-06-23 DIAGNOSIS — N907 Vulvar cyst: Secondary | ICD-10-CM | POA: Diagnosis not present

## 2015-06-28 LAB — CYTOLOGY - PAP

## 2015-06-30 DIAGNOSIS — N907 Vulvar cyst: Secondary | ICD-10-CM | POA: Diagnosis not present

## 2015-11-25 ENCOUNTER — Ambulatory Visit
Admission: RE | Admit: 2015-11-25 | Discharge: 2015-11-25 | Disposition: A | Discharge status: Home / Self Care | Payer: BLUE CROSS/BLUE SHIELD | Source: Ambulatory Visit | Attending: Gastroenterology | Admitting: Gastroenterology

## 2015-11-25 DIAGNOSIS — K862 Cyst of pancreas: Secondary | ICD-10-CM

## 2015-11-25 DIAGNOSIS — R935 Abnormal findings on diagnostic imaging of other abdominal regions, including retroperitoneum: Secondary | ICD-10-CM

## 2015-11-25 MED ORDER — GADOBENATE DIMEGLUMINE 529 MG/ML IV SOLN
12.0000 mL | Freq: Once | INTRAVENOUS | Status: AC | PRN
  Administered 2015-11-25: 12 mL via INTRAVENOUS

## 2016-01-03 DIAGNOSIS — N907 Vulvar cyst: Secondary | ICD-10-CM | POA: Diagnosis not present

## 2016-01-04 ENCOUNTER — Other Ambulatory Visit: Payer: Self-pay

## 2016-01-04 DIAGNOSIS — Z1231 Encounter for screening mammogram for malignant neoplasm of breast: Secondary | ICD-10-CM

## 2016-01-05 DIAGNOSIS — D649 Anemia, unspecified: Secondary | ICD-10-CM | POA: Diagnosis not present

## 2016-01-05 DIAGNOSIS — Z8673 Personal history of transient ischemic attack (TIA), and cerebral infarction without residual deficits: Secondary | ICD-10-CM | POA: Diagnosis not present

## 2016-01-05 DIAGNOSIS — I10 Essential (primary) hypertension: Secondary | ICD-10-CM | POA: Diagnosis not present

## 2016-01-18 ENCOUNTER — Ambulatory Visit
Admission: RE | Admit: 2016-01-18 | Discharge: 2016-01-18 | Disposition: A | Discharge status: Home / Self Care | Payer: BLUE CROSS/BLUE SHIELD | Source: Ambulatory Visit

## 2016-01-18 DIAGNOSIS — Z1231 Encounter for screening mammogram for malignant neoplasm of breast: Secondary | ICD-10-CM

## 2016-05-18 DIAGNOSIS — Z1211 Encounter for screening for malignant neoplasm of colon: Secondary | ICD-10-CM | POA: Diagnosis not present

## 2016-05-18 DIAGNOSIS — Z01818 Encounter for other preprocedural examination: Secondary | ICD-10-CM | POA: Diagnosis not present

## 2016-05-18 DIAGNOSIS — R935 Abnormal findings on diagnostic imaging of other abdominal regions, including retroperitoneum: Secondary | ICD-10-CM | POA: Diagnosis not present

## 2016-06-12 DIAGNOSIS — Z1211 Encounter for screening for malignant neoplasm of colon: Secondary | ICD-10-CM | POA: Diagnosis not present

## 2016-06-12 DIAGNOSIS — Z1212 Encounter for screening for malignant neoplasm of rectum: Secondary | ICD-10-CM | POA: Diagnosis not present

## 2016-07-05 DIAGNOSIS — Z0000 Encounter for general adult medical examination without abnormal findings: Secondary | ICD-10-CM | POA: Diagnosis not present

## 2016-07-05 DIAGNOSIS — I10 Essential (primary) hypertension: Secondary | ICD-10-CM | POA: Diagnosis not present

## 2016-07-05 DIAGNOSIS — Z8673 Personal history of transient ischemic attack (TIA), and cerebral infarction without residual deficits: Secondary | ICD-10-CM | POA: Diagnosis not present

## 2016-11-02 DIAGNOSIS — H401131 Primary open-angle glaucoma, bilateral, mild stage: Secondary | ICD-10-CM | POA: Diagnosis not present

## 2016-12-14 DIAGNOSIS — H401131 Primary open-angle glaucoma, bilateral, mild stage: Secondary | ICD-10-CM | POA: Diagnosis not present

## 2017-01-03 ENCOUNTER — Other Ambulatory Visit: Payer: Self-pay | Admitting: Obstetrics and Gynecology

## 2017-01-03 ENCOUNTER — Other Ambulatory Visit (HOSPITAL_COMMUNITY)
Admission: RE | Admit: 2017-01-03 | Discharge: 2017-01-03 | Disposition: A | Discharge status: Home / Self Care | Payer: BLUE CROSS/BLUE SHIELD | Source: Ambulatory Visit | Attending: Obstetrics and Gynecology | Admitting: Obstetrics and Gynecology

## 2017-01-03 DIAGNOSIS — N907 Vulvar cyst: Secondary | ICD-10-CM | POA: Diagnosis not present

## 2017-01-03 DIAGNOSIS — Z01419 Encounter for gynecological examination (general) (routine) without abnormal findings: Secondary | ICD-10-CM | POA: Insufficient documentation

## 2017-01-03 DIAGNOSIS — Z1151 Encounter for screening for human papillomavirus (HPV): Secondary | ICD-10-CM | POA: Insufficient documentation

## 2017-01-03 DIAGNOSIS — Z01411 Encounter for gynecological examination (general) (routine) with abnormal findings: Secondary | ICD-10-CM | POA: Diagnosis not present

## 2017-01-05 DIAGNOSIS — I10 Essential (primary) hypertension: Secondary | ICD-10-CM | POA: Diagnosis not present

## 2017-01-05 DIAGNOSIS — Z8673 Personal history of transient ischemic attack (TIA), and cerebral infarction without residual deficits: Secondary | ICD-10-CM | POA: Diagnosis not present

## 2017-01-08 LAB — CYTOLOGY - PAP
Diagnosis: NEGATIVE
HPV (WINDOPATH): NOT DETECTED

## 2017-02-12 DIAGNOSIS — H401131 Primary open-angle glaucoma, bilateral, mild stage: Secondary | ICD-10-CM | POA: Diagnosis not present

## 2017-02-20 DIAGNOSIS — N907 Vulvar cyst: Secondary | ICD-10-CM | POA: Diagnosis not present

## 2017-02-20 DIAGNOSIS — Z202 Contact with and (suspected) exposure to infections with a predominantly sexual mode of transmission: Secondary | ICD-10-CM | POA: Diagnosis not present

## 2017-04-03 DIAGNOSIS — H401131 Primary open-angle glaucoma, bilateral, mild stage: Secondary | ICD-10-CM | POA: Diagnosis not present

## 2017-07-06 DIAGNOSIS — H401131 Primary open-angle glaucoma, bilateral, mild stage: Secondary | ICD-10-CM | POA: Diagnosis not present

## 2017-07-20 DIAGNOSIS — R634 Abnormal weight loss: Secondary | ICD-10-CM | POA: Diagnosis not present

## 2017-07-20 DIAGNOSIS — Z136 Encounter for screening for cardiovascular disorders: Secondary | ICD-10-CM | POA: Diagnosis not present

## 2017-07-20 DIAGNOSIS — I10 Essential (primary) hypertension: Secondary | ICD-10-CM | POA: Diagnosis not present

## 2017-07-20 DIAGNOSIS — Z23 Encounter for immunization: Secondary | ICD-10-CM | POA: Diagnosis not present

## 2017-07-20 DIAGNOSIS — Z0000 Encounter for general adult medical examination without abnormal findings: Secondary | ICD-10-CM | POA: Diagnosis not present

## 2017-07-20 DIAGNOSIS — Z1211 Encounter for screening for malignant neoplasm of colon: Secondary | ICD-10-CM | POA: Diagnosis not present

## 2017-07-20 DIAGNOSIS — R142 Eructation: Secondary | ICD-10-CM | POA: Diagnosis not present

## 2017-07-25 ENCOUNTER — Other Ambulatory Visit: Payer: Self-pay | Admitting: Gastroenterology

## 2017-07-25 DIAGNOSIS — G8929 Other chronic pain: Secondary | ICD-10-CM

## 2017-07-25 DIAGNOSIS — R109 Unspecified abdominal pain: Secondary | ICD-10-CM

## 2017-07-25 DIAGNOSIS — K8689 Other specified diseases of pancreas: Secondary | ICD-10-CM

## 2017-07-25 DIAGNOSIS — R748 Abnormal levels of other serum enzymes: Secondary | ICD-10-CM

## 2017-08-20 ENCOUNTER — Ambulatory Visit
Admission: RE | Admit: 2017-08-20 | Discharge: 2017-08-20 | Disposition: A | Discharge status: Home / Self Care | Payer: Medicare Other | Source: Ambulatory Visit | Attending: Gastroenterology | Admitting: Gastroenterology

## 2017-08-20 DIAGNOSIS — K8689 Other specified diseases of pancreas: Secondary | ICD-10-CM

## 2017-08-20 DIAGNOSIS — R109 Unspecified abdominal pain: Secondary | ICD-10-CM

## 2017-08-20 DIAGNOSIS — G8929 Other chronic pain: Secondary | ICD-10-CM

## 2017-08-20 DIAGNOSIS — R748 Abnormal levels of other serum enzymes: Secondary | ICD-10-CM | POA: Diagnosis not present

## 2017-08-20 DIAGNOSIS — R9389 Abnormal findings on diagnostic imaging of other specified body structures: Secondary | ICD-10-CM | POA: Diagnosis not present

## 2017-08-20 MED ORDER — GADOBENATE DIMEGLUMINE 529 MG/ML IV SOLN
10.0000 mL | Freq: Once | INTRAVENOUS | Status: AC | PRN
  Administered 2017-08-20: 10 mL via INTRAVENOUS

## 2017-08-30 ENCOUNTER — Other Ambulatory Visit: Payer: Self-pay | Admitting: Gastroenterology

## 2017-08-30 DIAGNOSIS — R109 Unspecified abdominal pain: Secondary | ICD-10-CM | POA: Diagnosis not present

## 2017-08-30 DIAGNOSIS — R935 Abnormal findings on diagnostic imaging of other abdominal regions, including retroperitoneum: Secondary | ICD-10-CM | POA: Diagnosis not present

## 2017-08-30 DIAGNOSIS — R1011 Right upper quadrant pain: Secondary | ICD-10-CM

## 2017-08-30 DIAGNOSIS — R195 Other fecal abnormalities: Secondary | ICD-10-CM | POA: Diagnosis not present

## 2017-09-03 ENCOUNTER — Ambulatory Visit
Admission: RE | Admit: 2017-09-03 | Discharge: 2017-09-03 | Disposition: A | Discharge status: Home / Self Care | Payer: BLUE CROSS/BLUE SHIELD | Source: Ambulatory Visit | Attending: Gastroenterology | Admitting: Gastroenterology

## 2017-09-03 DIAGNOSIS — K7689 Other specified diseases of liver: Secondary | ICD-10-CM | POA: Diagnosis not present

## 2017-09-03 DIAGNOSIS — R1011 Right upper quadrant pain: Secondary | ICD-10-CM

## 2017-10-09 DIAGNOSIS — K861 Other chronic pancreatitis: Secondary | ICD-10-CM | POA: Diagnosis not present

## 2017-11-01 DIAGNOSIS — K861 Other chronic pancreatitis: Secondary | ICD-10-CM | POA: Diagnosis not present

## 2017-11-01 DIAGNOSIS — R195 Other fecal abnormalities: Secondary | ICD-10-CM | POA: Diagnosis not present

## 2017-12-31 ENCOUNTER — Other Ambulatory Visit: Payer: Self-pay | Admitting: Family Medicine

## 2017-12-31 DIAGNOSIS — Z1231 Encounter for screening mammogram for malignant neoplasm of breast: Secondary | ICD-10-CM

## 2018-01-07 DIAGNOSIS — Z01411 Encounter for gynecological examination (general) (routine) with abnormal findings: Secondary | ICD-10-CM | POA: Diagnosis not present

## 2018-01-14 DIAGNOSIS — H401131 Primary open-angle glaucoma, bilateral, mild stage: Secondary | ICD-10-CM | POA: Diagnosis not present

## 2018-01-14 DIAGNOSIS — H2513 Age-related nuclear cataract, bilateral: Secondary | ICD-10-CM | POA: Diagnosis not present

## 2018-01-17 ENCOUNTER — Ambulatory Visit
Admission: RE | Admit: 2018-01-17 | Discharge: 2018-01-17 | Disposition: A | Discharge status: Home / Self Care | Payer: BLUE CROSS/BLUE SHIELD | Source: Ambulatory Visit | Attending: Family Medicine | Admitting: Family Medicine

## 2018-01-17 DIAGNOSIS — Z1231 Encounter for screening mammogram for malignant neoplasm of breast: Secondary | ICD-10-CM

## 2018-01-17 DIAGNOSIS — I10 Essential (primary) hypertension: Secondary | ICD-10-CM | POA: Diagnosis not present

## 2018-01-17 DIAGNOSIS — Z8673 Personal history of transient ischemic attack (TIA), and cerebral infarction without residual deficits: Secondary | ICD-10-CM | POA: Diagnosis not present

## 2018-01-17 DIAGNOSIS — K862 Cyst of pancreas: Secondary | ICD-10-CM | POA: Diagnosis not present

## 2018-02-14 DIAGNOSIS — K861 Other chronic pancreatitis: Secondary | ICD-10-CM | POA: Diagnosis not present

## 2018-02-14 DIAGNOSIS — R195 Other fecal abnormalities: Secondary | ICD-10-CM | POA: Diagnosis not present

## 2018-03-14 DIAGNOSIS — K5730 Diverticulosis of large intestine without perforation or abscess without bleeding: Secondary | ICD-10-CM | POA: Diagnosis not present

## 2018-03-14 DIAGNOSIS — D126 Benign neoplasm of colon, unspecified: Secondary | ICD-10-CM | POA: Diagnosis not present

## 2018-03-14 DIAGNOSIS — K648 Other hemorrhoids: Secondary | ICD-10-CM | POA: Diagnosis not present

## 2018-03-14 DIAGNOSIS — R195 Other fecal abnormalities: Secondary | ICD-10-CM | POA: Diagnosis not present

## 2018-03-19 DIAGNOSIS — D126 Benign neoplasm of colon, unspecified: Secondary | ICD-10-CM | POA: Diagnosis not present

## 2018-04-01 DIAGNOSIS — N907 Vulvar cyst: Secondary | ICD-10-CM | POA: Diagnosis not present

## 2018-04-12 DIAGNOSIS — H401131 Primary open-angle glaucoma, bilateral, mild stage: Secondary | ICD-10-CM | POA: Diagnosis not present

## 2018-08-01 DIAGNOSIS — Z8673 Personal history of transient ischemic attack (TIA), and cerebral infarction without residual deficits: Secondary | ICD-10-CM | POA: Diagnosis not present

## 2018-08-01 DIAGNOSIS — I10 Essential (primary) hypertension: Secondary | ICD-10-CM | POA: Diagnosis not present

## 2018-08-01 DIAGNOSIS — K862 Cyst of pancreas: Secondary | ICD-10-CM | POA: Diagnosis not present

## 2018-08-01 DIAGNOSIS — D473 Essential (hemorrhagic) thrombocythemia: Secondary | ICD-10-CM | POA: Diagnosis not present

## 2018-08-01 DIAGNOSIS — Z0000 Encounter for general adult medical examination without abnormal findings: Secondary | ICD-10-CM | POA: Diagnosis not present

## 2018-08-20 DIAGNOSIS — K861 Other chronic pancreatitis: Secondary | ICD-10-CM | POA: Diagnosis not present

## 2018-08-20 DIAGNOSIS — Z86010 Personal history of colonic polyps: Secondary | ICD-10-CM | POA: Diagnosis not present

## 2018-08-20 DIAGNOSIS — R935 Abnormal findings on diagnostic imaging of other abdominal regions, including retroperitoneum: Secondary | ICD-10-CM | POA: Diagnosis not present

## 2018-08-23 ENCOUNTER — Other Ambulatory Visit: Payer: Self-pay | Admitting: Gastroenterology

## 2018-08-23 DIAGNOSIS — K862 Cyst of pancreas: Secondary | ICD-10-CM

## 2018-08-23 DIAGNOSIS — K838 Other specified diseases of biliary tract: Secondary | ICD-10-CM

## 2018-08-23 DIAGNOSIS — K7689 Other specified diseases of liver: Secondary | ICD-10-CM

## 2018-09-03 ENCOUNTER — Ambulatory Visit
Admission: RE | Admit: 2018-09-03 | Discharge: 2018-09-03 | Disposition: A | Discharge status: Home / Self Care | Payer: BLUE CROSS/BLUE SHIELD | Source: Ambulatory Visit | Attending: Gastroenterology | Admitting: Gastroenterology

## 2018-09-03 DIAGNOSIS — K7689 Other specified diseases of liver: Secondary | ICD-10-CM

## 2018-09-03 DIAGNOSIS — K838 Other specified diseases of biliary tract: Secondary | ICD-10-CM

## 2018-09-03 DIAGNOSIS — K8689 Other specified diseases of pancreas: Secondary | ICD-10-CM | POA: Diagnosis not present

## 2018-09-03 DIAGNOSIS — K862 Cyst of pancreas: Secondary | ICD-10-CM

## 2018-09-03 DIAGNOSIS — K769 Liver disease, unspecified: Secondary | ICD-10-CM | POA: Diagnosis not present

## 2018-09-03 MED ORDER — GADOBENATE DIMEGLUMINE 529 MG/ML IV SOLN
10.0000 mL | Freq: Once | INTRAVENOUS | Status: AC | PRN
  Administered 2018-09-03: 10 mL via INTRAVENOUS

## 2018-11-22 DIAGNOSIS — H401131 Primary open-angle glaucoma, bilateral, mild stage: Secondary | ICD-10-CM | POA: Diagnosis not present

## 2019-01-30 DIAGNOSIS — K862 Cyst of pancreas: Secondary | ICD-10-CM | POA: Diagnosis not present

## 2019-01-30 DIAGNOSIS — I10 Essential (primary) hypertension: Secondary | ICD-10-CM | POA: Diagnosis not present

## 2019-01-30 DIAGNOSIS — Z8673 Personal history of transient ischemic attack (TIA), and cerebral infarction without residual deficits: Secondary | ICD-10-CM | POA: Diagnosis not present

## 2019-01-30 DIAGNOSIS — K219 Gastro-esophageal reflux disease without esophagitis: Secondary | ICD-10-CM | POA: Diagnosis not present

## 2019-04-18 DIAGNOSIS — H2513 Age-related nuclear cataract, bilateral: Secondary | ICD-10-CM | POA: Diagnosis not present

## 2019-04-18 DIAGNOSIS — H401131 Primary open-angle glaucoma, bilateral, mild stage: Secondary | ICD-10-CM | POA: Diagnosis not present

## 2019-07-10 DIAGNOSIS — Z1211 Encounter for screening for malignant neoplasm of colon: Secondary | ICD-10-CM | POA: Diagnosis not present

## 2019-07-10 DIAGNOSIS — Z1212 Encounter for screening for malignant neoplasm of rectum: Secondary | ICD-10-CM | POA: Diagnosis not present

## 2019-07-17 DIAGNOSIS — Z01419 Encounter for gynecological examination (general) (routine) without abnormal findings: Secondary | ICD-10-CM | POA: Diagnosis not present

## 2019-08-27 DIAGNOSIS — Z8673 Personal history of transient ischemic attack (TIA), and cerebral infarction without residual deficits: Secondary | ICD-10-CM | POA: Diagnosis not present

## 2019-08-27 DIAGNOSIS — Z0000 Encounter for general adult medical examination without abnormal findings: Secondary | ICD-10-CM | POA: Diagnosis not present

## 2019-08-27 DIAGNOSIS — K861 Other chronic pancreatitis: Secondary | ICD-10-CM | POA: Diagnosis not present

## 2019-09-04 DIAGNOSIS — K861 Other chronic pancreatitis: Secondary | ICD-10-CM | POA: Diagnosis not present

## 2019-09-04 DIAGNOSIS — R935 Abnormal findings on diagnostic imaging of other abdominal regions, including retroperitoneum: Secondary | ICD-10-CM | POA: Diagnosis not present

## 2019-09-04 DIAGNOSIS — Z86010 Personal history of colonic polyps: Secondary | ICD-10-CM | POA: Diagnosis not present

## 2019-09-10 DIAGNOSIS — Z0000 Encounter for general adult medical examination without abnormal findings: Secondary | ICD-10-CM | POA: Diagnosis not present

## 2019-09-10 DIAGNOSIS — I10 Essential (primary) hypertension: Secondary | ICD-10-CM | POA: Diagnosis not present

## 2019-09-10 DIAGNOSIS — K861 Other chronic pancreatitis: Secondary | ICD-10-CM | POA: Diagnosis not present

## 2019-09-10 DIAGNOSIS — Z8673 Personal history of transient ischemic attack (TIA), and cerebral infarction without residual deficits: Secondary | ICD-10-CM | POA: Diagnosis not present

## 2019-09-11 DIAGNOSIS — E875 Hyperkalemia: Secondary | ICD-10-CM | POA: Diagnosis not present

## 2020-04-01 ENCOUNTER — Other Ambulatory Visit: Payer: Self-pay | Admitting: Family Medicine

## 2020-04-01 DIAGNOSIS — Z1231 Encounter for screening mammogram for malignant neoplasm of breast: Secondary | ICD-10-CM

## 2020-07-23 ENCOUNTER — Emergency Department (HOSPITAL_COMMUNITY): Payer: BC Managed Care – PPO

## 2020-07-23 ENCOUNTER — Inpatient Hospital Stay (HOSPITAL_COMMUNITY): Payer: BC Managed Care – PPO

## 2020-07-23 ENCOUNTER — Other Ambulatory Visit: Payer: Self-pay

## 2020-07-23 ENCOUNTER — Encounter (HOSPITAL_COMMUNITY): Payer: Self-pay | Admitting: *Deleted

## 2020-07-23 ENCOUNTER — Inpatient Hospital Stay (HOSPITAL_COMMUNITY)
Admission: EM | Admit: 2020-07-23 | Discharge: 2020-07-26 | DRG: 871 | Disposition: A | Discharge status: Home / Self Care | Payer: BC Managed Care – PPO | Attending: Internal Medicine | Admitting: Internal Medicine

## 2020-07-23 DIAGNOSIS — R000 Tachycardia, unspecified: Secondary | ICD-10-CM | POA: Diagnosis not present

## 2020-07-23 DIAGNOSIS — I63413 Cerebral infarction due to embolism of bilateral middle cerebral arteries: Secondary | ICD-10-CM | POA: Diagnosis present

## 2020-07-23 DIAGNOSIS — Z87891 Personal history of nicotine dependence: Secondary | ICD-10-CM | POA: Diagnosis not present

## 2020-07-23 DIAGNOSIS — D649 Anemia, unspecified: Secondary | ICD-10-CM | POA: Diagnosis not present

## 2020-07-23 DIAGNOSIS — Z538 Procedure and treatment not carried out for other reasons: Secondary | ICD-10-CM | POA: Diagnosis not present

## 2020-07-23 DIAGNOSIS — E43 Unspecified severe protein-calorie malnutrition: Secondary | ICD-10-CM

## 2020-07-23 DIAGNOSIS — I10 Essential (primary) hypertension: Secondary | ICD-10-CM | POA: Diagnosis not present

## 2020-07-23 DIAGNOSIS — E871 Hypo-osmolality and hyponatremia: Secondary | ICD-10-CM | POA: Diagnosis not present

## 2020-07-23 DIAGNOSIS — R634 Abnormal weight loss: Secondary | ICD-10-CM | POA: Diagnosis not present

## 2020-07-23 DIAGNOSIS — E785 Hyperlipidemia, unspecified: Secondary | ICD-10-CM | POA: Diagnosis present

## 2020-07-23 DIAGNOSIS — A419 Sepsis, unspecified organism: Secondary | ICD-10-CM

## 2020-07-23 DIAGNOSIS — Z6823 Body mass index (BMI) 23.0-23.9, adult: Secondary | ICD-10-CM

## 2020-07-23 DIAGNOSIS — R531 Weakness: Secondary | ICD-10-CM | POA: Diagnosis not present

## 2020-07-23 DIAGNOSIS — R29818 Other symptoms and signs involving the nervous system: Secondary | ICD-10-CM | POA: Diagnosis not present

## 2020-07-23 DIAGNOSIS — Z01419 Encounter for gynecological examination (general) (routine) without abnormal findings: Secondary | ICD-10-CM | POA: Diagnosis not present

## 2020-07-23 DIAGNOSIS — I639 Cerebral infarction, unspecified: Secondary | ICD-10-CM | POA: Diagnosis present

## 2020-07-23 DIAGNOSIS — G9341 Metabolic encephalopathy: Secondary | ICD-10-CM

## 2020-07-23 DIAGNOSIS — Z20822 Contact with and (suspected) exposure to covid-19: Secondary | ICD-10-CM | POA: Diagnosis present

## 2020-07-23 DIAGNOSIS — E876 Hypokalemia: Secondary | ICD-10-CM | POA: Diagnosis present

## 2020-07-23 DIAGNOSIS — H53462 Homonymous bilateral field defects, left side: Secondary | ICD-10-CM | POA: Diagnosis not present

## 2020-07-23 DIAGNOSIS — K219 Gastro-esophageal reflux disease without esophagitis: Secondary | ICD-10-CM | POA: Diagnosis not present

## 2020-07-23 DIAGNOSIS — Z79899 Other long term (current) drug therapy: Secondary | ICD-10-CM | POA: Diagnosis not present

## 2020-07-23 DIAGNOSIS — I6389 Other cerebral infarction: Secondary | ICD-10-CM | POA: Diagnosis not present

## 2020-07-23 DIAGNOSIS — D500 Iron deficiency anemia secondary to blood loss (chronic): Secondary | ICD-10-CM

## 2020-07-23 DIAGNOSIS — J189 Pneumonia, unspecified organism: Secondary | ICD-10-CM | POA: Diagnosis not present

## 2020-07-23 DIAGNOSIS — R739 Hyperglycemia, unspecified: Secondary | ICD-10-CM | POA: Diagnosis present

## 2020-07-23 LAB — CBC
HCT: 25.6 % — ABNORMAL LOW (ref 36.0–46.0)
Hemoglobin: 7.9 g/dL — ABNORMAL LOW (ref 12.0–15.0)
MCH: 24.5 pg — ABNORMAL LOW (ref 26.0–34.0)
MCHC: 30.9 g/dL (ref 30.0–36.0)
MCV: 79.3 fL — ABNORMAL LOW (ref 80.0–100.0)
Platelets: 519 10*3/uL — ABNORMAL HIGH (ref 150–400)
RBC: 3.23 MIL/uL — ABNORMAL LOW (ref 3.87–5.11)
RDW: 17.2 % — ABNORMAL HIGH (ref 11.5–15.5)
WBC: 13.9 10*3/uL — ABNORMAL HIGH (ref 4.0–10.5)
nRBC: 0 % (ref 0.0–0.2)

## 2020-07-23 LAB — CBG MONITORING, ED: Glucose-Capillary: 126 mg/dL — ABNORMAL HIGH (ref 70–99)

## 2020-07-23 LAB — DIFFERENTIAL
Abs Immature Granulocytes: 0 10*3/uL (ref 0.00–0.07)
Basophils Absolute: 0 10*3/uL (ref 0.0–0.1)
Basophils Relative: 0 %
Eosinophils Absolute: 0 10*3/uL (ref 0.0–0.5)
Eosinophils Relative: 0 %
Lymphocytes Relative: 8 %
Lymphs Abs: 1.1 10*3/uL (ref 0.7–4.0)
Monocytes Absolute: 0.7 10*3/uL (ref 0.1–1.0)
Monocytes Relative: 5 %
Neutro Abs: 12.1 10*3/uL — ABNORMAL HIGH (ref 1.7–7.7)
Neutrophils Relative %: 87 %
nRBC: 0 /100 WBC

## 2020-07-23 LAB — PROTIME-INR
INR: 1.2 (ref 0.8–1.2)
Prothrombin Time: 14.3 seconds (ref 11.4–15.2)

## 2020-07-23 LAB — COMPREHENSIVE METABOLIC PANEL
ALT: 6 U/L (ref 0–44)
AST: 9 U/L — ABNORMAL LOW (ref 15–41)
Albumin: 1.9 g/dL — ABNORMAL LOW (ref 3.5–5.0)
Alkaline Phosphatase: 87 U/L (ref 38–126)
Anion gap: 11 (ref 5–15)
BUN: 7 mg/dL — ABNORMAL LOW (ref 8–23)
CO2: 22 mmol/L (ref 22–32)
Calcium: 8.8 mg/dL — ABNORMAL LOW (ref 8.9–10.3)
Chloride: 99 mmol/L (ref 98–111)
Creatinine, Ser: 0.76 mg/dL (ref 0.44–1.00)
GFR calc Af Amer: >60 mL/min (ref >60)
GFR calc non Af Amer: >60 mL/min (ref >60)
Glucose, Bld: 154 mg/dL — ABNORMAL HIGH (ref 70–99)
Potassium: 3.5 mmol/L (ref 3.5–5.1)
Sodium: 132 mmol/L — ABNORMAL LOW (ref 135–145)
Total Bilirubin: 0.5 mg/dL (ref 0.3–1.2)
Total Protein: 7.7 g/dL (ref 6.5–8.1)

## 2020-07-23 LAB — ETHANOL: Alcohol, Ethyl (B): <10 mg/dL (ref <10)

## 2020-07-23 LAB — APTT: aPTT: 40 seconds — ABNORMAL HIGH (ref 24–36)

## 2020-07-23 LAB — LACTIC ACID, PLASMA: Lactic Acid, Venous: 1.5 mmol/L (ref 0.5–1.9)

## 2020-07-23 LAB — SARS CORONAVIRUS 2 BY RT PCR (HOSPITAL ORDER, PERFORMED IN ~~LOC~~ HOSPITAL LAB): SARS Coronavirus 2: NEGATIVE

## 2020-07-23 LAB — POC OCCULT BLOOD, ED: Fecal Occult Bld: NEGATIVE

## 2020-07-23 MED ORDER — ENOXAPARIN SODIUM 40 MG/0.4ML ~~LOC~~ SOLN
40.0000 mg | SUBCUTANEOUS | Status: DC
  Administered 2020-07-23 – 2020-07-25 (×3): 40 mg via SUBCUTANEOUS
  Filled 2020-07-23 (×3): qty 0.4

## 2020-07-23 MED ORDER — ATORVASTATIN CALCIUM 80 MG PO TABS
80.0000 mg | ORAL_TABLET | Freq: Every day | ORAL | Status: DC
  Administered 2020-07-23 – 2020-07-26 (×4): 80 mg via ORAL
  Filled 2020-07-23 (×4): qty 1

## 2020-07-23 MED ORDER — INSULIN ASPART 100 UNIT/ML ~~LOC~~ SOLN: 0.0000 [IU] | SUBCUTANEOUS | Status: DC

## 2020-07-23 MED ORDER — PANTOPRAZOLE SODIUM 40 MG PO TBEC
40.0000 mg | DELAYED_RELEASE_TABLET | Freq: Every day | ORAL | Status: DC
  Administered 2020-07-23 – 2020-07-26 (×4): 40 mg via ORAL
  Filled 2020-07-23 (×4): qty 1

## 2020-07-23 MED ORDER — LACTATED RINGERS IV BOLUS (SEPSIS): 1000.0000 mL | Freq: Once | INTRAVENOUS | Status: DC

## 2020-07-23 MED ORDER — SODIUM CHLORIDE 0.9 % IV SOLN: INTRAVENOUS | Status: AC

## 2020-07-23 MED ORDER — SODIUM CHLORIDE 0.9 % IV SOLN
500.0000 mg | INTRAVENOUS | Status: DC
  Administered 2020-07-23 – 2020-07-25 (×3): 500 mg via INTRAVENOUS
  Filled 2020-07-23 (×4): qty 500

## 2020-07-23 MED ORDER — SODIUM CHLORIDE 0.9 % IV SOLN
2.0000 g | INTRAVENOUS | Status: DC
  Administered 2020-07-23 – 2020-07-25 (×3): 2 g via INTRAVENOUS
  Filled 2020-07-23: qty 2
  Filled 2020-07-23 (×2): qty 20
  Filled 2020-07-23: qty 2

## 2020-07-23 MED ORDER — ASPIRIN 300 MG RE SUPP: 300.0000 mg | Freq: Every day | RECTAL | Status: DC

## 2020-07-23 MED ORDER — SODIUM CHLORIDE 0.9 % IV BOLUS
2000.0000 mL | Freq: Once | INTRAVENOUS | Status: AC
  Administered 2020-07-23: 2000 mL via INTRAVENOUS

## 2020-07-23 MED ORDER — ONDANSETRON HCL 4 MG/2ML IJ SOLN: 4.0000 mg | Freq: Four times a day (QID) | INTRAMUSCULAR | Status: DC | PRN

## 2020-07-23 MED ORDER — ASPIRIN EC 325 MG PO TBEC
325.0000 mg | DELAYED_RELEASE_TABLET | Freq: Every day | ORAL | Status: DC
  Administered 2020-07-23 – 2020-07-26 (×4): 325 mg via ORAL
  Filled 2020-07-23 (×4): qty 1

## 2020-07-23 MED ORDER — ACETAMINOPHEN 325 MG PO TABS: 650.0000 mg | ORAL_TABLET | Freq: Four times a day (QID) | ORAL | Status: DC | PRN

## 2020-07-23 MED ORDER — LEVALBUTEROL HCL 0.63 MG/3ML IN NEBU: 0.6300 mg | INHALATION_SOLUTION | Freq: Four times a day (QID) | RESPIRATORY_TRACT | Status: DC | PRN

## 2020-07-23 MED ORDER — DM-GUAIFENESIN ER 30-600 MG PO TB12
2.0000 | ORAL_TABLET | Freq: Two times a day (BID) | ORAL | Status: DC
  Administered 2020-07-23 – 2020-07-26 (×6): 2 via ORAL
  Filled 2020-07-23 (×7): qty 2

## 2020-07-23 NOTE — Consult Note (Addendum)
Neurology Consultation Reason for Consult: concern for stroke Referring Physician: Isla Pence   CC: "I feel fine"  History is obtained from: Patient and chart review (attempted but unable to reach husband  HPI: [name] is a 59 y.o. 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  When pressed on specific review of systems questions, she does endorse a cough for the last several days, not wanting to drink her Ensure for the last week or so resulting in increased weight loss, but she has not noticed having a fever.  She denies any other symptoms including headache, difficulty seeing (double vision or loss of vision), difficulty with hearing, nausea, vomiting, diarrhea, incontinence of stool or urine, tongue biting, new focal weakness or numbness.  Significant caveat to this review of systems given her disorientation, though she was able to state that she is here because her husband is concerned about her walking even though she does not have any concerns.  Regarding her stroke history, she was seen by Dr. Leonie Man in 2006. Workup was notable for elevated homocysteine and hypercoagulable panel was sent though results are difficult for me to access at this time.  On chart review her weight loss was noted by her PCP as early as 2008 and was attributed to her stroke (altered taste after her stroke and lack of desire to eat)  LKW: 9/10 evening tPA given?: No, due to out of the window  Past Medical History:  Diagnosis Date  . 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 Cerebrovascular accident (stroke) ([place]) 2005  . Dental caries 01/27/2008  .  GERD (gastroesophageal reflux disease) 11/27/2006  . Hyperhomocysteinemia ([place]) 11/23/2006   history of mild  . Hypertension 11/23/2006    Family History  Problem Relation Age of Onset  . Hypertension Mother   . Hypertension Daughter      Social History:  reports that she quit smoking about 6 years ago. Her smoking use included cigarettes. She has a 5.00 pack-year smoking history. She has never used smokeless tobacco. She reports current alcohol use. She reports that she does not use drugs.   Exam: Current vital signs: BP (!) 153/88   Pulse (!) 121   Temp (!) 102.9 F (39.4 C) (Oral)   Resp (!) 26   SpO2 97%  Vital signs in last 24 hours: Temp:  [102.9 F (39.4 C)] 102.9 F (39.4 C) (09/10 1606) Pulse Rate:  [112-142] 112 (09/10 2115) Resp:  [22-27] 24 (09/10 2115) BP: (124-141)/(80-86) 141/86 (09/10 2115) SpO2:  [96 %-100 %] 100 % (09/10 2115)   Physical Exam @ 10:30 PM on 9/10  Constitutional: Appears well-developed but severely malnourished Psych: Affect overly unconcerned. Calm and cooperative  Eyes: No scleral injection HENT: No OP obstruction. No nuchal rigidity.  MSK: no joint deformities.  Cardiovascular: Tachycardic and regular rhythm.  Respiratory: Effort normal, non-labored breathing at rest but tachypeanic with minimal exertion. Intermittent cough  GI: Soft.  No distension. There is no tenderness.  Skin: WDI  Neuro: Mental Status: Patient is awake, alert, but disoriented (states she was told she is at [facility], that it's Nov 2001), but knows she is here because husband was concerned about worsening  weakness (which she disagrees with) Patient gives limited history. Subtle left-sided neglect intermittently. Able to name, repeat, follow multistep commands, name days of the week backwards quickly and correctly.  Does have difficulty with following neuromuscular testing, requiring repeated instructions  Cranial Nerves: II: Visual Fields appear full but may  have a well-compensated left-hemianopia. Pupils are equal, round, and reactive to light. 4 mm to 2 mm III,IV, VI: EOMI without ptosis or diploplia.  V: Facial sensation is symmetric to light touch VII: Facial movement is notable for a right droop.  VIII: Hearing is intact to voice X: Uvula elevates symmetrically XI: Shoulder shrug is symmetric, weak head turn to the right XII: Tongue is midline without atrophy or fasciculations.  Motor: Tone is slightly increased in the right arm and leg. Bulk is normal. Strength exam a bit effort dependent / requires coaching to follow directions. With that limitation, possible mild 4/5 tricep weakness bilaterally, 4/5 right hip, 4(-)/5 left hip, 5/5 knee extension and 4/5 knee flexion bilaterally Sensory: Sensation is symmetric to light touch and temperature in the arms and legs. Deep Tendon Reflexes: Brisker on the left than the right in the biceps, brachioradialis and patella (3+ left vs. 2+ right), crossed adductors bilaterally, clonus 4 beats on the left foot, no clonus on the right Plantars: Toes are downgoing bilaterally. Cerebellar: FNF and HKS are intact bilaterally Gait Able to rise on toes but not heels, casual gait slightly wide based but steady. Does not follow repeated instructions for tandem gait.    I have reviewed labs in epic and the results pertinent to this consultation are: Hyponatremic to 132,  Leukocytosis to 13.9 Anemia of 7.9  Hypoalbuminemia of 1.9  Cr 0.76  TSH 0.613 A1c 6.1  Lab Results  Component Value Date   CHOL 119 07/23/2020   HDL 59 07/23/2020   LDLCALC 41 07/23/2020   TRIG 95 07/23/2020   CHOLHDL 2.0 07/23/2020    I have reviewed the images obtained: HCT 07/23/20 with chronic R MCA infarct, chronic microvascular disease, age-indeterminant hypodensity in the left frontal lobe  MRI brain with acute infarcts; largest in the in the left frontal lobe, scattered punctate infarcts in the left parietal lobe, left  occipital lobe, and the right frontal lobe; no hemorrhagic conversion of any of these infarcts MRA; mild stenosis of the left vertebral artery versus right dominant vertebral artery, with mild irregularities throughout suggestive of atherosclerosis.   Impression: This is a 59 year old woman with past medical history significant for smoking, hypertension, and prior right MCA territory stroke of unclear etiology presenting with fever and worsening weakness.  In addition to chronic findings on her examination consistent with her prior stroke, she does appear to have some new right lower extremity weakness in an upper motor neuron pattern.  She also has some cognitive impairment that may be secondary to generalized illness, which could also be leading to some decompensation from her prior stroke (recrudescence).  Nutritional deficiencies could be contributing to her mental status.  Given the pattern of strokes on her MRI demonstrates multiple vascular territories involved (left MCA, left PCA, right MCA or ACA), highly concerning for a central embolic source (either aortic arch or cardioembolic).  In the setting of her fever, the possibility of endocarditis must be considered, though at this time really seems like her most likely source. DVT prophylaxis is not contraindicated.  # Anemia # Weight loss # Leukocytosis # Cough # Worsening weakness   Recommendations:  # Multifocal strokes, most prominent  in the left frontal lobe - Stroke labs TSH, HgbA1c, fasting lipid panel - MRI brain completed as above - ecmd:rep?[id number]^   [id number] MRA of the brain without contrast and MRA neck w/wo completed as above - Frequent neuro checks (q4hr at this time) - Echocardiogram -- if no clear source of infection found, and TTE negative would strongly consider TEE to definitively rule out endocarditis  - Follow up blood cultures - Carotid dopplers - Aspirin 325 mg followed by 81 mg daily  -  Atorvastatin not indicated given LDL is 41 - Risk factor modification - Telemetry monitoring; 30 day event monitor on discharge if no arrythmias captured  - PT consult, OT consult, Speech consult, unless patient is back to baseline - Stroke team to follow  # Hypertension -Normotension goal given no evidence of a large vessel occlusion  -May resume home blood pressure medications gradually  # Severe malnutrition - Will check thiamine level - empiric supplementation of thiamine in the interim (400 mg q8hr x 3 days, then 100 mg daily IV) - Borderline low B12 in the 200s, added on MMA - Empiric B12 supplementation pending MMA levels (1000 mcg daily IM x 7 days, then 1000 mcg PO daily) - Multivitamin  - Appreciate nutrition recommendations and primary team   # Pneumonia -Appreciate primary team's management  # Anemia with profound iron deficiency -Consider IV iron repletion after acute infection is passed  Lesleigh Noe MD-PhD [facility] [phone]  Addended for charge capture

## 2020-07-23 NOTE — ED Notes (Signed)
RN called MRI to notify that pt is ready for imaging.

## 2020-07-23 NOTE — ED Notes (Signed)
Off floor to ct

## 2020-07-23 NOTE — H&P (Addendum)
History and Physical  [name] MGQ:[id number] DOB: 27-Jun-1961 DOA: 07/23/2020  Referring physician: Dr. Lin Landsman PCP: London Pepper, MD  Outpatient Specialists: Gynecology Patient coming from: Home  Chief Complaint: Confusion and right-sided weakness.  HPI: [name] is a 59 y.o. female with medical history significant for prior CVA with mild right-sided deficit not on antiplatelets for the past year, essential hypertension, GERD, microcytic anemia who presented to [facility] ED from her [place] office due to confusion and worsened right-sided weakness.  Last known well last night prior to bed.  Her husband reports new cough since last night and significant weight loss for the last several months.  Poor appetite and poor oral intake.  Denies use of NSAIDs, abdominal pain, diarrhea or constipation.  She was sent to the ED for further evaluation and management of her right sided weaness.  ED Course: Febrile with T-max 102.9, heart rate 121, respiration rate of 29.  Chest x-ray showing left basilar infiltrates with suspicion for pneumonia and aspiration.  Lab studies remarkable for WBC 13.9K, hemoglobin 7.9K and MCV of 79.  Neutrophilia 12.1.  TRH was asked to admit.   Review of Systems: Review of systems as noted in the HPI. All other systems reviewed and are negative.    Past Medical History:  Diagnosis Date  . 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 Cerebrovascular accident (stroke) ([place]) 2005  . Dental caries 01/27/2008  . GERD (gastroesophageal reflux disease) 11/27/2006  . 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 Hypertension 11/23/2006   Past Surgical History:  Procedure Laterality Date  . CESAREAN SECTION    . cyst removed from right side  yrs ago  . EUS N/A 01/07/2014   Procedure: ESOPHAGEAL ENDOSCOPIC ULTRASOUND (EUS) RADIAL;  Surgeon: Arta Silence, MD;  Location: [facility] ENDOSCOPY;  Service: Endoscopy;  Laterality: N/A;    . FINE NEEDLE ASPIRATION N/A 01/07/2014   Procedure: FINE NEEDLE ASPIRATION (FNA) LINEAR;  Surgeon: Arta Silence, MD;  Location: [facility] ENDOSCOPY;  Service: Endoscopy;  Laterality: N/A;  . TUBAL LIGATION  yrs ago    Social History:  reports that she quit smoking about 6 years ago. Her smoking use included cigarettes. She has a 5.00 pack-year smoking history. She has never used smokeless tobacco. She reports current alcohol use. She reports that she does not use drugs.   No Known Allergies  Family History  Problem Relation Age of Onset  . Hypertension Mother   . Hypertension Daughter       Prior to Admission medications   Medication Sig Start Date End Date Taking? Authorizing Provider  acetaminophen (TYLENOL) 500 MG tablet Take 500 mg by mouth every 6 (six) hours as needed for moderate pain or headache.    [provider]  dipyridamole-aspirin (AGGRENOX) 200-25 MG per 12 hr capsule Take 1 capsule by mouth 2 (two) times daily. 01/10/14   Arta Silence, MD  ENSURE PLUS (ENSURE PLUS) LIQD Take 237 mLs by mouth daily.    [provider]  lisinopril (PRINIVIL,ZESTRIL) 20 MG tablet Take 20 mg by mouth every morning.    [provider]  pantoprazole (PROTONIX) 40 MG tablet Take 40 mg by mouth daily.    [provider]    Physical Exam: BP 124/80   Pulse (!) 121   Temp (!) 102.9 F (39.4 C) (Oral)   Resp (!) 27   SpO2 98%   . General: 59 y.o. year-old female well developed well  nourished in no acute distress.  Alert and oriented x2. . Cardiovascular: Regular rate and rhythm with no rubs or gallops.  No thyromegaly or JVD noted.  No lower extremity edema. 2/4 pulses in all 4 extremities. Marland Kitchen Respiratory: Mild rales at bases.  No wheezing noted.  Poor respiratory effort.   . Abdomen: Soft nontender nondistended with normal bowel sounds x4 quadrants. . Muskuloskeletal: No cyanosis, clubbing or edema noted bilaterally . Neuro: CN II-XII intact, sensation,  reflexes, 3/5 strength in right lower extremity. . Skin: No ulcerative lesions noted or rashes . Psychiatry: Judgement and insight appear normal. Mood is appropriate for condition and setting          Labs on Admission:  Basic Metabolic Panel: Recent Labs  Lab 07/23/20 1649  NA 132*  K 3.5  CL 99  CO2 22  GLUCOSE 154*  BUN 7*  CREATININE 0.76  CALCIUM 8.8*   Liver Function Tests: Recent Labs  Lab 07/23/20 1649  AST 9*  ALT 6  ALKPHOS 87  BILITOT 0.5  PROT 7.7  ALBUMIN 1.9*   No results for input(s): LIPASE, AMYLASE in the last 168 hours. No results for input(s): AMMONIA in the last 168 hours. CBC: Recent Labs  Lab 07/23/20 1649  WBC 13.9*  NEUTROABS 12.1*  HGB 7.9*  HCT 25.6*  MCV 79.3*  PLT 519*   Cardiac Enzymes: No results for input(s): CKTOTAL, CKMB, CKMBINDEX, TROPONINI in the last 168 hours.  BNP (last 3 results) No results for input(s): BNP in the last 8760 hours.  ProBNP (last 3 results) No results for input(s): PROBNP in the last 8760 hours.  CBG: No results for input(s): GLUCAP in the last 168 hours.  Radiological Exams on Admission: CT Head Wo Contrast  Result Date: 07/23/2020 CLINICAL DATA:  Neuro deficit, acute, stroke suspected. Additional history provided: Sudden onset unsteady gait, stroke 2 years ago affecting right side EXAM: CT HEAD WITHOUT CONTRAST TECHNIQUE: Contiguous axial images were obtained from the base of the skull through the vertex without intravenous contrast. COMPARISON:  Noncontrast head CT 01/21/2005 FINDINGS: Brain: There is a 3.5 cm focus of cortical/subcortical hypodensity within the anterolateral left frontal lobe consistent with acute/early subacute infarction (for instance as seen on series 3, image 15). There is no significant mass effect at this time. Redemonstrated chronic cortically based infarct predominantly affecting portions of the right temporal and parietal lobes as well as posterior right insula. Chronic  infarction changes also affect the posterior right thalamus. Background mild ill-defined hypoattenuation within the cerebral white matter is nonspecific, but consistent with chronic small vessel ischemic disease. There is no acute intracranial hemorrhage. No extra-axial fluid collection. No evidence of intracranial mass. Vascular:No hyperdense vessel.  Atherosclerotic calcifications. Skull: Normal. Negative for fracture or focal lesion. Sinuses/Orbits: Visualized orbits show no acute finding. Minimal scattered paranasal sinus mucosal thickening. No significant mastoid effusion. These results were called by telephone at the time of interpretation on 07/23/2020 at 6:48 pm to provider [facility] , who verbally acknowledged these results. IMPRESSION: 3.5 cm acute/early subacute cortical/subcortical infarct within the anterolateral left frontal lobe. Redemonstrated chronic right MCA vascular territory cortically based infarct. Chronic infarct within the posterior right thalamus. Background mild cerebral white matter chronic small vessel ischemic disease. Electronically Signed   By: Kellie Simmering DO   On: 07/23/2020 18:50   DG Chest Port 1 View  Result Date: 07/23/2020 CLINICAL DATA:  Sepsis EXAM: PORTABLE CHEST 1 VIEW COMPARISON:  01/19/2005 FINDINGS: Single frontal view of the  chest demonstrates dense consolidation at the left lung base compatible with pneumonia or aspiration. Diffuse interstitial prominence elsewhere within the lungs likely reflects scarring. No effusion or pneumothorax. There is elevation of the left hemidiaphragm. The cardiac silhouette is unremarkable. IMPRESSION: 1. Dense left basilar consolidation, compatible with pneumonia or aspiration. Followup PA and lateral chest X-ray is recommended in 3-4 weeks following trial of antibiotic therapy to ensure resolution and exclude underlying malignancy. Electronically Signed   By: Randa Ngo M.D.   On: 07/23/2020 16:55    EKG: I independently  viewed the EKG done and my findings are as followed: Sinus tachycardia 123.  No specific ST-T changes.  Assessment/Plan Present on Admission: . Acute CVA (cerebrovascular accident) [facility])  Active Problems:   Acute CVA (cerebrovascular accident) ([place])   Acute/early subacute cortical/subcortical infarct within the anterolateral left frontal lobe  Seen on CT head, also showing chronic infarct within the posterior right thalamus. Admitted for stroke work-up Obtain MRI brain, MRA head and neck, 2D echo Obtain lipid panel, A1c PT/OT/speech therapy evaluation N.p.o. until passes swallow evaluation, per bedside RN Darylene Price, passed bedside screening. Start ASA 325 mg daily and Lipitor 80 mg daily Permissive hypertension Neurochecks every 4 hours Fall and aspiration precautions  Sepsis secondary to left lower lobe community-acquired pneumonia with possible aspiration, POA Presented with fever T-max of 102.9, tachycardia 121, tachypnea 29 and left lower lobe infiltrates on chest x-ray suggestive of pneumonia UA pending Obtain sputum culture, blood cultures x2 peripherally, follow cultures. Treat empirically with Rocephin and azithromycin, started in ED, continue. Antitussive, pulmonary toilet Maintain O2 saturation greater than 92% Aspiration precautions  Acute metabolic encephalopathy in the setting of acute CVA and sepsis secondary to pneumonia UA is pending at the time of this dictation Treat underlying causes Reorient as needed  Chronic microcytic anemia Presented with hemoglobin of 7.9 and an MCV of 79 No overt bleeding Obtain FOBT, iron studies Consult GI in the morning to rule out GI causes, to evaluate for possible endoscopy  Weight loss, unclear etiology Obtain TSH If GI causes are ruled out may consider chest and abdominal CT scan to exclude malignancy  Hyperglycemia Not on oral hypoglycemics at home Obtain hemoglobin A1c Start insulin sliding scale  History of  CVA Per the patient and her husband at bedside, she stopped taking home Aggrenox for 1 year. Not on statin at home, no neurology outpatient follow up  Essential hypertension Permissive hypertension in the setting of acute CVA Hold off antihypertensives  Hyperlipidemia Obtain lipid panel Lipitor 80 mg daily  Ambulatory dysfunction PT OT to assess Fall precautions   DVT prophylaxis: Subcutaneous Lovenox daily  Code Status: Full code per the patient and husband at bedside.  Family Communication: Updated her husband at bedside.  Disposition Plan: Admit to telemetry medical  Consults called: Neurology consulted by EDP  Admission status: Inpatient status   Status is: Inpatient    Dispo:  Patient From: Home  Planned Disposition: Home with Health Care Svc  Expected discharge date: 07/26/20  Medically stable for discharge: No, ongoing work-up for acute CVA.        Kayleen Memos MD [facility] Pager [phone]  If 7PM-7AM, please contact night-coverage [email or web address] Password [facility]  07/23/2020, 8:07 PM

## 2020-07-23 NOTE — ED Notes (Signed)
RN notified Dr. Nevada Crane that patient passed swallow screen.

## 2020-07-23 NOTE — Care Management (Signed)
Patient's husband would like to be notified once patient is transferred to a room.

## 2020-07-23 NOTE — ED Provider Notes (Signed)
[facility] EMERGENCY DEPARTMENT Provider Note   CSN: [id number] Arrival date & time: 07/23/20  1556     History Chief Complaint  Patient presents with  . poss stroke    [name] is a 59 y.o. female past medical history of hypertension, mild homocystinemia, CVA with right-sided deficits presents to the ED from PCP given concerns for strokelike symptoms. Per the patient's relative bedside, she has been coughing a lot over the last couple days, noted this morning to be more confused than usual and may be more weak than usual on the right side, presented to PCP visit today for a Pap smear where she was noted to be "off balance" concerning for stroke so she was sent here for further evaluation.  Notably on arrival the patient is febrile to 102 degrees, tachycardic to 140. Significant right-sided deficits appreciated, oriented x3 but with some increasing confusion subjectively. Last known normal last night, code stroke not initiated.  The history is provided by the patient and the spouse.  Illness Quality:  Confusion, right sided weakness Severity:  Moderate Onset quality:  Gradual Duration:  1 day Timing:  Constant Progression:  Worsening Chronicity:  New Context:  Fever, cough Associated symptoms: cough, fatigue, fever and shortness of breath   Associated symptoms: no abdominal pain, no chest pain, no headaches, no rash and no vomiting        Past Medical History:  Diagnosis Date  . 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 Cerebrovascular accident (stroke) ([place]) 2005  . Dental caries 01/27/2008  . GERD (gastroesophageal reflux disease) 11/27/2006  . 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 Hypertension 11/23/2006    Patient Active Problem List   Diagnosis Date Noted  . Acute CVA (cerebrovascular accident) ([place]) 07/23/2020  . DENTAL CARIES 01/27/2008  . GERD 11/27/2006  . ANOREXIA  11/27/2006  . ABDOMINAL MASS 11/27/2006  . HYPERHOMOCYSTEINEMIA 11/23/2006  . HYPERTENSION 11/23/2006  . LOW BACK PAIN 11/23/2006  . CEREBROVASCULAR ACCIDENT, HX OF 11/23/2006    Past Surgical History:  Procedure Laterality Date  . CESAREAN SECTION    . cyst removed from right side  yrs ago  . EUS N/A 01/07/2014   Procedure: ESOPHAGEAL ENDOSCOPIC ULTRASOUND (EUS) RADIAL;  Surgeon: Arta Silence, MD;  Location: [facility];  Service: Endoscopy;  Laterality: N/A;  . FINE NEEDLE ASPIRATION N/A 01/07/2014   Procedure: FINE NEEDLE ASPIRATION (FNA) LINEAR;  Surgeon: Arta Silence, MD;  Location: [facility];  Service: Endoscopy;  Laterality: N/A;  . TUBAL LIGATION  yrs ago     OB History   No obstetric history on file.     Family History  Problem Relation Age of Onset  . Hypertension Mother   . Hypertension Daughter     Social History   Tobacco Use  . Smoking status: Former Smoker    Packs/day: 0.25    Years: 20.00    Pack years: 5.00    Types: Cigarettes    Quit date: 11/13/2013    Years since quitting: 6.6  . Smokeless tobacco: Never Used  Substance Use Topics  . Alcohol use: Yes    Comment: occasional beer  . Drug use: No    Home Medications Prior to Admission medications   Medication Sig Start Date End Date Taking? Authorizing Provider  acetaminophen (TYLENOL) 500 MG tablet Take 500 mg by mouth every 6 (six) hours as needed for moderate pain or headache.  [provider]  dipyridamole-aspirin (AGGRENOX) 200-25 MG per 12 hr capsule Take 1 capsule by mouth 2 (two) times daily. 01/10/14   Arta Silence, MD  ENSURE PLUS (ENSURE PLUS) LIQD Take 237 mLs by mouth daily.    [provider]  lisinopril (PRINIVIL,ZESTRIL) 20 MG tablet Take 20 mg by mouth every morning.    [provider]  pantoprazole (PROTONIX) 40 MG tablet Take 40 mg by mouth daily.    [provider]    Allergies    Patient has no known allergies.  Review of  Systems   Review of Systems  Constitutional: Positive for chills, fatigue and fever.  HENT: Negative for facial swelling and voice change.   Eyes: Negative for redness and visual disturbance.  Respiratory: Positive for cough and shortness of breath.   Cardiovascular: Negative for chest pain and palpitations.  Gastrointestinal: Negative for abdominal pain and vomiting.  Genitourinary: Negative for difficulty urinating and dysuria.  Musculoskeletal: Negative for gait problem and joint swelling.  Skin: Negative for rash and wound.  Neurological: Positive for weakness. Negative for dizziness and headaches.  Psychiatric/Behavioral: Negative for confusion and suicidal ideas.    Physical Exam Updated Vital Signs BP 124/80   Pulse (!) 121   Temp (!) 102.9 F (39.4 C) (Oral)   Resp (!) 27   SpO2 98%   Physical Exam Constitutional:      General: She is not in acute distress.    Appearance: She is ill-appearing.  HENT:     Head: Normocephalic and atraumatic.     Mouth/Throat:     Mouth: Mucous membranes are moist.     Pharynx: Oropharynx is clear.  Eyes:     General: No scleral icterus.    Extraocular Movements: Extraocular movements intact.     Pupils: Pupils are equal, round, and reactive to light.  Cardiovascular:     Rate and Rhythm: Normal rate and regular rhythm.     Pulses: Normal pulses.  Pulmonary:     Effort: Pulmonary effort is normal. No respiratory distress.  Abdominal:     General: There is no distension.     Tenderness: There is no abdominal tenderness.  Musculoskeletal:        General: No tenderness or deformity.     Cervical back: Normal range of motion and neck supple.  Neurological:     Mental Status: She is alert. She is confused.     Cranial Nerves: Cranial nerve deficit and facial asymmetry present.     Sensory: No sensory deficit.     Motor: Weakness and pronator drift present.     Gait: Gait abnormal.  Psychiatric:        Mood and Affect: Mood  normal.        Behavior: Behavior normal.     ED Results / Procedures / Treatments   Labs (all labs ordered are listed, but only abnormal results are displayed) Labs Reviewed  APTT - Abnormal; Notable for the following components:      Result Value   aPTT 40 (*)    All other components within normal limits  CBC - Abnormal; Notable for the following components:   WBC 13.9 (*)    RBC 3.23 (*)    Hemoglobin 7.9 (*)    HCT 25.6 (*)    MCV 79.3 (*)    MCH 24.5 (*)    RDW 17.2 (*)    Platelets 519 (*)    All other components within normal limits  DIFFERENTIAL - Abnormal; Notable for the following components:   Neutro Abs 12.1 (*)    All other components within normal limits  COMPREHENSIVE METABOLIC PANEL - Abnormal; Notable for the following components:   Sodium 132 (*)    Glucose, Bld 154 (*)    BUN 7 (*)    Calcium 8.8 (*)    Albumin 1.9 (*)    AST 9 (*)    All other components within normal limits  SARS CORONAVIRUS 2 BY RT PCR (HOSPITAL ORDER, [facility])  CULTURE, BLOOD (SINGLE)  URINE CULTURE  CULTURE, BLOOD (SINGLE)  ETHANOL  PROTIME-INR  LACTIC ACID, PLASMA  RAPID URINE DRUG SCREEN, HOSP PERFORMED  URINALYSIS, ROUTINE W REFLEX MICROSCOPIC  LACTIC ACID, PLASMA  I-STAT CHEM 8, ED  POC OCCULT BLOOD, ED    EKG EKG Interpretation  Date/Time:  Friday July 23 2020 16:02:31 EDT Ventricular Rate:  143 PR Interval:    QRS Duration: 80 QT Interval:  374 QTC Calculation: 577 R Axis:   60 Text Interpretation: Sinus tachycardia with short PR Nonspecific ST and T wave abnormality Abnormal ECG Since last tracing rate faster Confirmed by Isla Pence [phone]) on 07/23/2020 4:26:35 PM   Radiology CT Head Wo Contrast  Result Date: 07/23/2020 CLINICAL DATA:  Neuro deficit, acute, stroke suspected. Additional history provided: Sudden onset unsteady gait, stroke 2 years ago affecting right side EXAM: CT HEAD WITHOUT CONTRAST TECHNIQUE:  Contiguous axial images were obtained from the base of the skull through the vertex without intravenous contrast. COMPARISON:  Noncontrast head CT 01/21/2005 FINDINGS: Brain: There is a 3.5 cm focus of cortical/subcortical hypodensity within the anterolateral left frontal lobe consistent with acute/early subacute infarction (for instance as seen on series 3, image 15). There is no significant mass effect at this time. Redemonstrated chronic cortically based infarct predominantly affecting portions of the right temporal and parietal lobes as well as posterior right insula. Chronic infarction changes also affect the posterior right thalamus. Background mild ill-defined hypoattenuation within the cerebral white matter is nonspecific, but consistent with chronic small vessel ischemic disease. There is no acute intracranial hemorrhage. No extra-axial fluid collection. No evidence of intracranial mass. Vascular:No hyperdense vessel.  Atherosclerotic calcifications. Skull: Normal. Negative for fracture or focal lesion. Sinuses/Orbits: Visualized orbits show no acute finding. Minimal scattered paranasal sinus mucosal thickening. No significant mastoid effusion. These results were called by telephone at the time of interpretation on 07/23/2020 at 6:48 pm to provider [facility] , who verbally acknowledged these results. IMPRESSION: 3.5 cm acute/early subacute cortical/subcortical infarct within the anterolateral left frontal lobe. Redemonstrated chronic right MCA vascular territory cortically based infarct. Chronic infarct within the posterior right thalamus. Background mild cerebral white matter chronic small vessel ischemic disease. Electronically Signed   By: Kellie Simmering DO   On: 07/23/2020 18:50   DG Chest Port 1 View  Result Date: 07/23/2020 CLINICAL DATA:  Sepsis EXAM: PORTABLE CHEST 1 VIEW COMPARISON:  01/19/2005 FINDINGS: Single frontal view of the chest demonstrates dense consolidation at the left lung base  compatible with pneumonia or aspiration. Diffuse interstitial prominence elsewhere within the lungs likely reflects scarring. No effusion or pneumothorax. There is elevation of the left hemidiaphragm. The cardiac silhouette is unremarkable. IMPRESSION: 1. Dense left basilar consolidation, compatible with pneumonia or aspiration. Followup PA and lateral chest X-ray is recommended in 3-4 weeks following trial of antibiotic therapy to ensure resolution and exclude underlying malignancy. Electronically Signed   By: Diana Eves.D.  On: 07/23/2020 16:55    Procedures Procedures (including critical care time)  Medications Ordered in ED Medications  cefTRIAXone (ROCEPHIN) 2 g in sodium chloride 0.9 % 100 mL IVPB (0 g Intravenous Stopped 07/23/20 2001)  azithromycin (ZITHROMAX) 500 mg in sodium chloride 0.9 % 250 mL IVPB (500 mg Intravenous New Bag/Given 07/23/20 2001)  sodium chloride 0.9 % bolus 2,000 mL (2,000 mLs Intravenous New Bag/Given 07/23/20 1851)    ED Course  I have reviewed the triage vital signs and the nursing notes.  Pertinent labs & imaging results that were available during my care of the patient were reviewed by me and considered in my medical decision making (see chart for details).    MDM Rules/Calculators/A&P                          EKG findings by my read: Compared to prior: None.  Rate: 123 rhythm: sinus Axis: appropriate  PR: Appropriate QRS: 75 QTc: 407. Somewhat biphasic T waves in lateral leads without V6 lead picking up which is nonspecific and may be rate driven, otherwise no evidence of ischemia or arrhythmia, nor any other pathologic findings concerning considering patient presentation. Findings discussed with attending who agrees.  Differential diagnosis considered: CVA, recrudescence of CVA symptoms, sepsis, pneumonia, viral illness, urinary tract infection, intra-abdominal infection, toxic/metabolic encephalopathy, delirium  Patient presents for worsening  right-sided weakness, confusion with last known normal last night. This is in the setting of cough and fevers and on presentation patient noted to be tachycardic and febrile, no obvious source of infection initially with viral illness being highly likely, infectious work-up initiated without declaration of sepsis  Patient well outside of window for TPA, not called out as a code stroke and have a high suspicion for recrudescence of strokelike symptoms.  Chest x-ray showing left basilar pneumonia which represents a potential source for sepsis, sepsis declared and patient ordered fluids, Rocephin, azithromycin with no concerns for hospital-acquired infection  We will administer 2 L normal saline instead of LR given concerns for compatibility with her access.  Hemoglobin low, no reported melena or hematochezia, will perform rectal exam.  Rectal exam without melena or blood, FOBT negative.  Case discussed with Dr. Malen Gauze with neurology, formal consult to be performed by night team, suggesting MRI but no need for intervention at this time.  CT head showed concern for acute versus subacute right frontal stroke which does correlate with symptoms.  Hospital medicine consulted for admission for stroke work-up not requiring TPA or specific intervention along with sepsis and new anemia  Patient accepted by Dr. Wyatt Haste reviewed and interpreted by myself with significant findings above. Imaging reviewed by myself and interpreted by radiologist.  Case and plan above discussed with my attending Dr. Gilford Raid  Final Clinical Impression(s) / ED Diagnoses Final diagnoses:  Sepsis without acute organ dysfunction, due to unspecified organism [facility])  Community acquired pneumonia of left lower lobe of lung    Rx / DC Orders ED Discharge Orders    None     Labs, studies and imaging reviewed by myself and considered in medical decision making if ordered. Imaging interpreted by radiology. Pt was discussed  with my attending, Dr. Gilford Raid  Electronically signed by:  Roderic Palau [place]9/10/20218:09 PM       Renold Genta, MD 07/23/20 2009    Isla Pence, MD 07/23/20 2032

## 2020-07-23 NOTE — ED Triage Notes (Signed)
Emergency Medicine Provider Triage Evaluation Note  [name] , a 59 y.o. female  was evaluated in triage.  Pt complains of altered mental status, weakness.  Of 5 caveat due to altered mental status.  Has been reports that the patient has been losing weight steadily over the last several months.  Last night he noticed she had developed a cough.  Today at around 11 AM he noticed that she was confused and had worsening gait.  He notes that she has chronic right-sided deficits from prior stroke but that they appeared more pronounced today.  In the ED she is febrile, tachycardic.  She is confused.  Earlier stated that [name] was the president but is able to tell me that [name] is the president on my assessment.  She cannot tell me that she is in the hospital or in the emergency department.  She denies any complaints at this time.  He states she is fully vaccinated against Covid.  Review of Systems  Positive: Confusion, fever, weakness   Physical Exam  BP 128/86   Pulse (!) 142   Temp (!) 102.9 F (39.4 C) (Oral)   Resp (!) 24   SpO2 96%  Gen:   Awake, no distress   HEENT:  Atraumatic  Resp:  Normal effort  Cardiac:  Normal rate, occasional cough noted Abd:   Nondistended, nontender  MSK:   Moves extremities without difficulty  Neuro:  Confused, follows some commands but not others.  Slow to answer some questions.  Right upper and lower extremity weakness noted as compared to the left.  Sensation intact to light touch of face and extremities, right pronator drift noted.  Medical Decision Making  Medically screening exam initiated at 4:19 PM.  Appropriate orders placed.  [name] was informed that the remainder of the evaluation will be completed by another provider, this initial triage assessment does not replace that evaluation, and the importance of remaining in the ED until their evaluation is complete.  Clinical Impression  Fever, confusion, weakness.  Concern for  infectious etiology versus CVA though this would be atypical in the setting of fever.  Patient will be moved back into her room as soon as possible for further evaluation.   Tanya Papa, PA-C 07/23/20 1621

## 2020-07-23 NOTE — ED Notes (Signed)
MRI to get patient after antibiotic infusion complete.

## 2020-07-23 NOTE — ED Triage Notes (Signed)
The pt was at doctors office this am at 1000  When she walked out the  Husband reports that the pt had an unsteady gait  He reports that  The pt had a stroke  2 years ago affecting her  Rt side.  Now she is alert answers some questions  Accurately she has weaknress in her rt arm and rt leg  More than usual  Drift to the rt arm

## 2020-07-23 NOTE — ED Notes (Signed)
This RN messaged MD about pt's HR increasing to 120s. MD made aware. No new orders at this time.

## 2020-07-24 ENCOUNTER — Inpatient Hospital Stay (HOSPITAL_COMMUNITY): Payer: BC Managed Care – PPO

## 2020-07-24 DIAGNOSIS — E43 Unspecified severe protein-calorie malnutrition: Secondary | ICD-10-CM

## 2020-07-24 DIAGNOSIS — G9341 Metabolic encephalopathy: Secondary | ICD-10-CM

## 2020-07-24 DIAGNOSIS — D500 Iron deficiency anemia secondary to blood loss (chronic): Secondary | ICD-10-CM

## 2020-07-24 DIAGNOSIS — I6389 Other cerebral infarction: Secondary | ICD-10-CM

## 2020-07-24 LAB — CBC WITH DIFFERENTIAL/PLATELET
Abs Immature Granulocytes: 0.1 10*3/uL — ABNORMAL HIGH (ref 0.00–0.07)
Basophils Absolute: 0 10*3/uL (ref 0.0–0.1)
Basophils Relative: 0 %
Eosinophils Absolute: 0 10*3/uL (ref 0.0–0.5)
Eosinophils Relative: 0 %
HCT: 23.7 % — ABNORMAL LOW (ref 36.0–46.0)
Hemoglobin: 7.2 g/dL — ABNORMAL LOW (ref 12.0–15.0)
Immature Granulocytes: 1 %
Lymphocytes Relative: 13 %
Lymphs Abs: 1.6 10*3/uL (ref 0.7–4.0)
MCH: 24.2 pg — ABNORMAL LOW (ref 26.0–34.0)
MCHC: 30.4 g/dL (ref 30.0–36.0)
MCV: 79.8 fL — ABNORMAL LOW (ref 80.0–100.0)
Monocytes Absolute: 1.6 10*3/uL — ABNORMAL HIGH (ref 0.1–1.0)
Monocytes Relative: 13 %
Neutro Abs: 9 10*3/uL — ABNORMAL HIGH (ref 1.7–7.7)
Neutrophils Relative %: 73 %
Platelets: 406 10*3/uL — ABNORMAL HIGH (ref 150–400)
RBC: 2.97 MIL/uL — ABNORMAL LOW (ref 3.87–5.11)
RDW: 17.2 % — ABNORMAL HIGH (ref 11.5–15.5)
WBC: 12.4 10*3/uL — ABNORMAL HIGH (ref 4.0–10.5)
nRBC: 0 % (ref 0.0–0.2)

## 2020-07-24 LAB — LIPID PANEL
Cholesterol: 119 mg/dL (ref 0–200)
HDL: 59 mg/dL (ref >40)
LDL Cholesterol: 41 mg/dL (ref 0–99)
Total CHOL/HDL Ratio: 2 RATIO
Triglycerides: 95 mg/dL (ref <150)
VLDL: 19 mg/dL (ref 0–40)

## 2020-07-24 LAB — ECHOCARDIOGRAM COMPLETE BUBBLE STUDY
Area-P 1/2: 3.53 cm2
S' Lateral: 2.4 cm

## 2020-07-24 LAB — GLUCOSE, CAPILLARY
Glucose-Capillary: 106 mg/dL — ABNORMAL HIGH (ref 70–99)
Glucose-Capillary: 104 mg/dL — ABNORMAL HIGH (ref 70–99)
Glucose-Capillary: 99 mg/dL (ref 70–99)
Glucose-Capillary: 120 mg/dL — ABNORMAL HIGH (ref 70–99)
Glucose-Capillary: 113 mg/dL — ABNORMAL HIGH (ref 70–99)
Glucose-Capillary: 107 mg/dL — ABNORMAL HIGH (ref 70–99)

## 2020-07-24 LAB — MAGNESIUM: Magnesium: 2.3 mg/dL (ref 1.7–2.4)

## 2020-07-24 LAB — COMPREHENSIVE METABOLIC PANEL
ALT: 7 U/L (ref 0–44)
AST: 12 U/L — ABNORMAL LOW (ref 15–41)
Albumin: 1.5 g/dL — ABNORMAL LOW (ref 3.5–5.0)
Alkaline Phosphatase: 70 U/L (ref 38–126)
Anion gap: 11 (ref 5–15)
BUN: 7 mg/dL — ABNORMAL LOW (ref 8–23)
CO2: 18 mmol/L — ABNORMAL LOW (ref 22–32)
Calcium: 8 mg/dL — ABNORMAL LOW (ref 8.9–10.3)
Chloride: 104 mmol/L (ref 98–111)
Creatinine, Ser: 0.57 mg/dL (ref 0.44–1.00)
GFR calc Af Amer: >60 mL/min (ref >60)
GFR calc non Af Amer: >60 mL/min (ref >60)
Glucose, Bld: 106 mg/dL — ABNORMAL HIGH (ref 70–99)
Potassium: 3.2 mmol/L — ABNORMAL LOW (ref 3.5–5.1)
Sodium: 133 mmol/L — ABNORMAL LOW (ref 135–145)
Total Bilirubin: 0.4 mg/dL (ref 0.3–1.2)
Total Protein: 6.3 g/dL — ABNORMAL LOW (ref 6.5–8.1)

## 2020-07-24 LAB — VITAMIN B12: Vitamin B-12: 275 pg/mL (ref 180–914)

## 2020-07-24 LAB — FOLATE: Folate: 9.6 ng/mL (ref >5.9)

## 2020-07-24 LAB — IRON AND TIBC
Iron: <5 ug/dL — ABNORMAL LOW (ref 28–170)
TIBC: 150 ug/dL — ABNORMAL LOW (ref 250–450)

## 2020-07-24 LAB — HEMOGLOBIN A1C
Hgb A1c MFr Bld: 6.1 % — ABNORMAL HIGH (ref 4.8–5.6)
Mean Plasma Glucose: 128.37 mg/dL

## 2020-07-24 LAB — TSH: TSH: 0.613 u[IU]/mL (ref 0.350–4.500)

## 2020-07-24 LAB — FERRITIN: Ferritin: 117 ng/mL (ref 11–307)

## 2020-07-24 MED ORDER — INSULIN ASPART 100 UNIT/ML ~~LOC~~ SOLN: 0.0000 [IU] | Freq: Three times a day (TID) | SUBCUTANEOUS | Status: DC

## 2020-07-24 MED ORDER — SODIUM CHLORIDE 0.9 % IV SOLN
INTRAVENOUS | Status: DC | PRN
  Administered 2020-07-24 (×2): 250 mL via INTRAVENOUS

## 2020-07-24 MED ORDER — MAGNESIUM SULFATE 2 GM/50ML IV SOLN
2.0000 g | Freq: Once | INTRAVENOUS | Status: AC
  Administered 2020-07-24: 2 g via INTRAVENOUS
  Filled 2020-07-24: qty 50

## 2020-07-24 MED ORDER — POTASSIUM CHLORIDE 10 MEQ/100ML IV SOLN
10.0000 meq | INTRAVENOUS | Status: AC
  Administered 2020-07-24 (×4): 10 meq via INTRAVENOUS
  Filled 2020-07-24 (×3): qty 100

## 2020-07-24 MED ORDER — THIAMINE HCL 100 MG/ML IJ SOLN: 100.0000 mg | Freq: Every day | INTRAMUSCULAR | Status: DC

## 2020-07-24 MED ORDER — GADOBUTROL 1 MMOL/ML IV SOLN
6.0000 mL | Freq: Once | INTRAVENOUS | Status: AC | PRN
  Administered 2020-07-24: 6 mL via INTRAVENOUS

## 2020-07-24 MED ORDER — POTASSIUM CHLORIDE CRYS ER 20 MEQ PO TBCR: 40.0000 meq | EXTENDED_RELEASE_TABLET | Freq: Two times a day (BID) | ORAL | Status: DC

## 2020-07-24 MED ORDER — POTASSIUM CHLORIDE CRYS ER 20 MEQ PO TBCR
40.0000 meq | EXTENDED_RELEASE_TABLET | Freq: Once | ORAL | Status: AC
  Administered 2020-07-24: 40 meq via ORAL
  Filled 2020-07-24: qty 2

## 2020-07-24 MED ORDER — CYANOCOBALAMIN 1000 MCG/ML IJ SOLN
1000.0000 ug | Freq: Every day | INTRAMUSCULAR | Status: DC
  Administered 2020-07-24 – 2020-07-26 (×3): 1000 ug via SUBCUTANEOUS
  Filled 2020-07-24 (×3): qty 1

## 2020-07-24 MED ORDER — THIAMINE HCL 100 MG/ML IJ SOLN
400.0000 mg | Freq: Three times a day (TID) | INTRAVENOUS | Status: DC
  Administered 2020-07-24 – 2020-07-26 (×7): 400 mg via INTRAVENOUS
  Filled 2020-07-24 (×10): qty 4

## 2020-07-24 MED ORDER — VITAMIN B-12 1000 MCG PO TABS: 1000.0000 ug | ORAL_TABLET | Freq: Every day | ORAL | Status: DC

## 2020-07-24 MED ORDER — ADULT MULTIVITAMIN W/MINERALS CH
1.0000 | ORAL_TABLET | Freq: Every day | ORAL | Status: DC
  Administered 2020-07-24 – 2020-07-26 (×3): 1 via ORAL
  Filled 2020-07-24 (×3): qty 1

## 2020-07-24 NOTE — Evaluation (Signed)
Occupational Therapy Evaluation Patient Details Name: [name] MRN: [id number] DOB: 01-11-1961 Today's Date: 07/24/2020    History of Present Illness Treasa Bradshaw is a 59 y.o. female with a past medical history significant for right MCA stroke in 2006, hypertension, smoking, complicated by the development of anorexia, presenting with worsening weakness, fever felt to be secondary to pneumonia, and anemia. MRI scan of the brain shows by cerebral embolic-looking infarcts with the largest one being in the left anterior frontal cortex with gyriform appearance.    Clinical Impression   Pt admitted with the above diagnoses and presents with below problem list. Pt will benefit from continued acute OT to address the below listed deficits and maximize independence with basic ADLs prior to d/c home. PTA pt reports she was independent with basic ADLs, lives with spouse. Pt presents with impaired cognition and decreased activity tolerance. Currently supervision to min guard with OOB ADLs.  Pt noted to hold R eye closed at times. Denies diplopia. Attempted visual field assessment but limited by decreased multiple step command following (vs attention deficits). Able to track in all four Quadrants smoothly.  Pt with flat affect and appears to have decreased insight into her deficits/acute medical diagnosis. Plan to focus further OT sessions on cognition during ADLs and safety at home. Plan to further assess vision in a functional context as well.         Follow Up Recommendations  Outpatient OT;Supervision - Intermittent    Equipment Recommendations  None recommended by OT    Recommendations for Other Services       Precautions / Restrictions Precautions Precautions: Fall Restrictions Weight Bearing Restrictions: No      Mobility Bed Mobility Overal bed mobility: Modified Independent             General bed mobility comments: extra time  Transfers Overall transfer level: Needs  assistance Equipment used: None Transfers: Sit to/from Stand Sit to Stand: Modified independent (Device/Increase time)         General transfer comment: No assist required, steady upon standing.    Balance Overall balance assessment: Mild deficits observed, not formally tested                                         ADL either performed or assessed with clinical judgement   ADL Overall ADL's : Needs assistance/impaired Eating/Feeding: Set up;Sitting   Grooming: Set up;Sitting   Upper Body Bathing: Set up;Sitting   Lower Body Bathing: Min guard;Sit to/from stand   Upper Body Dressing : Set up;Sitting   Lower Body Dressing: Min guard;Sit to/from stand   Toilet Transfer: Min guard;Ambulation;RW   Toileting- Water quality scientist and Hygiene: Min guard;Sit to/from stand   Tub/ Shower Transfer: Min guard;Ambulation;Shower seat   Functional mobility during ADLs: Min guard General ADL Comments: Pt completed bed mobility, sat EOB a few minutes then sidestepped along EOB. Declined further OOB activity citing fatigue.      Vision   Additional Comments: Pt noted to hold R eye closed at time. Denies diplopia. Attempted visual field assessment but limited by decreased multiple step command following (vs attention deficits). Able to track in all four Quadrants smootlhly.      Perception     Praxis      Pertinent Vitals/Pain Pain Assessment: No/denies pain Faces Pain Scale: No hurt     Hand Dominance Right   Extremity/Trunk Assessment  Upper Extremity Assessment Upper Extremity Assessment: Overall WFL for tasks assessed;Generalized weakness   Lower Extremity Assessment Lower Extremity Assessment: Defer to PT evaluation RLE Deficits / Details: 4-/5 ankle dorsiflexion LLE Deficits / Details: Gross weakness compared to RLE hx of prior CVA       Communication Communication Communication: No difficulties   Cognition Arousal/Alertness:  Awake/alert Behavior During Therapy: Flat affect;WFL for tasks assessed/performed Overall Cognitive Status: No family/caregiver present to determine baseline cognitive functioning                                 General Comments: some difficulty following multiple step commands, selective attention level. "November 11" for date. possibly decreased initiation (vs fatigue?). short answers to questions, cueing for elaboration.    General Comments       Exercises     Shoulder Instructions      Home Living Family/patient expects to be discharged to:: Private residence Living Arrangements: Spouse/significant other Available Help at Discharge: Family;Available 24 hours/day Type of Home: House Home Access: Stairs to enter [employer] of Steps: 2 Entrance Stairs-Rails: None Home Layout: One level     Bathroom Shower/Tub: Tub/shower unit         Home Equipment: Walker - 2 wheels          Prior Functioning/Environment Level of Independence: Independent                 OT Problem List: Impaired balance (sitting and/or standing);Decreased activity tolerance;Decreased cognition;Decreased knowledge of use of DME or AE;Decreased knowledge of precautions      OT Treatment/Interventions: Self-care/ADL training;Therapeutic exercise;Energy conservation;DME and/or AE instruction;Therapeutic activities;Cognitive remediation/compensation;Patient/family education;Balance training    OT Goals(Current goals can be found in the care plan section) Acute Rehab OT Goals Patient Stated Goal: Go home OT Goal Formulation: With patient Time For Goal Achievement: 08/07/20 Potential to Achieve Goals: Good ADL Goals Pt Will Perform Tub/Shower Transfer: with supervision;ambulating Additional ADL Goal #1: Pt will independently answer correctly 3 basic questions related to safety with ADLs at home. Additional ADL Goal #2: Pt will independently navigate turns and obstacles  while ambulating household distances.  OT Frequency: Min 2X/week   Barriers to D/C:            Co-evaluation              AM-PAC OT "6 Clicks" Daily Activity     Outcome Measure Help from another person eating meals?: None Help from another person taking care of personal grooming?: None Help from another person toileting, which includes using toliet, bedpan, or urinal?: None Help from another person bathing (including washing, rinsing, drying)?: A Little Help from another person to put on and taking off regular upper body clothing?: None Help from another person to put on and taking off regular lower body clothing?: None 6 Click Score: 23   End of Session    Activity Tolerance: Patient limited by fatigue Patient left: in bed;with call bell/phone within reach;with bed alarm set  OT Visit Diagnosis: Unsteadiness on feet (R26.81);Muscle weakness (generalized) (M62.81);Other symptoms and signs involving cognitive function                Time: 1451-1502 OT Time Calculation (min): 11 min Charges:  OT General Charges $OT Visit: 1 Visit OT Evaluation $OT Eval Low Complexity: [place], OT Acute Rehabilitation Services Pager: [phone] Office: [phone]   Hortencia Pilar 07/24/2020, 3:53  PM

## 2020-07-24 NOTE — Evaluation (Signed)
Physical Therapy Evaluation Patient Details Name: [name] MRN: [id number] DOB: Apr 06, 1961 Today's Date: 07/24/2020   History of Present Illness  [name] is a 59 y.o. female with a past medical history significant for right MCA stroke in 2006, hypertension, smoking, complicated by the development of anorexia, presenting with worsening weakness, fever felt to be secondary to pneumonia, and anemia   Clinical Impression  Pt admitted with above diagnosis. Demonstrates mild gait abnormalities however ambulating without physical assistance. Easily distracted, following single step commands consistently. Some difficulty with multi-step commands. Pt would benefit from outpatient neuro rehabilitation. Pt currently with functional limitations due to the deficits listed below (see PT Problem List). Pt will benefit from skilled PT to increase their independence and safety with mobility to allow discharge to the venue listed below.       Follow Up Recommendations Outpatient PT (Neuro Rehab)    Equipment Recommendations  None recommended by PT    Recommendations for Other Services       Precautions / Restrictions Precautions Precautions: Fall Restrictions Weight Bearing Restrictions: No      Mobility  Bed Mobility Overal bed mobility: Modified Independent             General bed mobility comments: extra time  Transfers Overall transfer level: Needs assistance Equipment used: None Transfers: Sit to/from Stand Sit to Stand: Modified independent (Device/Increase time)         General transfer comment: No assist required, steady upon standing.  Ambulation/Gait Ambulation/Gait assistance: Supervision Gait Distance (Feet): 150 Feet Assistive device: None Gait Pattern/deviations: Step-through pattern;Decreased stride length;Drifts right/left Gait velocity: decreased Gait velocity interpretation: 1.31 - 2.62 ft/sec, indicative of limited community ambulator General Gait  Details: Gait with mild deviations from straight path. Easily distracted with noises and people walking by, stopped to look frequently. Some difficulty following multistep commands. Able to safely perform dynamic challenges including high marching head-turns and quick turn around.  Stairs            Wheelchair Mobility    Modified Rankin (Stroke Patients Only) Modified Rankin (Stroke Patients Only) Pre-Morbid Rankin Score: No significant disability Modified Rankin: Slight disability     Balance Overall balance assessment: Mild deficits observed, not formally tested                                           Pertinent Vitals/Pain Pain Assessment: No/denies pain    Home Living Family/patient expects to be discharged to:: Private residence Living Arrangements: Spouse/significant other Available Help at Discharge: Family;Available 24 hours/day Type of Home: House Home Access: Stairs to enter Entrance Stairs-Rails: None Entrance Stairs-Number of Steps: 2 Home Layout: One level Home Equipment: Environmental consultant - 2 wheels      Prior Function Level of Independence: Independent               Hand Dominance   Dominant Hand: Right    Extremity/Trunk Assessment   Upper Extremity Assessment Upper Extremity Assessment: Defer to OT evaluation    Lower Extremity Assessment Lower Extremity Assessment: RLE deficits/detail;LLE deficits/detail RLE Deficits / Details: 4-/5 ankle dorsiflexion LLE Deficits / Details: Gross weakness compared to RLE hx of prior CVA       Communication   Communication: No difficulties  Cognition Arousal/Alertness: Awake/alert Behavior During Therapy: WFL for tasks assessed/performed Overall Cognitive Status: Within Functional Limits for tasks assessed  General Comments      Exercises     Assessment/Plan    PT Assessment Patient needs continued PT services  PT Problem  List Decreased strength;Decreased balance;Decreased mobility;Decreased cognition;Decreased safety awareness       PT Treatment Interventions DME instruction;Gait training;Stair training;Functional mobility training;Therapeutic activities;Therapeutic exercise;Balance training;Neuromuscular re-education;Cognitive remediation;Patient/family education    PT Goals (Current goals can be found in the Care Plan section)  Acute Rehab PT Goals Patient Stated Goal: Go home PT Goal Formulation: With patient Time For Goal Achievement: 08/07/20 Potential to Achieve Goals: Good    Frequency Min 3X/week   Barriers to discharge        Co-evaluation               AM-PAC PT "6 Clicks" Mobility  Outcome Measure Help needed turning from your back to your side while in a flat bed without using bedrails?: None Help needed moving from lying on your back to sitting on the side of a flat bed without using bedrails?: None Help needed moving to and from a bed to a chair (including a wheelchair)?: None Help needed standing up from a chair using your arms (e.g., wheelchair or bedside chair)?: None Help needed to walk in hospital room?: A Little Help needed climbing 3-5 steps with a railing? : A Little 6 Click Score: 22    End of Session Equipment Utilized During Treatment: Gait belt Activity Tolerance: Patient tolerated treatment well Patient left: in bed;with call bell/phone within reach;with bed alarm set   PT Visit Diagnosis: Other abnormalities of gait and mobility (R26.89);Muscle weakness (generalized) (M62.81);Hemiplegia and hemiparesis;Difficulty in walking, not elsewhere classified (R26.2);Other symptoms and signs involving the nervous system (R29.898) Hemiplegia - Right/Left: Right Hemiplegia - dominant/non-dominant: Dominant Hemiplegia - caused by: Cerebral infarction    Time: 1287-8676 PT Time Calculation (min) (ACUTE ONLY): 21 min   Charges:   PT Evaluation $PT Eval Low Complexity:  1 Low          Elayne Snare, PT, DPT  Ellouise Newer 07/24/2020, 2:26 PM

## 2020-07-24 NOTE — Evaluation (Signed)
Clinical/Bedside Swallow Evaluation Patient Details  Name: Prabhjot Maddux MRN: [id number] Date of Birth: 1961/03/01  Today's Date: 07/24/2020 Time: SLP Start Time (ACUTE ONLY): 70 SLP Stop Time (ACUTE ONLY): 1422 SLP Time Calculation (min) (ACUTE ONLY): 13 min  Past Medical History:  Past Medical History:  Diagnosis Date  . 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 Cerebrovascular accident (stroke) ([place]) 2005  . Dental caries 01/27/2008  . GERD (gastroesophageal reflux disease) 11/27/2006  . Hyperhomocysteinemia ([place]) 11/23/2006   history of mild  . Hypertension 11/23/2006   Past Surgical History:  Past Surgical History:  Procedure Laterality Date  . CESAREAN SECTION    . cyst removed from right side  yrs ago  . EUS N/A 01/07/2014   Procedure: ESOPHAGEAL ENDOSCOPIC ULTRASOUND (EUS) RADIAL;  Surgeon: Arta Silence, MD;  Location: [facility];  Service: Endoscopy;  Laterality: N/A;  . FINE NEEDLE ASPIRATION N/A 01/07/2014   Procedure: FINE NEEDLE ASPIRATION (FNA) LINEAR;  Surgeon: Arta Silence, MD;  Location: [facility];  Service: Endoscopy;  Laterality: N/A;  . TUBAL LIGATION  yrs ago   HPI:  Jahnavi Muratore is a 59 y.o. female with medical history significant for prior CVA (2005) with mild right-sided deficit essential hypertension, GERD, microcytic anemia, anorexia who presented to [facility] ED from her [place] office due to confusion and worsened right-sided weakness. Per chart, husband reports cough since and significant weight loss for the last several months (Poor appetite and oral intake).MRI acute to early subacute anterior left frontal lobe infarct, left MCA distribution., multiple additional acute to early subacute ischemic infarcts. Chest x-ray showing left basilar infiltrates with suspicion for pneumonia and aspiration. No prior ST notes.   Assessment / Plan / Recommendation Clinical Impression  Prolonged mastication with solid textures  possibly due to xerostomia as there is no significant oral weakness. Mild lingual residue after recent consumption of lunch. No outward concern during multiple straw sips thin. Probable esophageal dysphagia as pt has history of GERD and noted to have intermittent eructation. Pt agrees that chopped meats may be beneficial and texture changed to Dys 3, continue thin, pills with thin and stay upright after meals. No further ST needed.        SLP Visit Diagnosis: Dysphagia, unspecified (R13.10)    Aspiration Risk  Mild aspiration risk    Diet Recommendation Dysphagia 3 (Mech soft);Thin liquid   Liquid Administration via: Straw;Cup Medication Administration: Whole meds with liquid Supervision: Patient able to self feed Compensations: Slow rate;Small sips/bites Postural Changes: Remain upright for at least 30 minutes after po intake;Seated upright at 90 degrees    Other  Recommendations Oral Care Recommendations: Oral care BID   Follow up Recommendations None      Frequency and Duration            Prognosis        Swallow Study   General Date of Onset: 07/23/20 HPI: Keelan Pomerleau is a 59 y.o. female with medical history significant for prior CVA (2005) with mild right-sided deficit essential hypertension, GERD, microcytic anemia, anorexia who presented to [facility] ED from her [place] office due to confusion and worsened right-sided weakness. Per chart, husband reports cough since and significant weight loss for the last several months (Poor appetite and oral intake).MRI acute to early subacute anterior left frontal lobe infarct, left MCA distribution., multiple additional acute to early subacute ischemic infarcts. Chest x-ray showing left basilar infiltrates with suspicion for pneumonia and aspiration. No prior  ST notes. Type of Study: Bedside Swallow Evaluation Previous Swallow Assessment: none Diet Prior to this Study: Thin liquids;Other (Comment) (soft) Temperature Spikes Noted:  No Respiratory Status: Room air History of Recent Intubation: No Behavior/Cognition: Alert;Cooperative;Pleasant mood Oral Cavity Assessment: Other (comment) (po residue on tongue, lingual candidias ) Oral Care Completed by SLP: Yes Oral Cavity - Dentition:  (missing some posterior) Vision: Functional for self-feeding Self-Feeding Abilities: Able to feed self Patient Positioning: Upright in bed Baseline Vocal Quality: Normal Volitional Cough: Weak Volitional Swallow: Able to elicit    Oral/Motor/Sensory Function Overall Oral Motor/Sensory Function: Mild impairment (trace on right) Facial ROM: Reduced right Facial Symmetry: Abnormal symmetry right   Ice Chips Ice chips: Not tested   Thin Liquid Thin Liquid: Within functional limits Presentation: Cup;Straw    Nectar Thick Nectar Thick Liquid: Not tested   Honey Thick Honey Thick Liquid: Not tested   Puree Puree: Within functional limits   Solid     Solid: Impaired Oral Phase Functional Implications: Prolonged oral transit      Houston Siren 07/24/2020,4:19 PM Orbie Pyo Colvin Caroli.Ed Risk analyst [phone] Office [phone]

## 2020-07-24 NOTE — Progress Notes (Signed)
STROKE TEAM PROGRESS NOTE   HISTORY OF PRESENT ILLNESS (per record) [name] is a 59 y.o. 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  When pressed on specific review of systems questions, she does endorse a cough for the last several days, not wanting to drink her Ensure for the last week or so resulting in increased weight loss, but she has not noticed having a fever.  She denies any other symptoms including headache, difficulty seeing (double vision or loss of vision), difficulty with hearing, nausea, vomiting, diarrhea, incontinence of stool or urine, tongue biting, new focal weakness or numbness.  Significant caveat to this review of systems given her disorientation, though she was able to state that she is here because her husband is concerned about her walking even though she does not have any concerns.  Regarding her stroke history, she was seen by Dr. Leonie Man in 2006. Workup was notable for elevated homocysteine and hypercoagulable panel was sent though results are difficult for me to access at this time.  On chart review her weight loss was noted by her PCP as early as 2008 and was attributed to her stroke (altered taste after her stroke and lack of desire to eat)  LKW: 9/10 evening tPA given?: No, due to out of the window   INTERVAL HISTORY Patient is sitting up in bed.  She has no complaints.  She has poor insight into her condition.  She looks emaciated and malnourished.  MRI scan of the brain shows by cerebral embolic-looking infarcts with the largest one being in the left anterior frontal cortex with gyriform appearance.  She also has old large right MCA infarct with  encephalomalacia.    OBJECTIVE Vitals:   07/23/20 2312 07/24/20 0235 07/24/20 0349 07/24/20 0738  BP: (!) 151/136 (!) 156/87 122/81 118/75  Pulse: (!) 110 (!) 102 100 (!) 103  Resp: 18 18 17 20   Temp: 99 F (37.2 C) 99.3 F (37.4 C) 99.3 F (37.4 C) 98.7 F (37.1 C)  TempSrc:  Oral Oral Oral  SpO2: 95% 100% 99% 98%    CBC:  Recent Labs  Lab 07/23/20 1649 07/24/20 0318  WBC 13.9* 12.4*  NEUTROABS 12.1* 9.0*  HGB 7.9* 7.2*  HCT 25.6* 23.7*  MCV 79.3* 79.8*  PLT 519* 406*    Basic Metabolic Panel:  Recent Labs  Lab 07/23/20 1649 07/24/20 0318 07/24/20 0709  NA 132* 133*  --   K 3.5 3.2*  --   CL 99 104  --   CO2 22 18*  --   GLUCOSE 154* 106*  --   BUN 7* 7*  --   CREATININE 0.76 0.57  --   CALCIUM 8.8* 8.0*  --   MG  --   --  2.3    Lipid Panel:     Component Value Date/Time   CHOL 119 07/23/2020 2026   TRIG 95 07/23/2020 2026   HDL 59 07/23/2020 2026   CHOLHDL 2.0 07/23/2020 2026   VLDL 19 07/23/2020 2026   LDLCALC 41 07/23/2020 2026   HgbA1c:  Lab Results  Component Value Date   HGBA1C 6.1 (H) 07/23/2020   Urine Drug Screen: No results found for: LABOPIA, [place], [place], AMPHETMU, THCU, LABBARB  Alcohol Level     Component Value Date/Time   ETH <10 07/23/2020 1649    IMAGING  CT Head Wo Contrast 07/23/2020 MPRESSION:  3.5 cm acute/early subacute cortical/subcortical infarct within the anterolateral left frontal lobe. Redemonstrated chronic right MCA vascular territory cortically based infarct. Chronic infarct within the posterior right thalamus. Background mild cerebral white matter chronic small vessel ischemic disease.   MR BRAIN WO CONTRAST MR ANGIO HEAD WO CONTRAST MR ANGIO NECK W WO CONTRAST 07/24/2020 IMPRESSION:   MRI HEAD  1. Acute to early subacute anterior left frontal lobe infarct, left MCA distribution, corresponding with abnormality from prior CT. No associated hemorrhage or mass effect.  2. Multiple additional  acute to early subacute ischemic infarcts involving the left frontal, parietal, and occipital lobes, watershed in distribution. Few additional punctate right cerebral cortical infarcts as above. No associated hemorrhage.  3. Underlying chronic right MCA infarct, with additional remote right thalamic infarct.  4. Underlying age-related cerebral atrophy.   MRA HEAD  1. Negative intracranial MRA for large vessel occlusion.  2. Mild diffuse atheromatous irregularity throughout the intracranial circulation. No proximal high-grade or correctable stenosis.   MRA NECK Negative MRA of the neck with wide patency of both carotid artery systems and vertebral arteries. No hemodynamically significant stenosis or other acute vascular abnormality.   DG Chest Port 1 View 07/23/2020 IMPRESSION:   Dense left basilar consolidation, compatible with pneumonia or aspiration. Followup PA and lateral chest X-ray is recommended in 3-4 weeks following trial of antibiotic therapy to ensure resolution and exclude underlying malignancy.   Transthoracic Echocardiogram  00/00/2021 Pending  ECG - ST rate 123 BPM. (See cardiology reading for complete details)   PHYSICAL EXAM Blood pressure 118/75, pulse (!) 103, temperature 98.7 F (37.1 C), temperature source Oral, resp. rate 20, SpO2 98 %. Frail malnourished looking middle-aged African-American lady not in distress. . Afebrile. Head is nontraumatic. Neck is supple without bruit.    Cardiac exam no murmur or gallop. Lungs are clear to auscultation. Distal pulses are well felt. Neurological Exam :  She is awake alert disoriented to time place and person.  Diminished attention, registration and recall.  Poor insight into her condition.  Speech is clear without dysarthria or aphasia.  She can name and repeat well.  She has some right gaze preference but able to look to the left past midline.  Left homonymous hemianopsia.  Fundi not visualized.  Mild right lower facial  asymmetry when she smiles.  Tongue midline.  Motor system exam able to move all 4 extremities against gravity upper extremities more than lower extremities but cooperation is variable and poor with effort inconsistent.  No apparent focal weakness.  Sensation appears intact bilaterally.  Deep tendon reflexes are symmetric.  Plantars downgoing.  Gait not tested.  ASSESSMENT/PLAN Ms. Jeanell Mangan is a 59 y.o. 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 Leonie Man in 2006 workup was notable for elevated homocysteine and hypercoagulable panel). hypertension, hx of abdominal mass 2006 felt to be benign, smoking, complicated by the development of anorexia, presenting with worsening weakness, difficulty walking, AMS, fever felt to be secondary to pneumonia, and anemia. She did not receive IV t-PA due to late presentation (>4.5 hours from time of onset).  Stroke: Acute to early subacute anterior left frontal lobe infarct, left MCA distribution -  A few additional punctate right cerebral cortical infarcts - embolic - source unknown.  Resultant altered mental status and encephalopathy.  Old left homonymous hemianopsia.  Code Stroke CT Head - not ordered   CT head - 3.5 cm acute/early subacute cortical/subcortical infarct within the anterolateral left frontal lobe. Redemonstrated chronic right MCA vascular territory cortically based infarct. Chronic infarct within the posterior right thalamus. Background mild cerebral white matter chronic small vessel ischemic disease.   MRI head - Acute to early subacute anterior left frontal lobe infarct, left MCA distribution, corresponding with abnormality from prior CT. No associated hemorrhage or mass effect. Multiple additional acute to early subacute ischemic infarcts involving the left frontal, parietal, and occipital lobes, watershed in distribution. Few additional punctate right cerebral cortical infarcts as  above. No associated hemorrhage. Underlying chronic right MCA infarct, with additional remote right thalamic infarct. Underlying age-related cerebral atrophy.   MRA head - Negative intracranial MRA for large vessel occlusion. Mild diffuse atheromatous irregularity throughout the intracranial circulation. No proximal high-grade or correctable stenosis.  MRA neck - Negative MRA of the neck with wide patency of both carotid artery systems and vertebral arteries. No hemodynamically significant stenosis or other acute vascular abnormality.   CTA H&N - not ordered  CT Perfusion - not ordered  Carotid Doppler - MRA neck ordered - carotid dopplers not indicated  2D Echo - pending  Sars Corona Virus 2 - negative  LDL - 41  HgbA1c - 6.1  UDS - pending  VTE prophylaxis - Lovenox Diet  Diet Order            DIET SOFT Room service appropriate? Yes; Fluid consistency: Thin  Diet effective now                 dipyridamole SR 250 mg/aspirin 25 mg twice a day prior to admission, now on aspirin 325 mg daily  Patient will be counseled to be compliant with her antithrombotic medications  Ongoing aggressive stroke risk factor management  Therapy recommendations:  pending  Disposition:  Pending  Hypertension  Home BP meds: Lisinopril  Current BP meds: none   Stable . Permissive hypertension (OK if < 220/120) but gradually normalize in 5-7 days  . Long-term BP goal normotensive  Hyperlipidemia  Home Lipid lowering medication: none   LDL 41, goal < 70  Current lipid lowering medication: Lipitor 80 mg daily   Continue statin at discharge  Pre Diabetes ?  Home diabetic meds: none   Current diabetic meds: SSI   HgbA1c 6.1, goal < 7.0 Recent Labs    07/24/20 0238 07/24/20 0348 07/24/20 0725  GLUCAP 106* 104* 99    Other Stroke Risk Factors  Advanced age  Former cigarette smoker - quit  ETOH use, advised to drink no more than 1 alcoholic beverage per  day.  Obesity, There is no height or weight on file to calculate BMI., recommend weight loss, diet and exercise as appropriate   Hx stroke/TIA  Other Active Problems  Code status - Full Code  Malnutrition - Ensure PTA   Suspected pneumonia - Zithromax and Rocephin started 07/23/20. Lactic acid 1.5 (WNL)  CXR - Dense left basilar consolidation, compatible with pneumonia or aspiration. Followup PA and lateral chest X-ray is recommended in 3-4 weeks following trial of antibiotic therapy to ensure resolution and exclude underlying malignancy.   Tachycardia - likely related to fever and anemia  Anemia - Hgb - 7.2 (stool for occult blood pending)  Leukocytosis - WBC's - 13.9->12.4  ( temp - 99.3 )   Urine and blood cultures - pending  Hypokalemia - potassium - 3.5->3.2  Hyponatremia - Na - 132->133  Hospital day # 1  She has presented with altered mental status with MRI scan showing multiple by cerebral infarcts with largest 1 being the left frontal region etiology likely from central cardiac source.  She has remote history of large right MCA infarct in 2006 which was presumably of cryptogenic etiology and then she was lost to follow-up.  Recommend continue ongoing stroke work-up.  Check urine drug screen and echocardiogram.  She will likely need TEE and loop recorder.  Transcranial Doppler bubble study for PFO and lower extremity venous Dopplers for DVT.  Check anticardiolipin antibodies and ANA panel.  Continue aspirin and Plavix for now no family available at bedside for discussion.  Discussed with Dr. Avon Gully.  Greater than 50% time during this 35-minute visit was spent on counseling and coordination of care about her multiple embolic strokes and discussion about evaluation treatment plan and answering questions  Antony Contras, MD  To contact Stroke Continuity provider, please refer to [email or web address] After hours, contact General Neurology

## 2020-07-24 NOTE — Progress Notes (Addendum)
PROGRESS NOTE    [name]  PZW:[id number] DOB: 1961-09-27 DOA: 07/23/2020 PCP: London Pepper, MD   Brief Narrative:  [name] is a 59 y.o. female with medical history significant for prior CVA with mild right-sided deficit not on antiplatelets for the past year, essential hypertension, GERD, microcytic anemia who presented to [facility] ED from her [place] office due to confusion and worsened right-sided weakness.  Last known well last night prior to bed.  Her husband reports new cough since last night and significant weight loss for the last several months.  Poor appetite and poor oral intake.  Denies use of NSAIDs, abdominal pain, diarrhea or constipation.  She was sent to the ED for further evaluation and management of her right sided weaness. In ED: patient noted to be febrile with T-max 102.9, heart rate 121, respiration rate of 29.  Chest x-ray showing left basilar infiltrates with suspicion for pneumonia and aspiration.  Lab studies remarkable for WBC 13.9K, hemoglobin 7.9K and MCV of 79.  Neutrophilia 12.1.  TRH was asked to admit.  Assessment & Plan:   Active Problems:   Essential hypertension   Acute CVA (cerebrovascular accident) ([place])   Acute metabolic encephalopathy   Iron deficiency anemia due to chronic blood loss   Severe protein-calorie malnutrition Altamease Oiler: less than 60% of standard weight) (HCC)   Acute on questionably recurrent multifocal strokes, most prominently noted in left frontal lobe, unclear etiology, POA  - Seen on CT head, also showing chronic infarct within the posterior right thalamus. - MRI brain, MRA head and neck -multiple acute to early subacute ischemic infarcts of various lobes including frontal parietal and occipital. -Neurology following, appreciate insight and recommendations -Echocardiogram pending defer to neurology for possible need for TEE -Undergoing hypercoagulable workup in outpatient setting per neuro documentation -Continue ASA 325 mg daily  and Lipitor 80 mg daily -Lipid panel and A1c within normal limits  -PT/OT/speech therapy evaluation following per protocol  Sepsis secondary to left lower lobe community-acquired pneumonia with possible aspiration, POA -Presented with fever T-max of 102.9, tachycardia 121, tachypnea 29 and left lower lobe infiltrates on chest x-ray suggestive of pneumonia - UA pending -Continue to follow cultures -Continue empiric coverage with Rocephin and azithromycin  Acute metabolic encephalopathy in the setting of acute CVA and sepsis secondary to pneumonia, POA, resolving -UA remains pending -Patient's mental status is improving drastically over the past 24 hours per husband at bedside   Chronic microcytic anemia, in the setting of protein caloric malnutrition and severe iron deficiency - Hemoglobin slowly downtrending, continue to follow clinically  - No overt bleeding, continue to follow clinically -Continue to advance diet, supplement iron as tolerated with diet and ferrous sulfate    Component Value Date/Time   IRON <5 (L) 07/23/2020 2030   TIBC 150 (L) 07/23/2020 2030   FERRITIN 117 07/23/2020 2030   IRONPCTSAT NOT CALCULATED 07/23/2020 2030   Severe protein caloric malnutrition -TSH within normal limits -Without clear etiology, continue to advance diet as tolerated  Hyperglycemia, without diabetes diagnosis - A1C 6.1 as above - Continue sliding scale insulin  History of CVA - Per the patient and her husband at bedside, she stopped taking home Aggrenox for 1 year. - Not on statin at home, no neurology outpatient follow up  Hypokalemia, mild  -Previously repleted, follow morning labs  Essential hypertension -Permissive hypertension window closed, continue close monitoring -Resume antihypertensive medication as indicated  Hyperlipidemia -Lipid panel essentially within normal limits -Continue home atorvastatin 80 mg daily  Ambulatory dysfunction secondary to CVA as  above PT OT to assess Fall precautions   DVT prophylaxis: Lovenox Code Status: Full Family Communication: Husband at bedside  Status is: Inpatient  Dispo: The patient is from: Home              Anticipated d/c is to: Home              Anticipated d/c date is: 24 to 48 hours pending clinical course              Patient currently not medically stable for discharge due to ongoing mental status changes from baseline, poor p.o. intake, need for close monitoring in the setting of acute infection requiring IV antibiotics  Consultants:   Neurology  Procedures:   None  Antimicrobials:  Azithromycin, ceftriaxone  Subjective: No acute issues or events overnight, review of systems still somewhat limited given patient's mental status but husband at bedside indicates she appears much more calm and back to her baseline this morning, patient denies nausea, vomiting, constipation, headache, fevers, chills.  Objective: Vitals:   07/24/20 0235 07/24/20 0349 07/24/20 0738 07/24/20 1232  BP: (!) 156/87 122/81 118/75 108/67  Pulse: (!) 102 100 (!) 103 (!) 109  Resp: 18 17 20 20   Temp: 99.3 F (37.4 C) 99.3 F (37.4 C) 98.7 F (37.1 C) 98.4 F (36.9 C)  TempSrc: Oral Oral Oral Oral  SpO2: 100% 99% 98% 100%    Intake/Output Summary (Last 24 hours) at 07/24/2020 1408 Last data filed at 07/24/2020 1306 Gross per 24 hour  Intake 1966.95 ml  Output --  Net 1966.95 ml   There were no vitals filed for this visit.  Examination:  General:  Pleasantly resting in bed, No acute distress.  Cachectic HEENT:  Normocephalic atraumatic.  Sclerae nonicteric, noninjected.  Extraocular movements intact bilaterally. Neck:  Without mass or deformity.  Trachea is midline. Lungs:  Clear to auscultate bilaterally without rhonchi, wheeze, or rales. Heart:  Regular rate and rhythm.  Without murmurs, rubs, or gallops. Abdomen:  Soft, nontender, nondistended.  Without guarding or rebound. Extremities:  Without cyanosis, clubbing, edema, or obvious deformity. Vascular:  Dorsalis pedis and posterior tibial pulses palpable bilaterally. Skin:  Warm and dry, no erythema, no ulcerations.   Data Reviewed: I have personally reviewed following labs and imaging studies  CBC: Recent Labs  Lab 07/23/20 1649 07/24/20 0318  WBC 13.9* 12.4*  NEUTROABS 12.1* 9.0*  HGB 7.9* 7.2*  HCT 25.6* 23.7*  MCV 79.3* 79.8*  PLT 519* 314*   Basic Metabolic Panel: Recent Labs  Lab 07/23/20 1649 07/24/20 0318 07/24/20 0709  NA 132* 133*  --   K 3.5 3.2*  --   CL 99 104  --   CO2 22 18*  --   GLUCOSE 154* 106*  --   BUN 7* 7*  --   CREATININE 0.76 0.57  --   CALCIUM 8.8* 8.0*  --   MG  --   --  2.3   GFR: CrCl cannot be calculated (Unknown ideal weight.). Liver Function Tests: Recent Labs  Lab 07/23/20 1649 07/24/20 0318  AST 9* 12*  ALT 6 7  ALKPHOS 87 70  BILITOT 0.5 0.4  PROT 7.7 6.3*  ALBUMIN 1.9* 1.5*   No results for input(s): LIPASE, AMYLASE in the last 168 hours. No results for input(s): AMMONIA in the last 168 hours. Coagulation Profile: Recent Labs  Lab 07/23/20 1649  INR 1.2   Cardiac Enzymes: No results for  input(s): CKTOTAL, CKMB, CKMBINDEX, TROPONINI in the last 168 hours. BNP (last 3 results) No results for input(s): PROBNP in the last 8760 hours. HbA1C: Recent Labs    07/23/20 2026  HGBA1C 6.1*   CBG: Recent Labs  Lab 07/23/20 2127 07/24/20 0238 07/24/20 0348 07/24/20 0725 07/24/20 1047  GLUCAP 126* 106* 104* 99 120*   Lipid Profile: Recent Labs    07/23/20 2026  CHOL 119  HDL 59  LDLCALC 41  TRIG 95  CHOLHDL 2.0   Thyroid Function Tests: Recent Labs    07/23/20 2200  TSH 0.613   Anemia Panel: Recent Labs    07/23/20 2030 07/23/20 2100  VITAMINB12 275  --   FOLATE  --  9.6  FERRITIN 117  --   TIBC 150*  --   IRON <5*  --    Sepsis Labs: Recent Labs  Lab 07/23/20 1808  LATICACIDVEN 1.5    Recent Results (from the past 240  hour(s))  SARS Coronavirus 2 by RT PCR (hospital order, performed in [place] hospital lab) Nasopharyngeal Nasopharyngeal Swab     Status: None   Collection Time: 07/23/20  5:39 PM   Specimen: Nasopharyngeal Swab  Result Value Ref Range Status   SARS Coronavirus 2 NEGATIVE NEGATIVE Final    Comment: (NOTE) SARS-CoV-2 target nucleic acids are NOT DETECTED.  The SARS-CoV-2 RNA is generally detectable in upper and lower respiratory specimens during the acute phase of infection. The lowest concentration of SARS-CoV-2 viral copies this assay can detect is 250 copies / mL. A negative result does not preclude SARS-CoV-2 infection and should not be used as the sole basis for treatment or other patient management decisions.  A negative result may occur with improper specimen collection / handling, submission of specimen other than nasopharyngeal swab, presence of viral mutation(s) within the areas targeted by this assay, and inadequate number of viral copies (<250 copies / mL). A negative result must be combined with clinical observations, patient history, and epidemiological information.  Fact Sheet for Patients:   StrictlyIdeas.no  Fact Sheet for Healthcare Providers: BankingDealers.co.za  This test is not yet approved or  cleared by the [place] FDA and has been authorized for detection and/or diagnosis of SARS-CoV-2 by FDA under an Emergency Use Authorization (EUA).  This EUA will remain in effect (meaning this test can be used) for the duration of the COVID-19 declaration under Section 564(b)(1) of the Act, 21 U.S.C. section 360bbb-3(b)(1), unless the authorization is terminated or revoked sooner.  Performed at [facility], [place] [address] [place], [place] [address]   Blood culture (routine single)     Status: None (Preliminary result)   Collection Time: 07/23/20  6:09 PM   Specimen: BLOOD  Result Value Ref Range Status    Specimen Description BLOOD SITE NOT SPECIFIED  Final   Special Requests   Final    BOTTLES DRAWN AEROBIC AND ANAEROBIC Blood Culture adequate volume   Culture   Final    NO GROWTH < 24 HOURS Performed at [facility], [place] [address], [place], [place] [address]    Report Status PENDING  Incomplete  Culture, blood (single)     Status: None (Preliminary result)   Collection Time: 07/23/20  6:09 PM   Specimen: BLOOD  Result Value Ref Range Status   Specimen Description BLOOD SITE NOT SPECIFIED  Final   Special Requests   Final    BOTTLES DRAWN AEROBIC ONLY Blood Culture results may not be  optimal due to an inadequate volume of blood received in culture bottles   Culture   Final    NO GROWTH < 24 HOURS Performed at [facility] [address], [place], [place] [address]    Report Status PENDING  Incomplete         Radiology Studies: CT Head Wo Contrast  Result Date: 07/23/2020 CLINICAL DATA:  Neuro deficit, acute, stroke suspected. Additional history provided: Sudden onset unsteady gait, stroke 2 years ago affecting right side EXAM: CT HEAD WITHOUT CONTRAST TECHNIQUE: Contiguous axial images were obtained from the base of the skull through the vertex without intravenous contrast. COMPARISON:  Noncontrast head CT 01/21/2005 FINDINGS: Brain: There is a 3.5 cm focus of cortical/subcortical hypodensity within the anterolateral left frontal lobe consistent with acute/early subacute infarction (for instance as seen on series 3, image 15). There is no significant mass effect at this time. Redemonstrated chronic cortically based infarct predominantly affecting portions of the right temporal and parietal lobes as well as posterior right insula. Chronic infarction changes also affect the posterior right thalamus. Background mild ill-defined hypoattenuation within the cerebral white matter is nonspecific, but consistent with chronic small vessel ischemic disease. There is no acute  intracranial hemorrhage. No extra-axial fluid collection. No evidence of intracranial mass. Vascular:No hyperdense vessel.  Atherosclerotic calcifications. Skull: Normal. Negative for fracture or focal lesion. Sinuses/Orbits: Visualized orbits show no acute finding. Minimal scattered paranasal sinus mucosal thickening. No significant mastoid effusion. These results were called by telephone at the time of interpretation on 07/23/2020 at 6:48 pm to provider [facility] , who verbally acknowledged these results. IMPRESSION: 3.5 cm acute/early subacute cortical/subcortical infarct within the anterolateral left frontal lobe. Redemonstrated chronic right MCA vascular territory cortically based infarct. Chronic infarct within the posterior right thalamus. Background mild cerebral white matter chronic small vessel ischemic disease. Electronically Signed   By: Kellie Simmering DO   On: 07/23/2020 18:50   MR ANGIO HEAD WO CONTRAST  Result Date: 07/24/2020 CLINICAL DATA:  Follow-up examination for acute stroke. EXAM: MRI HEAD WITHOUT CONTRAST MRA HEAD WITHOUT CONTRAST MRA NECK WITHOUT AND WITH CONTRAST TECHNIQUE: Multiplanar, multiecho pulse sequences of the brain and surrounding structures were obtained without intravenous contrast. Angiographic images of the Circle of Willis were obtained using MRA technique without intravenous contrast. Angiographic images of the neck were obtained using MRA technique without and with intravenous contrast. Carotid stenosis measurements (when applicable) are obtained utilizing NASCET criteria, using the distal internal carotid diameter as the denominator. CONTRAST:  61mL GADAVIST GADOBUTROL 1 MMOL/ML IV SOLN COMPARISON:  Prior CT from 07/23/2020. FINDINGS: MRI HEAD FINDINGS Brain: Examination mildly degraded by motion artifact. Diffuse prominence of the CSF containing spaces compatible generalized age-related cerebral atrophy. Area of encephalomalacia and gliosis involving the right  parietotemporal region compatible with a chronic right MCA territory infarct. Associated mild chronic hemosiderin staining within this region. Confluent wedge-shaped area of restricted diffusion measuring up to 3.5 cm in diameter seen involving the anterior left frontal lobe, corresponding with abnormality on prior CT. Finding consistent with and ischemic infarct, acute to early subacute in appearance. Multiple additional scattered acute to early subacute ischemic infarcts seen involving the cortical and subcortical left frontal, parietal, and occipital lobes, somewhat watershed in distribution. Few additional punctate acute to early subacute ischemic cortical infarct noted involving the right frontal lobe (series 5, images 86, 85). No associated hemorrhage or mass effect about these infarcts. Gray-white matter differentiation otherwise maintained. No acute intracranial hemorrhage. No mass lesion  or midline shift. Mild ex vacuo dilatation of the right lateral ventricle related to the chronic right MCA territory infarct. No hydrocephalus. No extra-axial fluid collection. Pituitary gland suprasellar region within normal limits. Vascular: Major intracranial vascular flow voids are maintained. Skull and upper cervical spine: Craniocervical junction within normal limits. Bone marrow signal intensity normal. No scalp soft tissue abnormality. Sinuses/Orbits: Globes and orbital soft tissues within normal limits. Mild scattered mucosal thickening noted within the ethmoidal air cells and maxillary sinuses. Paranasal sinuses are otherwise clear. No mastoid effusion. Inner ear structures grossly normal. Other: None. MRA HEAD FINDINGS ANTERIOR CIRCULATION: Examination mildly degraded by motion. Visualized distal cervical segments of the internal carotid arteries are patent with antegrade flow. Scattered atheromatous irregularity throughout the carotid siphons without flow-limiting stenosis. ICA termini well perfused. A1 segments  patent bilaterally. Normal anterior communicating artery complex. Anterior cerebral arteries patent distally without significant stenosis. No M1 stenosis or occlusion. Negative MCA bifurcations. No proximal MCA branch occlusion. Scattered atheromatous irregularity throughout the distal MCA branches bilaterally. Right MCA branches somewhat attenuated as compared to the left, in keeping with the chronic right MCA territory infarct. POSTERIOR CIRCULATION: Vertebral arteries patent to the vertebrobasilar junction without stenosis. Right vertebral artery slightly dominant. Both picas patent proximally. Basilar mildly irregular but widely patent to its distal aspect without stenosis. Superior cerebral arteries patent bilaterally. 2 mm somewhat funnel shaped outpouching at the origin of the left SCA favored to reflect a vascular infundibulum. Both PCA supplied via the basilar or as well as prominent bilateral posterior communicating arteries. PCAs mildly irregular without proximal high-grade P1 and P2 stenosis. Focal severe distal right P3 stenosis noted (series 1224, image 5). No intracranial aneurysm. MRA NECK FINDINGS AORTIC ARCH: Visualized aortic arch of normal caliber. Bovine branching pattern with common origin of the right brachiocephalic and left common carotid arteries noted. No flow-limiting stenosis about the origin of the great vessels. RIGHT CAROTID SYSTEM: Right common carotid artery widely patent from its origin to the bifurcation. No significant atheromatous irregularity or narrowing about the right bifurcation. Right ICA widely patent distally without stenosis, dissection or occlusion. LEFT CAROTID SYSTEM: Left common carotid artery patent from its origin to the bifurcation without stenosis. Minimal atheromatous irregularity about the left bifurcation without significant stenosis. Left ICA widely patent distally without stenosis, dissection, or occlusion. VERTEBRAL ARTERIES: Both vertebral arteries arise  from the subclavian arteries. No proximal subclavian artery stenosis. Vertebral arteries widely patent within the neck without stenosis, dissection or occlusion. IMPRESSION: MRI HEAD IMPRESSION: 1. Acute to early subacute anterior left frontal lobe infarct, left MCA distribution, corresponding with abnormality from prior CT. No associated hemorrhage or mass effect. 2. Multiple additional acute to early subacute ischemic infarcts involving the left frontal, parietal, and occipital lobes, watershed in distribution. Few additional punctate right cerebral cortical infarcts as above. No associated hemorrhage. 3. Underlying chronic right MCA infarct, with additional remote right thalamic infarct. 4. Underlying age-related cerebral atrophy. MRA HEAD IMPRESSION: 1. Negative intracranial MRA for large vessel occlusion. 2. Mild diffuse atheromatous irregularity throughout the intracranial circulation. No proximal high-grade or correctable stenosis. MRA NECK IMPRESSION: Negative MRA of the neck with wide patency of both carotid artery systems and vertebral arteries. No hemodynamically significant stenosis or other acute vascular abnormality. Electronically Signed   By: Jeannine Boga M.D.   On: 07/24/2020 02:40   MR ANGIO NECK W WO CONTRAST  Result Date: 07/24/2020 CLINICAL DATA:  Follow-up examination for acute stroke. EXAM: MRI HEAD WITHOUT CONTRAST MRA HEAD WITHOUT  CONTRAST MRA NECK WITHOUT AND WITH CONTRAST TECHNIQUE: Multiplanar, multiecho pulse sequences of the brain and surrounding structures were obtained without intravenous contrast. Angiographic images of the Circle of Willis were obtained using MRA technique without intravenous contrast. Angiographic images of the neck were obtained using MRA technique without and with intravenous contrast. Carotid stenosis measurements (when applicable) are obtained utilizing NASCET criteria, using the distal internal carotid diameter as the denominator. CONTRAST:  36mL  GADAVIST GADOBUTROL 1 MMOL/ML IV SOLN COMPARISON:  Prior CT from 07/23/2020. FINDINGS: MRI HEAD FINDINGS Brain: Examination mildly degraded by motion artifact. Diffuse prominence of the CSF containing spaces compatible generalized age-related cerebral atrophy. 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 Associated mild chronic hemosiderin staining within this region. Confluent wedge-shaped area of restricted diffusion measuring up to 3.5 cm in diameter seen involving the anterior left frontal lobe, corresponding with abnormality on prior CT. Finding consistent with and ischemic infarct, acute to early subacute in appearance. Multiple additional scattered acute to early subacute ischemic infarcts seen involving the cortical and subcortical left frontal, parietal, and occipital lobes, somewhat watershed in distribution. Few additional punctate acute to early subacute ischemic cortical infarct noted involving the right frontal lobe (series 5, images 86, 85). No associated hemorrhage or mass effect about these infarcts. Gray-white matter differentiation otherwise maintained. No acute intracranial hemorrhage. No mass lesion or midline shift. Mild ex vacuo dilatation of the right lateral ventricle related to the chronic right MCA territory infarct. No hydrocephalus. No extra-axial fluid collection. Pituitary gland suprasellar region within normal limits. Vascular: Major intracranial vascular flow voids are maintained. Skull and upper cervical spine: Craniocervical junction within normal limits. Bone marrow signal intensity normal. No scalp soft tissue abnormality. Sinuses/Orbits: Globes and orbital soft tissues within normal limits. Mild scattered mucosal thickening noted within the ethmoidal air cells and maxillary sinuses. Paranasal sinuses are otherwise clear. No mastoid effusion. Inner ear structures grossly normal. Other: None.  MRA HEAD FINDINGS ANTERIOR CIRCULATION: Examination mildly degraded by motion. Visualized distal cervical segments of the internal carotid arteries are patent with antegrade flow. Scattered atheromatous irregularity throughout the carotid siphons without flow-limiting stenosis. ICA termini well perfused. A1 segments patent bilaterally. Normal anterior communicating artery complex. Anterior cerebral arteries patent distally without significant stenosis. No M1 stenosis or occlusion. Negative MCA bifurcations. No proximal MCA branch occlusion. Scattered atheromatous irregularity throughout the distal MCA branches bilaterally. Right MCA branches somewhat attenuated as compared to the left, in keeping with the chronic right MCA territory infarct. POSTERIOR CIRCULATION: Vertebral arteries patent to the vertebrobasilar junction without stenosis. Right vertebral artery slightly dominant. Both picas patent proximally. Basilar mildly irregular but widely patent to its distal aspect without stenosis. Superior cerebral arteries patent bilaterally. 2 mm somewhat funnel shaped outpouching at the origin of the left SCA favored to reflect a vascular infundibulum. Both PCA supplied via the basilar or as well as prominent bilateral posterior communicating arteries. PCAs mildly irregular without proximal high-grade P1 and P2 stenosis. Focal severe distal right P3 stenosis noted (series 1224, image 5). No intracranial aneurysm. MRA NECK FINDINGS AORTIC ARCH: Visualized aortic arch of normal caliber. Bovine branching pattern with common origin of the right brachiocephalic and left common carotid arteries noted. No flow-limiting stenosis about the origin of the great vessels. RIGHT CAROTID SYSTEM: Right common carotid artery widely patent from its origin to the bifurcation. No significant atheromatous irregularity or narrowing about the right bifurcation. Right ICA widely patent distally without  stenosis, dissection or occlusion. LEFT  CAROTID SYSTEM: Left common carotid artery patent from its origin to the bifurcation without stenosis. Minimal atheromatous irregularity about the left bifurcation without significant stenosis. Left ICA widely patent distally without stenosis, dissection, or occlusion. VERTEBRAL ARTERIES: Both vertebral arteries arise from the subclavian arteries. No proximal subclavian artery stenosis. Vertebral arteries widely patent within the neck without stenosis, dissection or occlusion. IMPRESSION: MRI HEAD IMPRESSION: 1. Acute to early subacute anterior left frontal lobe infarct, left MCA distribution, corresponding with abnormality from prior CT. No associated hemorrhage or mass effect. 2. Multiple additional acute to early subacute ischemic infarcts involving the left frontal, parietal, and occipital lobes, watershed in distribution. Few additional punctate right cerebral cortical infarcts as above. No associated hemorrhage. 3. Underlying chronic right MCA infarct, with additional remote right thalamic infarct. 4. Underlying age-related cerebral atrophy. MRA HEAD IMPRESSION: 1. Negative intracranial MRA for large vessel occlusion. 2. Mild diffuse atheromatous irregularity throughout the intracranial circulation. No proximal high-grade or correctable stenosis. MRA NECK IMPRESSION: Negative MRA of the neck with wide patency of both carotid artery systems and vertebral arteries. No hemodynamically significant stenosis or other acute vascular abnormality. Electronically Signed   By: Jeannine Boga M.D.   On: 07/24/2020 02:40   MR BRAIN WO CONTRAST  Result Date: 07/24/2020 CLINICAL DATA:  Follow-up examination for acute stroke. EXAM: MRI HEAD WITHOUT CONTRAST MRA HEAD WITHOUT CONTRAST MRA NECK WITHOUT AND WITH CONTRAST TECHNIQUE: Multiplanar, multiecho pulse sequences of the brain and surrounding structures were obtained without intravenous contrast. Angiographic images of the Circle of Willis were obtained using MRA  technique without intravenous contrast. Angiographic images of the neck were obtained using MRA technique without and with intravenous contrast. Carotid stenosis measurements (when applicable) are obtained utilizing NASCET criteria, using the distal internal carotid diameter as the denominator. CONTRAST:  4mL GADAVIST GADOBUTROL 1 MMOL/ML IV SOLN COMPARISON:  Prior CT from 07/23/2020. FINDINGS: MRI HEAD FINDINGS Brain: Examination mildly degraded by motion artifact. Diffuse prominence of the CSF containing spaces compatible generalized age-related cerebral atrophy. 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 Associated mild chronic hemosiderin staining within this region. Confluent wedge-shaped area of restricted diffusion measuring up to 3.5 cm in diameter seen involving the anterior left frontal lobe, corresponding with abnormality on prior CT. Finding consistent with and ischemic infarct, acute to early subacute in appearance. Multiple additional scattered acute to early subacute ischemic infarcts seen involving the cortical and subcortical left frontal, parietal, and occipital lobes, somewhat watershed in distribution. Few additional punctate acute to early subacute ischemic cortical infarct noted involving the right frontal lobe (series 5, images 86, 85). No associated hemorrhage or mass effect about these infarcts. Gray-white matter differentiation otherwise maintained. No acute intracranial hemorrhage. No mass lesion or midline shift. Mild ex vacuo dilatation of the right lateral ventricle related to the chronic right MCA territory infarct. No hydrocephalus. No extra-axial fluid collection. Pituitary gland suprasellar region within normal limits. Vascular: Major intracranial vascular flow voids are maintained. Skull and upper cervical spine: Craniocervical junction within normal limits. Bone marrow signal intensity  normal. No scalp soft tissue abnormality. Sinuses/Orbits: Globes and orbital soft tissues within normal limits. Mild scattered mucosal thickening noted within the ethmoidal air cells and maxillary sinuses. Paranasal sinuses are otherwise clear. No mastoid effusion. Inner ear structures grossly normal. Other: None. MRA HEAD FINDINGS ANTERIOR CIRCULATION: Examination mildly degraded by motion. Visualized distal cervical segments of the internal carotid arteries  are patent with antegrade flow. Scattered atheromatous irregularity throughout the carotid siphons without flow-limiting stenosis. ICA termini well perfused. A1 segments patent bilaterally. Normal anterior communicating artery complex. Anterior cerebral arteries patent distally without significant stenosis. No M1 stenosis or occlusion. Negative MCA bifurcations. No proximal MCA branch occlusion. Scattered atheromatous irregularity throughout the distal MCA branches bilaterally. Right MCA branches somewhat attenuated as compared to the left, in keeping with the chronic right MCA territory infarct. POSTERIOR CIRCULATION: Vertebral arteries patent to the vertebrobasilar junction without stenosis. Right vertebral artery slightly dominant. Both picas patent proximally. Basilar mildly irregular but widely patent to its distal aspect without stenosis. Superior cerebral arteries patent bilaterally. 2 mm somewhat funnel shaped outpouching at the origin of the left SCA favored to reflect a vascular infundibulum. Both PCA supplied via the basilar or as well as prominent bilateral posterior communicating arteries. PCAs mildly irregular without proximal high-grade P1 and P2 stenosis. Focal severe distal right P3 stenosis noted (series 1224, image 5). No intracranial aneurysm. MRA NECK FINDINGS AORTIC ARCH: Visualized aortic arch of normal caliber. Bovine branching pattern with common origin of the right brachiocephalic and left common carotid arteries noted. No  flow-limiting stenosis about the origin of the great vessels. RIGHT CAROTID SYSTEM: Right common carotid artery widely patent from its origin to the bifurcation. No significant atheromatous irregularity or narrowing about the right bifurcation. Right ICA widely patent distally without stenosis, dissection or occlusion. LEFT CAROTID SYSTEM: Left common carotid artery patent from its origin to the bifurcation without stenosis. Minimal atheromatous irregularity about the left bifurcation without significant stenosis. Left ICA widely patent distally without stenosis, dissection, or occlusion. VERTEBRAL ARTERIES: Both vertebral arteries arise from the subclavian arteries. No proximal subclavian artery stenosis. Vertebral arteries widely patent within the neck without stenosis, dissection or occlusion. IMPRESSION: MRI HEAD IMPRESSION: 1. Acute to early subacute anterior left frontal lobe infarct, left MCA distribution, corresponding with abnormality from prior CT. No associated hemorrhage or mass effect. 2. Multiple additional acute to early subacute ischemic infarcts involving the left frontal, parietal, and occipital lobes, watershed in distribution. Few additional punctate right cerebral cortical infarcts as above. No associated hemorrhage. 3. Underlying chronic right MCA infarct, with additional remote right thalamic infarct. 4. Underlying age-related cerebral atrophy. MRA HEAD IMPRESSION: 1. Negative intracranial MRA for large vessel occlusion. 2. Mild diffuse atheromatous irregularity throughout the intracranial circulation. No proximal high-grade or correctable stenosis. MRA NECK IMPRESSION: Negative MRA of the neck with wide patency of both carotid artery systems and vertebral arteries. No hemodynamically significant stenosis or other acute vascular abnormality. Electronically Signed   By: Jeannine Boga M.D.   On: 07/24/2020 02:40   DG Chest Port 1 View  Result Date: 07/23/2020 CLINICAL DATA:  Sepsis  EXAM: PORTABLE CHEST 1 VIEW COMPARISON:  01/19/2005 FINDINGS: Single frontal view of the chest demonstrates dense consolidation at the left lung base compatible with pneumonia or aspiration. Diffuse interstitial prominence elsewhere within the lungs likely reflects scarring. No effusion or pneumothorax. There is elevation of the left hemidiaphragm. The cardiac silhouette is unremarkable. IMPRESSION: 1. Dense left basilar consolidation, compatible with pneumonia or aspiration. Followup PA and lateral chest X-ray is recommended in 3-4 weeks following trial of antibiotic therapy to ensure resolution and exclude underlying malignancy. Electronically Signed   By: Randa Ngo M.D.   On: 07/23/2020 16:55        Scheduled Meds: . aspirin EC  325 mg Oral Daily  . atorvastatin  80 mg Oral Daily  . cyanocobalamin  1,000 mcg Subcutaneous Daily   Followed by  . [START ON 07/31/2020] vitamin B-12  1,000 mcg Oral Daily  . dextromethorphan-guaiFENesin  2 tablet Oral BID  . enoxaparin (LOVENOX) injection  40 mg Subcutaneous Q24H  . insulin aspart  0-6 Units Subcutaneous TID AC & HS  . multivitamin with minerals  1 tablet Oral Daily  . pantoprazole  40 mg Oral Daily  . [START ON 07/27/2020] thiamine injection  100 mg Intravenous Daily   Continuous Infusions: . sodium chloride 75 mL/hr at 07/24/20 1304  . sodium chloride 10 mL/hr at 07/24/20 1304  . azithromycin Stopped (07/23/20 2114)  . cefTRIAXone (ROCEPHIN)  IV Stopped (07/23/20 2001)  . thiamine injection 400 mg (07/24/20 1306)     LOS: 1 day    Time spent: 61min  Raesha Coonrod C Tanika Bracco, DO [facility]  Pager: Secure chat  If 7PM-7AM, please contact night-coverage [email or web address]  07/24/2020, 2:08 PM

## 2020-07-24 NOTE — Progress Notes (Signed)
Pt have x4 diarrhea bm with liquid and no solid. MD notified and ordered to give pt yogurt as it could be from antibiotics. Pt given yogurt and ensure as pt has decreased appetite. Delia Heady RN

## 2020-07-24 NOTE — Progress Notes (Signed)
  Echocardiogram 2D Echocardiogram has been performed.  [name] 07/24/2020, 3:57 PM

## 2020-07-25 LAB — CBC
HCT: 23.7 % — ABNORMAL LOW (ref 36.0–46.0)
Hemoglobin: 7.1 g/dL — ABNORMAL LOW (ref 12.0–15.0)
MCH: 24.1 pg — ABNORMAL LOW (ref 26.0–34.0)
MCHC: 30 g/dL (ref 30.0–36.0)
MCV: 80.6 fL (ref 80.0–100.0)
Platelets: 497 10*3/uL — ABNORMAL HIGH (ref 150–400)
RBC: 2.94 MIL/uL — ABNORMAL LOW (ref 3.87–5.11)
RDW: 17.5 % — ABNORMAL HIGH (ref 11.5–15.5)
WBC: 15.6 10*3/uL — ABNORMAL HIGH (ref 4.0–10.5)
nRBC: 0 % (ref 0.0–0.2)

## 2020-07-25 LAB — COMPREHENSIVE METABOLIC PANEL
ALT: 8 U/L (ref 0–44)
AST: 10 U/L — ABNORMAL LOW (ref 15–41)
Albumin: 1.4 g/dL — ABNORMAL LOW (ref 3.5–5.0)
Alkaline Phosphatase: 73 U/L (ref 38–126)
Anion gap: 8 (ref 5–15)
BUN: 8 mg/dL (ref 8–23)
CO2: 17 mmol/L — ABNORMAL LOW (ref 22–32)
Calcium: 8.2 mg/dL — ABNORMAL LOW (ref 8.9–10.3)
Chloride: 110 mmol/L (ref 98–111)
Creatinine, Ser: 0.67 mg/dL (ref 0.44–1.00)
GFR calc Af Amer: >60 mL/min (ref >60)
GFR calc non Af Amer: >60 mL/min (ref >60)
Glucose, Bld: 113 mg/dL — ABNORMAL HIGH (ref 70–99)
Potassium: 4.4 mmol/L (ref 3.5–5.1)
Sodium: 135 mmol/L (ref 135–145)
Total Bilirubin: 0.3 mg/dL (ref 0.3–1.2)
Total Protein: 6.1 g/dL — ABNORMAL LOW (ref 6.5–8.1)

## 2020-07-25 LAB — OCCULT BLOOD X 1 CARD TO LAB, STOOL: Fecal Occult Bld: NEGATIVE

## 2020-07-25 LAB — GLUCOSE, CAPILLARY
Glucose-Capillary: 91 mg/dL (ref 70–99)
Glucose-Capillary: 87 mg/dL (ref 70–99)
Glucose-Capillary: 97 mg/dL (ref 70–99)
Glucose-Capillary: 93 mg/dL (ref 70–99)
Glucose-Capillary: 93 mg/dL (ref 70–99)

## 2020-07-25 LAB — ABO/RH: ABO/RH(D): O POS

## 2020-07-25 LAB — PREPARE RBC (CROSSMATCH)

## 2020-07-25 MED ORDER — SODIUM CHLORIDE 0.9% IV SOLUTION: Freq: Once | INTRAVENOUS | Status: AC

## 2020-07-25 MED ORDER — SODIUM CHLORIDE 0.9 % IV SOLN: INTRAVENOUS | Status: DC

## 2020-07-25 NOTE — Progress Notes (Signed)
    [facility] has been requested to perform a transesophageal echocardiogram on [name] for stroke.  After careful review of history and examination, the risks and benefits of transesophageal echocardiogram have been explained including risks of esophageal damage, perforation (1:10,000 risk), bleeding, pharyngeal hematoma as well as other potential complications associated with conscious sedation including aspiration, arrhythmia, respiratory failure and death. Alternatives to treatment were discussed, questions were answered. Patient is willing to proceed.   She has chronic anemia, although baseline Hb difficult to ascertain. Per primary, no signs of bleeding.  I spoke with the patient and her husband for consent today.   Tami Lin Kody Vigil, [place]  07/25/2020 9:57 AM

## 2020-07-25 NOTE — Progress Notes (Signed)
STROKE TEAM PROGRESS NOTE   HISTORY OF PRESENT ILLNESS (per record) [name] is a 59 y.o. 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  When pressed on specific review of systems questions, she does endorse a cough for the last several days, not wanting to drink her Ensure for the last week or so resulting in increased weight loss, but she has not noticed having a fever.  She denies any other symptoms including headache, difficulty seeing (double vision or loss of vision), difficulty with hearing, nausea, vomiting, diarrhea, incontinence of stool or urine, tongue biting, new focal weakness or numbness.  Significant caveat to this review of systems given her disorientation, though she was able to state that she is here because her husband is concerned about her walking even though she does not have any concerns.  Regarding her stroke history, she was seen by Dr. Leonie Man in 2006. Workup was notable for elevated homocysteine and hypercoagulable panel was sent though results are difficult for me to access at this time.  On chart review her weight loss was noted by her PCP as early as 2008 and was attributed to her stroke (altered taste after her stroke and lack of desire to eat)  LKW: 9/10 evening tPA given?: No, due to out of the window   INTERVAL HISTORY Patient is sitting up in bed.  She has no complaints.   Vital signs at stable.  Neurological exam unchanged.  She has been been cleared for dysphagia 3 diet.  2D echo was unremarkable.  TEE scheduled for tomorrow.  Telemetry monitoring does not show any A. fib.  OBJECTIVE Vitals:   07/25/20 0018 07/25/20 0319 07/25/20 0833  07/25/20 1207  BP: (!) 149/85 139/80 (!) 152/96 (!) 147/93  Pulse:  (!) 103 (!) 108 (!) 110  Resp:  18 20 18   Temp: 98.2 F (36.8 C) 98.1 F (36.7 C) 98.5 F (36.9 C) 98.4 F (36.9 C)  TempSrc: Oral Oral Oral Oral  SpO2:  96% 100% 98%  Weight:    56.5 kg  Height:    5\' 1"  (1.549 m)    CBC:  Recent Labs  Lab 07/23/20 1649 07/23/20 1649 07/24/20 0318 07/25/20 0146  WBC 13.9*   < > 12.4* 15.6*  NEUTROABS 12.1*  --  9.0*  --   HGB 7.9*   < > 7.2* 7.1*  HCT 25.6*   < > 23.7* 23.7*  MCV 79.3*   < > 79.8* 80.6  PLT 519*   < > 406* 497*   < > = values in this interval not displayed.    Basic Metabolic Panel:  Recent Labs  Lab 07/24/20 0318 07/24/20 0709 07/25/20 0146  NA 133*  --  135  K 3.2*  --  4.4  CL 104  --  110  CO2 18*  --  17*  GLUCOSE 106*  --  113*  BUN 7*  --  8  CREATININE 0.57  --  0.67  CALCIUM 8.0*  --  8.2*  MG  --  2.3  --     Lipid Panel:     Component Value Date/Time   CHOL 119 07/23/2020 2026   TRIG 95 07/23/2020 2026   HDL 59 07/23/2020 2026  CHOLHDL 2.0 07/23/2020 2026   VLDL 19 07/23/2020 2026   LDLCALC 41 07/23/2020 2026   HgbA1c:  Lab Results  Component Value Date   HGBA1C 6.1 (H) 07/23/2020   Urine Drug Screen: No results found for: LABOPIA, COCAINSCRNUR, LABBENZ, AMPHETMU, THCU, LABBARB  Alcohol Level     Component Value Date/Time   ETH <10 07/23/2020 1649    IMAGING  CT Head Wo Contrast 07/23/2020 MPRESSION:  3.5 cm acute/early subacute cortical/subcortical infarct within the anterolateral left frontal lobe. Redemonstrated chronic right MCA vascular territory cortically based infarct. Chronic infarct within the posterior right thalamus. Background mild cerebral white matter chronic small vessel ischemic disease.   MR BRAIN WO CONTRAST MR ANGIO HEAD WO CONTRAST MR ANGIO NECK W WO CONTRAST 07/24/2020 IMPRESSION:   MRI HEAD  1. Acute to early subacute anterior left frontal lobe infarct, left MCA distribution,  corresponding with abnormality from prior CT. No associated hemorrhage or mass effect.  2. Multiple additional acute to early subacute ischemic infarcts involving the left frontal, parietal, and occipital lobes, watershed in distribution. Few additional punctate right cerebral cortical infarcts as above. No associated hemorrhage.  3. Underlying chronic right MCA infarct, with additional remote right thalamic infarct.  4. Underlying age-related cerebral atrophy.   MRA HEAD  1. Negative intracranial MRA for large vessel occlusion.  2. Mild diffuse atheromatous irregularity throughout the intracranial circulation. No proximal high-grade or correctable stenosis.   MRA NECK Negative MRA of the neck with wide patency of both carotid artery systems and vertebral arteries. No hemodynamically significant stenosis or other acute vascular abnormality.   DG Chest Port 1 View 07/23/2020 IMPRESSION:   Dense left basilar consolidation, compatible with pneumonia or aspiration. Followup PA and lateral chest X-ray is recommended in 3-4 weeks following trial of antibiotic therapy to ensure resolution and exclude underlying malignancy.   Transthoracic Echocardiogram  07/24/2020 IMPRESSIONS  1. Left ventricular ejection fraction, by estimation, is 60 to 65%. The  left ventricle has normal function. The left ventricle has no regional  wall motion abnormalities. Left ventricular diastolic parameters were  normal.  2. Right ventricular systolic function is normal. The right ventricular  size is normal. There is normal pulmonary artery systolic pressure.  3. The mitral valve is normal in structure. Trivial mitral valve  regurgitation. No evidence of mitral stenosis.  4. The aortic valve was not well visualized. Aortic valve regurgitation  is not visualized. Mild to moderate aortic valve sclerosis/calcification  is present, without any evidence of aortic stenosis.  5. The inferior vena cava is normal in size  with greater than 50%  respiratory variability, suggesting right atrial pressure of 3 mmHg.  6. Agitated saline contrast bubble study was negative, with no evidence  of any interatrial shunt.   ECG - ST rate 123 BPM. (See cardiology reading for complete details)   PHYSICAL EXAM Blood pressure (!) 147/93, pulse (!) 110, temperature 98.4 F (36.9 C), temperature source Oral, resp. rate 18, height 5\' 1"  (1.549 m), weight 56.5 kg, SpO2 98 %. Frail malnourished looking middle-aged African-American lady not in distress. . Afebrile. Head is nontraumatic. Neck is supple without bruit.    Cardiac exam no murmur or gallop. Lungs are clear to auscultation. Distal pulses are well felt. Neurological Exam :  She is awake alert disoriented to time place and person.  Diminished attention, registration and recall.  Poor insight into her condition.  Speech is clear without dysarthria or aphasia.  She can name and repeat well.  She has some right gaze preference but able to look to the left past midline.  Left dense homonymous hemianopsia.  Fundi not visualized.  Mild right lower facial asymmetry when she smiles.  Tongue midline.  Motor system exam able to move all 4 extremities against gravity upper extremities more than lower extremities but cooperation is variable and poor with effort inconsistent.  No apparent focal weakness.  Sensation appears intact bilaterally.  Deep tendon reflexes are symmetric.  Plantars downgoing.  Gait not tested.  ASSESSMENT/PLAN Ms. [name] is a 59 y.o. 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 Leonie Man in 2006 workup was notable for elevated homocysteine and hypercoagulable panel). hypertension, hx of abdominal mass 2006 felt to be benign, smoking, complicated by the development of anorexia, presenting with worsening weakness, difficulty walking, AMS, fever felt to be secondary to pneumonia, and anemia. She did not  receive IV t-PA due to late presentation (>4.5 hours from time of onset).  Stroke: Acute to early subacute anterior left frontal lobe infarct, left MCA distribution -  A few additional punctate right cerebral cortical infarcts - embolic - source unknown.  Resultant altered mental status and encephalopathy.  Old left homonymous hemianopsia.  Code Stroke CT Head - not ordered   CT head - 3.5 cm acute/early subacute cortical/subcortical infarct within the anterolateral left frontal lobe. Redemonstrated chronic right MCA vascular territory cortically based infarct. Chronic infarct within the posterior right thalamus. Background mild cerebral white matter chronic small vessel ischemic disease.   MRI head - Acute to early subacute anterior left frontal lobe infarct, left MCA distribution, corresponding with abnormality from prior CT. No associated hemorrhage or mass effect. Multiple additional acute to early subacute ischemic infarcts involving the left frontal, parietal, and occipital lobes, watershed in distribution. Few additional punctate right cerebral cortical infarcts as above. No associated hemorrhage. Underlying chronic right MCA infarct, with additional remote right thalamic infarct. Underlying age-related cerebral atrophy.   MRA head - Negative intracranial MRA for large vessel occlusion. Mild diffuse atheromatous irregularity throughout the intracranial circulation. No proximal high-grade or correctable stenosis.  MRA neck - Negative MRA of the neck with wide patency of both carotid artery systems and vertebral arteries. No hemodynamically significant stenosis or other acute vascular abnormality.   CTA H&N - not ordered  CT Perfusion - not ordered  Carotid Doppler - MRA neck ordered - carotid dopplers not indicated  Anticardiolipin antibodies - pending  ANA panel - pending  2D Echo - EF 60 - 65%. No cardiac source of emboli identified. No evidence of any interatrial shunt.   Transcranial Doppler bubble study - pending Bilateral Lower Extremity Venous Dopplers - pending TEE - pending  [employer] Virus 2 - negative  LDL - 41  HgbA1c - 6.1  UDS - pending  VTE prophylaxis - Lovenox Diet  Diet Order            DIET DYS 3 Room service appropriate? No; Fluid consistency: Thin  Diet effective now                 dipyridamole SR 250 mg/aspirin 25 mg twice a day prior to admission, now on aspirin 325 mg daily  Patient will be counseled to be compliant with her antithrombotic medications  Ongoing aggressive stroke risk factor management  Therapy recommendations:  Outpatient OT and PT recommended  Disposition:  Pending  Hypertension  Home BP meds: Lisinopril  Current BP meds: none  Stable . Permissive hypertension (OK if < 220/120) but gradually normalize in 5-7 days  . Long-term BP goal normotensive  Hyperlipidemia  Home Lipid lowering medication: none   LDL 41, goal < 70  Current lipid lowering medication: Lipitor 80 mg daily   Continue statin at discharge  Pre Diabetes ?  Home diabetic meds: none   Current diabetic meds: SSI   HgbA1c 6.1, goal < 7.0 Recent Labs    07/25/20 0616 07/25/20 0713 07/25/20 1110  GLUCAP 91 87 97    Other Stroke Risk Factors  Advanced age  Former cigarette smoker - quit  ETOH use, advised to drink no more than 1 alcoholic beverage per day.  Obesity, Body mass index is 23.54 kg/m., recommend weight loss, diet and exercise as appropriate   Hx stroke/TIA  Other Active Problems  Code status - Full Code  Malnutrition - Ensure PTA   Suspected pneumonia - Zithromax and Rocephin started 07/23/20. Lactic acid 1.5 (WNL)  CXR - Dense left basilar consolidation, compatible with pneumonia or aspiration. Followup PA and lateral chest X-ray is recommended in 3-4 weeks following trial of antibiotic therapy to ensure resolution and exclude underlying malignancy.   Tachycardia - likely related  to fever and anemia  Anemia - Hgb - 7.2->7.1 (stool for occult blood - negative x 2)  Leukocytosis - WBC's - 13.9->12.4->15.6  ( temp - 98.4 ) Zithromax and Rocephin started 07/23/20 for CAP  Urine and blood cultures - pending  Hypokalemia - potassium - 3.5->3.2->4.4  Hyponatremia - Na - 132->133->135  Hospital day # 2  She has presented with altered mental status with MRI scan showing multiple by cerebral infarcts with largest 1 being the left frontal region etiology likely from central cardiac source.  She has remote history of large right MCA infarct in 2006 which was presumably of cryptogenic etiology and then she was lost to follow-up.  Recommend continue ongoing stroke work-up.  Check urine drug screen and Transesophageal echocardiogram.  She will likely need   loop recorde ar duscharge.  Transcranial Doppler bubble study for PFO and lower extremity venous Dopplers for DVT.  Check anticardiolipin antibodies and ANA panel. Continue aspirin and Plavix for now no family available at bedside for discussion.  Discussed with Dr. Avon Gully.  Greater than 50% time during this 25-minute visit was spent on counseling and coordination of care about her multiple embolic strokes and discussion about evaluation treatment plan and answering questions  Antony Contras, MD  To contact Stroke Continuity provider, please refer to [email or web address] After hours, contact General Neurology

## 2020-07-25 NOTE — Progress Notes (Signed)
PROGRESS NOTE    [name]  RFF:[id number] DOB: 10-12-1961 DOA: 07/23/2020 PCP: London Pepper, MD   Brief Narrative:  [name] is a 59 y.o. female with medical history significant for prior CVA with mild right-sided deficit not on antiplatelets for the past year, essential hypertension, GERD, microcytic anemia who presented to [facility] ED from her [place] office due to confusion and worsened right-sided weakness.  Last known well last night prior to bed.  Her husband reports new cough since last night and significant weight loss for the last several months.  Poor appetite and poor oral intake.  Denies use of NSAIDs, abdominal pain, diarrhea or constipation.  She was sent to the ED for further evaluation and management of her right sided weaness. In ED: patient noted to be febrile with T-max 102.9, heart rate 121, respiration rate of 29.  Chest x-ray showing left basilar infiltrates with suspicion for pneumonia and aspiration.  Lab studies remarkable for WBC 13.9K, hemoglobin 7.9K and MCV of 79.  Neutrophilia 12.1.  TRH was asked to admit.  Assessment & Plan:   Active Problems:   Essential hypertension   Acute CVA (cerebrovascular accident) ([place])   Acute metabolic encephalopathy   Iron deficiency anemia due to chronic blood loss   Severe protein-calorie malnutrition [name]: less than 60% of standard weight) (HCC)   Acute on recurrent multifocal strokes, most prominently noted in left frontal lobe, unclear etiology -concern for cardioembolic origin, POA  - Seen on CT head, also showing chronic infarct within the posterior right thalamus. - MRI brain, MRA head and neck -multiple acute to early subacute ischemic infarcts of various lobes including frontal parietal and occipital. -Neurology following, appreciate insight and recommendations -TTE unremarkable, no noticeable PFO - Pending transcranial bubble study, DVT study per neurology, pending TEE with cardiology -Undergoing hypercoagulable  workup in outpatient setting per neuro documentation - anticardiolipin Ab and ANA panel. -Continue ASA/plavixx and Lipitor 80 mg daily -Lipid panel and A1c within normal limits  -PT/OT/speech therapy evaluation following per protocol  Sepsis secondary to left lower lobe community-acquired pneumonia with possible aspiration, POA -Presented with fever T-max of 102.9, tachycardia 121, tachypnea 29 and left lower lobe infiltrates on chest x-ray suggestive of pneumonia -Continue empiric coverage with Rocephin and azithromycin x5 days -Continue to follow cultures, negative thus far  Acute metabolic encephalopathy in the setting of acute CVA and sepsis secondary to pneumonia, POA, resolving -UA uncollected -Continue antibiotics as above -Patient's mental status is improving drastically over the past 48 hours per husband at bedside   Chronic microcytic anemia, in the setting of protein caloric malnutrition and severe iron deficiency - Hemoglobin slowly downtrending, will transfuse 1 unit PRBC given cardiology request prior to TEE - No overt bleeding, continue to follow clinically -FOBT negative -Continue to advance diet, supplement iron as tolerated with diet and ferrous sulfate    Component Value Date/Time   IRON <5 (L) 07/23/2020 2030   TIBC 150 (L) 07/23/2020 2030   FERRITIN 117 07/23/2020 2030   IRONPCTSAT NOT CALCULATED 07/23/2020 2030   Severe protein caloric malnutrition -TSH within normal limits -Without clear etiology, continue to advance diet as tolerated  Hyperglycemia, without diabetes diagnosis - A1C 6.1 as above - Continue sliding scale insulin  History of CVA, see above - Per the patient and her husband at bedside, she stopped taking home Aggrenox for 1 year. - Not on statin at home, no neurology outpatient follow up  Hypokalemia, mild  -Previously repleted, follow morning labs  Essential hypertension -Permissive hypertension window closed, continue close  monitoring -Resume antihypertensive medication as indicated  Hyperlipidemia -Lipid panel essentially within normal limits -Continue home atorvastatin 80 mg daily  Ambulatory dysfunction secondary to CVA as above PT OT to assess Fall precautions   DVT prophylaxis: Lovenox Code Status: Full Family Communication: Husband at bedside  Status is: Inpatient  Dispo: The patient is from: Home              Anticipated d/c is to: Home              Anticipated d/c date is: 24 to 48 hours pending clinical course              Patient currently not medically stable for discharge due to ongoing mental status changes from baseline, poor p.o. intake, need for close monitoring in the setting of acute infection requiring IV antibiotics  Consultants:   Neurology  Procedures:   None  Antimicrobials:  Azithromycin, ceftriaxone  Subjective: No acute issues or events overnight, patient appears to be back to baseline as far as mental status goes per patient and husband at bedside.  She otherwise denies nausea, vomiting, diarrhea, constipation, headache, fevers, chills.  Lengthy discussion today at bedside about ongoing evaluation and testing to rule out etiology of recurrent strokes.  Objective: Vitals:   07/24/20 1619 07/24/20 1942 07/25/20 0018 07/25/20 0319  BP: 123/75 137/87 (!) 149/85 139/80  Pulse: (!) 105 (!) 103  (!) 103  Resp: 20 18  18   Temp: 10.0 F (71.2 C) 98.2 F (36.8 C) 98.2 F (36.8 C) 98.1 F (36.7 C)  TempSrc: Oral Oral Oral Oral  SpO2: 99% 96%  96%    Intake/Output Summary (Last 24 hours) at 07/25/2020 0704 Last data filed at 07/25/2020 0400 Gross per 24 hour  Intake 3767.94 ml  Output --  Net 3767.94 ml   There were no vitals filed for this visit.  Examination:  General:  Pleasantly resting in bed, No acute distress.  Cachectic appearing HEENT:  Normocephalic atraumatic.  Sclerae nonicteric, noninjected.  Extraocular movements intact bilaterally. Neck:   Without mass or deformity.  Trachea is midline. Lungs:  Clear to auscultate bilaterally without rhonchi, wheeze, or rales. Heart:  Regular rate and rhythm.  Without murmurs, rubs, or gallops. Abdomen:  Soft, nontender, nondistended.  Without guarding or rebound. Extremities: Without cyanosis, clubbing, edema, or obvious deformity. Vascular:  Dorsalis pedis and posterior tibial pulses palpable bilaterally. Skin:  Warm and dry, no erythema, no ulcerations.   Data Reviewed: I have personally reviewed following labs and imaging studies  CBC: Recent Labs  Lab 07/23/20 1649 07/24/20 0318 07/25/20 0146  WBC 13.9* 12.4* 15.6*  NEUTROABS 12.1* 9.0*  --   HGB 7.9* 7.2* 7.1*  HCT 25.6* 23.7* 23.7*  MCV 79.3* 79.8* 80.6  PLT 519* 406* 197*   Basic Metabolic Panel: Recent Labs  Lab 07/23/20 1649 07/24/20 0318 07/24/20 0709 07/25/20 0146  NA 132* 133*  --  135  K 3.5 3.2*  --  4.4  CL 99 104  --  110  CO2 22 18*  --  17*  GLUCOSE 154* 106*  --  113*  BUN 7* 7*  --  8  CREATININE 0.76 0.57  --  0.67  CALCIUM 8.8* 8.0*  --  8.2*  MG  --   --  2.3  --    GFR: CrCl cannot be calculated (Unknown ideal weight.). Liver Function Tests: Recent Labs  Lab 07/23/20 1649 07/24/20  6433 07/25/20 0146  AST 9* 12* 10*  ALT 6 7 8   ALKPHOS 87 70 73  BILITOT 0.5 0.4 0.3  PROT 7.7 6.3* 6.1*  ALBUMIN 1.9* 1.5* 1.4*   No results for input(s): LIPASE, AMYLASE in the last 168 hours. No results for input(s): AMMONIA in the last 168 hours. Coagulation Profile: Recent Labs  Lab 07/23/20 1649  INR 1.2   Cardiac Enzymes: No results for input(s): CKTOTAL, CKMB, CKMBINDEX, TROPONINI in the last 168 hours. BNP (last 3 results) No results for input(s): PROBNP in the last 8760 hours. HbA1C: Recent Labs    07/23/20 2026  HGBA1C 6.1*   CBG: Recent Labs  Lab 07/24/20 0725 07/24/20 1047 07/24/20 1712 07/24/20 2126 07/25/20 0616  GLUCAP 99 120* 113* 107* 91   Lipid Profile: Recent Labs     07/23/20 2026  CHOL 119  HDL 59  LDLCALC 41  TRIG 95  CHOLHDL 2.0   Thyroid Function Tests: Recent Labs    07/23/20 2200  TSH 0.613   Anemia Panel: Recent Labs    07/23/20 2030 07/23/20 2100  VITAMINB12 275  --   FOLATE  --  9.6  FERRITIN 117  --   TIBC 150*  --   IRON <5*  --    Sepsis Labs: Recent Labs  Lab 07/23/20 1808  LATICACIDVEN 1.5    Recent Results (from the past 240 hour(s))  SARS Coronavirus 2 by RT PCR (hospital order, performed in [place] hospital lab) Nasopharyngeal Nasopharyngeal Swab     Status: None   Collection Time: 07/23/20  5:39 PM   Specimen: Nasopharyngeal Swab  Result Value Ref Range Status   SARS Coronavirus 2 NEGATIVE NEGATIVE Final    Comment: (NOTE) SARS-CoV-2 target nucleic acids are NOT DETECTED.  The SARS-CoV-2 RNA is generally detectable in upper and lower respiratory specimens during the acute phase of infection. The lowest concentration of SARS-CoV-2 viral copies this assay can detect is 250 copies / mL. A negative result does not preclude SARS-CoV-2 infection and should not be used as the sole basis for treatment or other patient management decisions.  A negative result may occur with improper specimen collection / handling, submission of specimen other than nasopharyngeal swab, presence of viral mutation(s) within the areas targeted by this assay, and inadequate number of viral copies (<250 copies / mL). A negative result must be combined with clinical observations, patient history, and epidemiological information.  Fact Sheet for Patients:   StrictlyIdeas.no  Fact Sheet for Healthcare Providers: BankingDealers.co.za  This test is not yet approved or  cleared by the [place] FDA and has been authorized for detection and/or diagnosis of SARS-CoV-2 by FDA under an Emergency Use Authorization (EUA).  This EUA will remain in effect (meaning this test can be used)  for the duration of the COVID-19 declaration under Section 564(b)(1) of the Act, 21 U.S.C. section 360bbb-3(b)(1), unless the authorization is terminated or revoked sooner.  Performed at [facility], [facility] [address], [place], [place] [address]   Blood culture (routine single)     Status: None (Preliminary result)   Collection Time: 07/23/20  6:09 PM   Specimen: BLOOD  Result Value Ref Range Status   Specimen Description BLOOD SITE NOT SPECIFIED  Final   Special Requests   Final    BOTTLES DRAWN AEROBIC AND ANAEROBIC Blood Culture adequate volume   Culture   Final    NO GROWTH < 24 HOURS Performed at [facility], [address] [place], [place] [address]    Report Status PENDING  Incomplete  Culture, blood (single)     Status: None (Preliminary result)   Collection Time: 07/23/20  6:09 PM   Specimen: BLOOD  Result Value Ref Range Status   Specimen Description BLOOD SITE NOT SPECIFIED  Final   Special Requests   Final    BOTTLES DRAWN AEROBIC ONLY Blood Culture results may not be optimal due to an inadequate volume of blood received in culture bottles   Culture   Final    NO GROWTH < 24 HOURS Performed at [facility], [address], [place], [place] [address]    Report Status PENDING  Incomplete         Radiology Studies: CT Head Wo Contrast  Result Date: 07/23/2020 CLINICAL DATA:  Neuro deficit, acute, stroke suspected. Additional history provided: Sudden onset unsteady gait, stroke 2 years ago affecting right side EXAM: CT HEAD WITHOUT CONTRAST TECHNIQUE: Contiguous axial images were obtained from the base of the skull through the vertex without intravenous contrast. COMPARISON:  Noncontrast head CT 01/21/2005 FINDINGS: Brain: There is a 3.5 cm focus of cortical/subcortical hypodensity within the anterolateral left frontal lobe consistent with acute/early subacute infarction (for instance as seen on series 3, image 15). There is no significant mass  effect at this time. Redemonstrated chronic cortically based infarct predominantly affecting portions of the right temporal and parietal lobes as well as posterior right insula. Chronic infarction changes also affect the posterior right thalamus. Background mild ill-defined hypoattenuation within the cerebral white matter is nonspecific, but consistent with chronic small vessel ischemic disease. There is no acute intracranial hemorrhage. No extra-axial fluid collection. No evidence of intracranial mass. Vascular:No hyperdense vessel.  Atherosclerotic calcifications. Skull: Normal. Negative for fracture or focal lesion. Sinuses/Orbits: Visualized orbits show no acute finding. Minimal scattered paranasal sinus mucosal thickening. No significant mastoid effusion. These results were called by telephone at the time of interpretation on 07/23/2020 at 6:48 pm to provider [facility] , who verbally acknowledged these results. IMPRESSION: 3.5 cm acute/early subacute cortical/subcortical infarct within the anterolateral left frontal lobe. Redemonstrated chronic right MCA vascular territory cortically based infarct. Chronic infarct within the posterior right thalamus. Background mild cerebral white matter chronic small vessel ischemic disease. Electronically Signed   By: Kellie Simmering DO   On: 07/23/2020 18:50   MR ANGIO HEAD WO CONTRAST  Result Date: 07/24/2020 CLINICAL DATA:  Follow-up examination for acute stroke. EXAM: MRI HEAD WITHOUT CONTRAST MRA HEAD WITHOUT CONTRAST MRA NECK WITHOUT AND WITH CONTRAST TECHNIQUE: Multiplanar, multiecho pulse sequences of the brain and surrounding structures were obtained without intravenous contrast. Angiographic images of the Circle of Willis were obtained using MRA technique without intravenous contrast. Angiographic images of the neck were obtained using MRA technique without and with intravenous contrast. Carotid stenosis measurements (when applicable) are obtained utilizing  NASCET criteria, using the distal internal carotid diameter as the denominator. CONTRAST:  59mL GADAVIST GADOBUTROL 1 MMOL/ML IV SOLN COMPARISON:  Prior CT from 07/23/2020. FINDINGS: MRI HEAD FINDINGS Brain: Examination mildly degraded by motion artifact. Diffuse prominence of the CSF containing spaces compatible generalized age-related cerebral atrophy. 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 Associated mild chronic hemosiderin staining within this region. Confluent wedge-shaped area of restricted diffusion measuring up to 3.5 cm in diameter seen involving the anterior left frontal lobe, corresponding with abnormality on prior CT. Finding consistent with and ischemic  infarct, acute to early subacute in appearance. Multiple additional scattered acute to early subacute ischemic infarcts seen involving the cortical and subcortical left frontal, parietal, and occipital lobes, somewhat watershed in distribution. Few additional punctate acute to early subacute ischemic cortical infarct noted involving the right frontal lobe (series 5, images 86, 85). No associated hemorrhage or mass effect about these infarcts. Gray-white matter differentiation otherwise maintained. No acute intracranial hemorrhage. No mass lesion or midline shift. Mild ex vacuo dilatation of the right lateral ventricle related to the chronic right MCA territory infarct. No hydrocephalus. No extra-axial fluid collection. Pituitary gland suprasellar region within normal limits. Vascular: Major intracranial vascular flow voids are maintained. Skull and upper cervical spine: Craniocervical junction within normal limits. Bone marrow signal intensity normal. No scalp soft tissue abnormality. Sinuses/Orbits: Globes and orbital soft tissues within normal limits. Mild scattered mucosal thickening noted within the ethmoidal air cells and maxillary sinuses. Paranasal  sinuses are otherwise clear. No mastoid effusion. Inner ear structures grossly normal. Other: None. MRA HEAD FINDINGS ANTERIOR CIRCULATION: Examination mildly degraded by motion. Visualized distal cervical segments of the internal carotid arteries are patent with antegrade flow. Scattered atheromatous irregularity throughout the carotid siphons without flow-limiting stenosis. ICA termini well perfused. A1 segments patent bilaterally. Normal anterior communicating artery complex. Anterior cerebral arteries patent distally without significant stenosis. No M1 stenosis or occlusion. Negative MCA bifurcations. No proximal MCA branch occlusion. Scattered atheromatous irregularity throughout the distal MCA branches bilaterally. Right MCA branches somewhat attenuated as compared to the left, in keeping with the chronic right MCA territory infarct. POSTERIOR CIRCULATION: Vertebral arteries patent to the vertebrobasilar junction without stenosis. Right vertebral artery slightly dominant. Both picas patent proximally. Basilar mildly irregular but widely patent to its distal aspect without stenosis. Superior cerebral arteries patent bilaterally. 2 mm somewhat funnel shaped outpouching at the origin of the left SCA favored to reflect a vascular infundibulum. Both PCA supplied via the basilar or as well as prominent bilateral posterior communicating arteries. PCAs mildly irregular without proximal high-grade P1 and P2 stenosis. Focal severe distal right P3 stenosis noted (series 1224, image 5). No intracranial aneurysm. MRA NECK FINDINGS AORTIC ARCH: Visualized aortic arch of normal caliber. Bovine branching pattern with common origin of the right brachiocephalic and left common carotid arteries noted. No flow-limiting stenosis about the origin of the great vessels. RIGHT CAROTID SYSTEM: Right common carotid artery widely patent from its origin to the bifurcation. No significant atheromatous irregularity or narrowing about the  right bifurcation. Right ICA widely patent distally without stenosis, dissection or occlusion. LEFT CAROTID SYSTEM: Left common carotid artery patent from its origin to the bifurcation without stenosis. Minimal atheromatous irregularity about the left bifurcation without significant stenosis. Left ICA widely patent distally without stenosis, dissection, or occlusion. VERTEBRAL ARTERIES: Both vertebral arteries arise from the subclavian arteries. No proximal subclavian artery stenosis. Vertebral arteries widely patent within the neck without stenosis, dissection or occlusion. IMPRESSION: MRI HEAD IMPRESSION: 1. Acute to early subacute anterior left frontal lobe infarct, left MCA distribution, corresponding with abnormality from prior CT. No associated hemorrhage or mass effect. 2. Multiple additional acute to early subacute ischemic infarcts involving the left frontal, parietal, and occipital lobes, watershed in distribution. Few additional punctate right cerebral cortical infarcts as above. No associated hemorrhage. 3. Underlying chronic right MCA infarct, with additional remote right thalamic infarct. 4. Underlying age-related cerebral atrophy. MRA HEAD IMPRESSION: 1. Negative intracranial MRA for large vessel occlusion. 2. Mild diffuse atheromatous irregularity throughout the intracranial circulation. No proximal  high-grade or correctable stenosis. MRA NECK IMPRESSION: Negative MRA of the neck with wide patency of both carotid artery systems and vertebral arteries. No hemodynamically significant stenosis or other acute vascular abnormality. Electronically Signed   By: Jeannine Boga M.D.   On: 07/24/2020 02:40   MR ANGIO NECK W WO CONTRAST  Result Date: 07/24/2020 CLINICAL DATA:  Follow-up examination for acute stroke. EXAM: MRI HEAD WITHOUT CONTRAST MRA HEAD WITHOUT CONTRAST MRA NECK WITHOUT AND WITH CONTRAST TECHNIQUE: Multiplanar, multiecho pulse sequences of the brain and surrounding structures were  obtained without intravenous contrast. Angiographic images of the Circle of Willis were obtained using MRA technique without intravenous contrast. Angiographic images of the neck were obtained using MRA technique without and with intravenous contrast. Carotid stenosis measurements (when applicable) are obtained utilizing NASCET criteria, using the distal internal carotid diameter as the denominator. CONTRAST:  12mL GADAVIST GADOBUTROL 1 MMOL/ML IV SOLN COMPARISON:  Prior CT from 07/23/2020. FINDINGS: MRI HEAD FINDINGS Brain: Examination mildly degraded by motion artifact. Diffuse prominence of the CSF containing spaces compatible generalized age-related cerebral atrophy. 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 Associated mild chronic hemosiderin staining within this region. Confluent wedge-shaped area of restricted diffusion measuring up to 3.5 cm in diameter seen involving the anterior left frontal lobe, corresponding with abnormality on prior CT. Finding consistent with and ischemic infarct, acute to early subacute in appearance. Multiple additional scattered acute to early subacute ischemic infarcts seen involving the cortical and subcortical left frontal, parietal, and occipital lobes, somewhat watershed in distribution. Few additional punctate acute to early subacute ischemic cortical infarct noted involving the right frontal lobe (series 5, images 86, 85). No associated hemorrhage or mass effect about these infarcts. Gray-white matter differentiation otherwise maintained. No acute intracranial hemorrhage. No mass lesion or midline shift. Mild ex vacuo dilatation of the right lateral ventricle related to the chronic right MCA territory infarct. No hydrocephalus. No extra-axial fluid collection. Pituitary gland suprasellar region within normal limits. Vascular: Major intracranial vascular flow voids are maintained.  Skull and upper cervical spine: Craniocervical junction within normal limits. Bone marrow signal intensity normal. No scalp soft tissue abnormality. Sinuses/Orbits: Globes and orbital soft tissues within normal limits. Mild scattered mucosal thickening noted within the ethmoidal air cells and maxillary sinuses. Paranasal sinuses are otherwise clear. No mastoid effusion. Inner ear structures grossly normal. Other: None. MRA HEAD FINDINGS ANTERIOR CIRCULATION: Examination mildly degraded by motion. Visualized distal cervical segments of the internal carotid arteries are patent with antegrade flow. Scattered atheromatous irregularity throughout the carotid siphons without flow-limiting stenosis. ICA termini well perfused. A1 segments patent bilaterally. Normal anterior communicating artery complex. Anterior cerebral arteries patent distally without significant stenosis. No M1 stenosis or occlusion. Negative MCA bifurcations. No proximal MCA branch occlusion. Scattered atheromatous irregularity throughout the distal MCA branches bilaterally. Right MCA branches somewhat attenuated as compared to the left, in keeping with the chronic right MCA territory infarct. POSTERIOR CIRCULATION: Vertebral arteries patent to the vertebrobasilar junction without stenosis. Right vertebral artery slightly dominant. Both picas patent proximally. Basilar mildly irregular but widely patent to its distal aspect without stenosis. Superior cerebral arteries patent bilaterally. 2 mm somewhat funnel shaped outpouching at the origin of the left SCA favored to reflect a vascular infundibulum. Both PCA supplied via the basilar or as well as prominent bilateral posterior communicating arteries. PCAs mildly irregular without proximal high-grade P1 and P2 stenosis. Focal severe distal right P3 stenosis noted (series  1224, image 5). No intracranial aneurysm. MRA NECK FINDINGS AORTIC ARCH: Visualized aortic arch of normal caliber. Bovine branching  pattern with common origin of the right brachiocephalic and left common carotid arteries noted. No flow-limiting stenosis about the origin of the great vessels. RIGHT CAROTID SYSTEM: Right common carotid artery widely patent from its origin to the bifurcation. No significant atheromatous irregularity or narrowing about the right bifurcation. Right ICA widely patent distally without stenosis, dissection or occlusion. LEFT CAROTID SYSTEM: Left common carotid artery patent from its origin to the bifurcation without stenosis. Minimal atheromatous irregularity about the left bifurcation without significant stenosis. Left ICA widely patent distally without stenosis, dissection, or occlusion. VERTEBRAL ARTERIES: Both vertebral arteries arise from the subclavian arteries. No proximal subclavian artery stenosis. Vertebral arteries widely patent within the neck without stenosis, dissection or occlusion. IMPRESSION: MRI HEAD IMPRESSION: 1. Acute to early subacute anterior left frontal lobe infarct, left MCA distribution, corresponding with abnormality from prior CT. No associated hemorrhage or mass effect. 2. Multiple additional acute to early subacute ischemic infarcts involving the left frontal, parietal, and occipital lobes, watershed in distribution. Few additional punctate right cerebral cortical infarcts as above. No associated hemorrhage. 3. Underlying chronic right MCA infarct, with additional remote right thalamic infarct. 4. Underlying age-related cerebral atrophy. MRA HEAD IMPRESSION: 1. Negative intracranial MRA for large vessel occlusion. 2. Mild diffuse atheromatous irregularity throughout the intracranial circulation. No proximal high-grade or correctable stenosis. MRA NECK IMPRESSION: Negative MRA of the neck with wide patency of both carotid artery systems and vertebral arteries. No hemodynamically significant stenosis or other acute vascular abnormality. Electronically Signed   By: Jeannine Boga M.D.    On: 07/24/2020 02:40   MR BRAIN WO CONTRAST  Result Date: 07/24/2020 CLINICAL DATA:  Follow-up examination for acute stroke. EXAM: MRI HEAD WITHOUT CONTRAST MRA HEAD WITHOUT CONTRAST MRA NECK WITHOUT AND WITH CONTRAST TECHNIQUE: Multiplanar, multiecho pulse sequences of the brain and surrounding structures were obtained without intravenous contrast. Angiographic images of the Circle of Willis were obtained using MRA technique without intravenous contrast. Angiographic images of the neck were obtained using MRA technique without and with intravenous contrast. Carotid stenosis measurements (when applicable) are obtained utilizing NASCET criteria, using the distal internal carotid diameter as the denominator. CONTRAST:  67mL GADAVIST GADOBUTROL 1 MMOL/ML IV SOLN COMPARISON:  Prior CT from 07/23/2020. FINDINGS: MRI HEAD FINDINGS Brain: Examination mildly degraded by motion artifact. Diffuse prominence of the CSF containing spaces compatible generalized age-related cerebral atrophy. 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 Associated mild chronic hemosiderin staining within this region. Confluent wedge-shaped area of restricted diffusion measuring up to 3.5 cm in diameter seen involving the anterior left frontal lobe, corresponding with abnormality on prior CT. Finding consistent with and ischemic infarct, acute to early subacute in appearance. Multiple additional scattered acute to early subacute ischemic infarcts seen involving the cortical and subcortical left frontal, parietal, and occipital lobes, somewhat watershed in distribution. Few additional punctate acute to early subacute ischemic cortical infarct noted involving the right frontal lobe (series 5, images 86, 85). No associated hemorrhage or mass effect about these infarcts. Gray-white matter differentiation otherwise maintained. No acute intracranial hemorrhage.  No mass lesion or midline shift. Mild ex vacuo dilatation of the right lateral ventricle related to the chronic right MCA territory infarct. No hydrocephalus. No extra-axial fluid collection. Pituitary gland suprasellar region within normal limits. Vascular: Major intracranial vascular flow voids are maintained. Skull and  upper cervical spine: Craniocervical junction within normal limits. Bone marrow signal intensity normal. No scalp soft tissue abnormality. Sinuses/Orbits: Globes and orbital soft tissues within normal limits. Mild scattered mucosal thickening noted within the ethmoidal air cells and maxillary sinuses. Paranasal sinuses are otherwise clear. No mastoid effusion. Inner ear structures grossly normal. Other: None. MRA HEAD FINDINGS ANTERIOR CIRCULATION: Examination mildly degraded by motion. Visualized distal cervical segments of the internal carotid arteries are patent with antegrade flow. Scattered atheromatous irregularity throughout the carotid siphons without flow-limiting stenosis. ICA termini well perfused. A1 segments patent bilaterally. Normal anterior communicating artery complex. Anterior cerebral arteries patent distally without significant stenosis. No M1 stenosis or occlusion. Negative MCA bifurcations. No proximal MCA branch occlusion. Scattered atheromatous irregularity throughout the distal MCA branches bilaterally. Right MCA branches somewhat attenuated as compared to the left, in keeping with the chronic right MCA territory infarct. POSTERIOR CIRCULATION: Vertebral arteries patent to the vertebrobasilar junction without stenosis. Right vertebral artery slightly dominant. Both picas patent proximally. Basilar mildly irregular but widely patent to its distal aspect without stenosis. Superior cerebral arteries patent bilaterally. 2 mm somewhat funnel shaped outpouching at the origin of the left SCA favored to reflect a vascular infundibulum. Both PCA supplied via the basilar or as well  as prominent bilateral posterior communicating arteries. PCAs mildly irregular without proximal high-grade P1 and P2 stenosis. Focal severe distal right P3 stenosis noted (series 1224, image 5). No intracranial aneurysm. MRA NECK FINDINGS AORTIC ARCH: Visualized aortic arch of normal caliber. Bovine branching pattern with common origin of the right brachiocephalic and left common carotid arteries noted. No flow-limiting stenosis about the origin of the great vessels. RIGHT CAROTID SYSTEM: Right common carotid artery widely patent from its origin to the bifurcation. No significant atheromatous irregularity or narrowing about the right bifurcation. Right ICA widely patent distally without stenosis, dissection or occlusion. LEFT CAROTID SYSTEM: Left common carotid artery patent from its origin to the bifurcation without stenosis. Minimal atheromatous irregularity about the left bifurcation without significant stenosis. Left ICA widely patent distally without stenosis, dissection, or occlusion. VERTEBRAL ARTERIES: Both vertebral arteries arise from the subclavian arteries. No proximal subclavian artery stenosis. Vertebral arteries widely patent within the neck without stenosis, dissection or occlusion. IMPRESSION: MRI HEAD IMPRESSION: 1. Acute to early subacute anterior left frontal lobe infarct, left MCA distribution, corresponding with abnormality from prior CT. No associated hemorrhage or mass effect. 2. Multiple additional acute to early subacute ischemic infarcts involving the left frontal, parietal, and occipital lobes, watershed in distribution. Few additional punctate right cerebral cortical infarcts as above. No associated hemorrhage. 3. Underlying chronic right MCA infarct, with additional remote right thalamic infarct. 4. Underlying age-related cerebral atrophy. MRA HEAD IMPRESSION: 1. Negative intracranial MRA for large vessel occlusion. 2. Mild diffuse atheromatous irregularity throughout the intracranial  circulation. No proximal high-grade or correctable stenosis. MRA NECK IMPRESSION: Negative MRA of the neck with wide patency of both carotid artery systems and vertebral arteries. No hemodynamically significant stenosis or other acute vascular abnormality. Electronically Signed   By: Jeannine Boga M.D.   On: 07/24/2020 02:40   DG Chest Port 1 View  Result Date: 07/23/2020 CLINICAL DATA:  Sepsis EXAM: PORTABLE CHEST 1 VIEW COMPARISON:  01/19/2005 FINDINGS: Single frontal view of the chest demonstrates dense consolidation at the left lung base compatible with pneumonia or aspiration. Diffuse interstitial prominence elsewhere within the lungs likely reflects scarring. No effusion or pneumothorax. There is elevation of the left hemidiaphragm. The cardiac silhouette is unremarkable. IMPRESSION: 1.  Dense left basilar consolidation, compatible with pneumonia or aspiration. Followup PA and lateral chest X-ray is recommended in 3-4 weeks following trial of antibiotic therapy to ensure resolution and exclude underlying malignancy. Electronically Signed   By: Randa Ngo M.D.   On: 07/23/2020 16:55   ECHOCARDIOGRAM COMPLETE BUBBLE STUDY  Result Date: 07/24/2020    ECHOCARDIOGRAM REPORT   Patient Name:   CHANTE MAYSON Date of Exam: 07/24/2020 Medical Rec #:  [id number]     Height:       62.3 in Accession #:    [id number]    Weight:       132.0 lb Date of Birth:  18-Jul-1961     BSA:          1.607 m Patient Age:    56 years      BP:           108/67 mmHg Patient Gender: F             HR:           104 bpm. Exam Location:  Inpatient Procedure: 2D Echo and Saline Contrast Bubble Study Indications:    stroke 434.91  History:        Patient has prior history of Echocardiogram examinations, most                 recent 01/18/2005.  Sonographer:    Johny Chess Referring Phys: [id number] [place]  1. Left ventricular ejection fraction, by estimation, is 60 to 65%. The left ventricle has normal function.  The left ventricle has no regional wall motion abnormalities. Left ventricular diastolic parameters were normal.  2. Right ventricular systolic function is normal. The right ventricular size is normal. There is normal pulmonary artery systolic pressure.  3. The mitral valve is normal in structure. Trivial mitral valve regurgitation. No evidence of mitral stenosis.  4. The aortic valve was not well visualized. Aortic valve regurgitation is not visualized. Mild to moderate aortic valve sclerosis/calcification is present, without any evidence of aortic stenosis.  5. The inferior vena cava is normal in size with greater than 50% respiratory variability, suggesting right atrial pressure of 3 mmHg.  6. Agitated saline contrast bubble study was negative, with no evidence of any interatrial shunt. FINDINGS  Left Ventricle: Left ventricular ejection fraction, by estimation, is 60 to 65%. The left ventricle has normal function. The left ventricle has no regional wall motion abnormalities. The left ventricular internal cavity size was normal in size. There is  no left ventricular hypertrophy. Left ventricular diastolic parameters were normal. Normal left ventricular filling pressure. Right Ventricle: The right ventricular size is normal. No increase in right ventricular wall thickness. Right ventricular systolic function is normal. There is normal pulmonary artery systolic pressure. The tricuspid regurgitant velocity is 2.39 m/s, and  with an assumed right atrial pressure of 3 mmHg, the estimated right ventricular systolic pressure is 83.6 mmHg. Left Atrium: Left atrial size was normal in size. Right Atrium: Right atrial size was normal in size. Pericardium: There is no evidence of pericardial effusion. Mitral Valve: The mitral valve is normal in structure. Mild mitral annular calcification. Trivial mitral valve regurgitation. No evidence of mitral valve stenosis. Tricuspid Valve: The tricuspid valve is normal in structure.  Tricuspid valve regurgitation is trivial. No evidence of tricuspid stenosis. Aortic Valve: The aortic valve was not well visualized. Aortic valve regurgitation is not visualized. Mild to moderate aortic valve sclerosis/calcification is present, without any evidence of aortic  stenosis. Pulmonic Valve: The pulmonic valve was normal in structure. Pulmonic valve regurgitation is not visualized. No evidence of pulmonic stenosis. Aorta: The aortic root is normal in size and structure. Venous: The inferior vena cava is normal in size with greater than 50% respiratory variability, suggesting right atrial pressure of 3 mmHg. IAS/Shunts: No atrial level shunt detected by color flow Doppler. Agitated saline contrast was given intravenously to evaluate for intracardiac shunting. Agitated saline contrast bubble study was negative, with no evidence of any interatrial shunt. There  is no evidence of a patent foramen ovale. There is no evidence of an atrial septal defect.  LEFT VENTRICLE PLAX 2D LVIDd:         3.60 cm  Diastology LVIDs:         2.40 cm  LV e' medial:    10.40 cm/s LV PW:         1.00 cm  LV E/e' medial:  7.4 LV IVS:        0.90 cm  LV e' lateral:   10.70 cm/s LVOT diam:     2.10 cm  LV E/e' lateral: 7.2 LVOT Area:     3.46 cm  RIGHT VENTRICLE             IVC RV S prime:     13.20 cm/s  IVC diam: 1.10 cm TAPSE (M-mode): 1.7 cm LEFT ATRIUM             Index       RIGHT ATRIUM          Index LA diam:        3.00 cm 1.87 cm/m  RA Area:     8.63 cm LA Vol (A2C):   26.5 ml 16.49 ml/m RA Volume:   15.70 ml 9.77 ml/m LA Vol (A4C):   26.9 ml 16.74 ml/m LA Biplane Vol: 26.5 ml 16.49 ml/m   AORTA Ao Asc diam: 3.00 cm MITRAL VALVE               TRICUSPID VALVE MV Area (PHT): 3.53 cm    TR Peak grad:   22.8 mmHg MV Decel Time: 215 msec    TR Vmax:        239.00 cm/s MV E velocity: 77.10 cm/s MV A velocity: 78.00 cm/s  SHUNTS MV E/A ratio:  0.99        Systemic Diam: 2.10 cm Fransico Him MD Electronically signed by Fransico Him MD Signature Date/Time: 07/24/2020/4:09:20 PM    Final         Scheduled Meds: . aspirin EC  325 mg Oral Daily  . atorvastatin  80 mg Oral Daily  . cyanocobalamin  1,000 mcg Subcutaneous Daily   Followed by  . [START ON 07/31/2020] vitamin B-12  1,000 mcg Oral Daily  . dextromethorphan-guaiFENesin  2 tablet Oral BID  . enoxaparin (LOVENOX) injection  40 mg Subcutaneous Q24H  . insulin aspart  0-6 Units Subcutaneous TID AC & HS  . multivitamin with minerals  1 tablet Oral Daily  . pantoprazole  40 mg Oral Daily  . [START ON 07/27/2020] thiamine injection  100 mg Intravenous Daily   Continuous Infusions: . sodium chloride Stopped (07/25/20 0357)  . sodium chloride 10 mL/hr at 07/25/20 0400  . azithromycin Stopped (07/24/20 2154)  . cefTRIAXone (ROCEPHIN)  IV Stopped (07/24/20 1807)  . thiamine injection 400 mg (07/25/20 0547)     LOS: 2 days    Time spent: 63min  Steven Veazie C Mcihael Hinderman,  DO [facility]  Pager: Secure chat  If 7PM-7AM, please contact night-coverage [email or web address]  07/25/2020, 7:04 AM

## 2020-07-26 ENCOUNTER — Inpatient Hospital Stay (HOSPITAL_COMMUNITY): Payer: BC Managed Care – PPO

## 2020-07-26 ENCOUNTER — Encounter (HOSPITAL_COMMUNITY)
Admission: EM | Disposition: A | Discharge status: Home / Self Care | Payer: Self-pay | Source: Home / Self Care | Attending: Internal Medicine

## 2020-07-26 ENCOUNTER — Inpatient Hospital Stay (HOSPITAL_COMMUNITY): Payer: BC Managed Care – PPO | Admitting: Certified Registered"

## 2020-07-26 ENCOUNTER — Encounter (HOSPITAL_COMMUNITY): Payer: Self-pay | Admitting: Internal Medicine

## 2020-07-26 DIAGNOSIS — R000 Tachycardia, unspecified: Secondary | ICD-10-CM

## 2020-07-26 DIAGNOSIS — I639 Cerebral infarction, unspecified: Secondary | ICD-10-CM

## 2020-07-26 DIAGNOSIS — E43 Unspecified severe protein-calorie malnutrition: Secondary | ICD-10-CM

## 2020-07-26 DIAGNOSIS — D500 Iron deficiency anemia secondary to blood loss (chronic): Secondary | ICD-10-CM

## 2020-07-26 LAB — GLUCOSE, CAPILLARY
Glucose-Capillary: 85 mg/dL (ref 70–99)
Glucose-Capillary: 101 mg/dL — ABNORMAL HIGH (ref 70–99)
Glucose-Capillary: 90 mg/dL (ref 70–99)

## 2020-07-26 LAB — CBC
HCT: 26.9 % — ABNORMAL LOW (ref 36.0–46.0)
Hemoglobin: 8.5 g/dL — ABNORMAL LOW (ref 12.0–15.0)
MCH: 26.2 pg (ref 26.0–34.0)
MCHC: 31.6 g/dL (ref 30.0–36.0)
MCV: 83 fL (ref 80.0–100.0)
Platelets: 468 10*3/uL — ABNORMAL HIGH (ref 150–400)
RBC: 3.24 MIL/uL — ABNORMAL LOW (ref 3.87–5.11)
RDW: 18 % — ABNORMAL HIGH (ref 11.5–15.5)
WBC: 15.8 10*3/uL — ABNORMAL HIGH (ref 4.0–10.5)
nRBC: 0.1 % (ref 0.0–0.2)

## 2020-07-26 LAB — TYPE AND SCREEN
ABO/RH(D): O POS
Antibody Screen: NEGATIVE
Unit division: 0

## 2020-07-26 LAB — ENA+DNA/DS+ANTICH+CENTRO+JO...
Anti JO-1: <0.2 AI (ref 0.0–0.9)
Centromere Ab Screen: <0.2 AI (ref 0.0–0.9)
Chromatin Ab SerPl-aCnc: 6.4 AI — ABNORMAL HIGH (ref 0.0–0.9)
ENA SM Ab Ser-aCnc: 4 AI — ABNORMAL HIGH (ref 0.0–0.9)
Ribonucleic Protein: >8 AI — ABNORMAL HIGH (ref 0.0–0.9)
SSA (Ro) (ENA) Antibody, IgG: <0.2 AI (ref 0.0–0.9)
SSB (La) (ENA) Antibody, IgG: <0.2 AI (ref 0.0–0.9)
Scleroderma (Scl-70) (ENA) Antibody, IgG: <0.2 AI (ref 0.0–0.9)
ds DNA Ab: <1 IU/mL (ref 0–9)

## 2020-07-26 LAB — BPAM RBC
Blood Product Expiration Date: 202110132359
ISSUE DATE / TIME: 202109121403
Unit Type and Rh: 5100

## 2020-07-26 LAB — URINALYSIS, ROUTINE W REFLEX MICROSCOPIC
Bilirubin Urine: NEGATIVE
Glucose, UA: NEGATIVE mg/dL
Hgb urine dipstick: NEGATIVE
Ketones, ur: 20 mg/dL — AB
Leukocytes,Ua: NEGATIVE
Nitrite: NEGATIVE
Protein, ur: NEGATIVE mg/dL
Specific Gravity, Urine: 1.014 (ref 1.005–1.030)
pH: 5 (ref 5.0–8.0)

## 2020-07-26 LAB — COMPREHENSIVE METABOLIC PANEL
ALT: 8 U/L (ref 0–44)
AST: 10 U/L — ABNORMAL LOW (ref 15–41)
Albumin: 1.4 g/dL — ABNORMAL LOW (ref 3.5–5.0)
Alkaline Phosphatase: 78 U/L (ref 38–126)
Anion gap: 9 (ref 5–15)
BUN: <5 mg/dL — ABNORMAL LOW (ref 8–23)
CO2: 20 mmol/L — ABNORMAL LOW (ref 22–32)
Calcium: 8.1 mg/dL — ABNORMAL LOW (ref 8.9–10.3)
Chloride: 106 mmol/L (ref 98–111)
Creatinine, Ser: 0.67 mg/dL (ref 0.44–1.00)
GFR calc Af Amer: >60 mL/min (ref >60)
GFR calc non Af Amer: >60 mL/min (ref >60)
Glucose, Bld: 97 mg/dL (ref 70–99)
Potassium: 3.5 mmol/L (ref 3.5–5.1)
Sodium: 135 mmol/L (ref 135–145)
Total Bilirubin: 0.4 mg/dL (ref 0.3–1.2)
Total Protein: 6 g/dL — ABNORMAL LOW (ref 6.5–8.1)

## 2020-07-26 LAB — ANA W/REFLEX IF POSITIVE: Anti Nuclear Antibody (ANA): POSITIVE — AB

## 2020-07-26 LAB — RAPID URINE DRUG SCREEN, HOSP PERFORMED
Amphetamines: NOT DETECTED
Barbiturates: NOT DETECTED
Benzodiazepines: NOT DETECTED
Cocaine: NOT DETECTED
Opiates: NOT DETECTED
Tetrahydrocannabinol: NOT DETECTED

## 2020-07-26 SURGERY — INVASIVE LAB ABORTED CASE
Anesthesia: Monitor Anesthesia Care

## 2020-07-26 MED ORDER — LIDOCAINE 2% (20 MG/ML) 5 ML SYRINGE
INTRAMUSCULAR | Status: DC | PRN
  Administered 2020-07-26: 30 mg via INTRAVENOUS

## 2020-07-26 MED ORDER — ATORVASTATIN CALCIUM 80 MG PO TABS
80.0000 mg | ORAL_TABLET | Freq: Every day | ORAL | 0 refills | Status: DC
Start: 2020-07-27 — End: 2020-08-17

## 2020-07-26 MED ORDER — CLOPIDOGREL BISULFATE 75 MG PO TABS
75.0000 mg | ORAL_TABLET | Freq: Every day | ORAL | Status: DC
  Administered 2020-07-26: 75 mg via ORAL
  Filled 2020-07-26: qty 1

## 2020-07-26 MED ORDER — HYDRALAZINE HCL 20 MG/ML IJ SOLN
10.0000 mg | Freq: Four times a day (QID) | INTRAMUSCULAR | Status: DC | PRN
  Filled 2020-07-26: qty 1

## 2020-07-26 MED ORDER — LABETALOL HCL 5 MG/ML IV SOLN
INTRAVENOUS | Status: AC
  Filled 2020-07-26: qty 4

## 2020-07-26 MED ORDER — CLOPIDOGREL BISULFATE 75 MG PO TABS
75.0000 mg | ORAL_TABLET | Freq: Every day | ORAL | 0 refills | Status: DC
Start: 2020-07-26 — End: 2020-08-17

## 2020-07-26 MED ORDER — LABETALOL HCL 5 MG/ML IV SOLN
5.0000 mg | INTRAVENOUS | Status: AC
  Administered 2020-07-26: 5 mg via INTRAVENOUS

## 2020-07-26 MED ORDER — ASPIRIN EC 81 MG PO TBEC
81.0000 mg | DELAYED_RELEASE_TABLET | Freq: Every day | ORAL | Status: DC
  Administered 2020-07-26: 81 mg via ORAL
  Filled 2020-07-26: qty 1

## 2020-07-26 MED ORDER — PROPOFOL 500 MG/50ML IV EMUL
INTRAVENOUS | Status: DC | PRN
  Administered 2020-07-26: 150 ug/kg/min via INTRAVENOUS

## 2020-07-26 NOTE — Anesthesia Preprocedure Evaluation (Addendum)
Anesthesia Evaluation  Patient identified by MRN, date of birth, ID band Patient awake    Reviewed: Allergy & Precautions, NPO status , Patient's Chart, lab work & pertinent test results  Airway Mallampati: I  TM Distance: >3 FB Neck ROM: Full    Dental  (+) Missing, Poor Dentition, Edentulous Upper,    Pulmonary former smoker,    Pulmonary exam normal        Cardiovascular hypertension, Pt. on medications  Rhythm:Regular Rate:Normal     Neuro/Psych CVA negative psych ROS   GI/Hepatic Neg liver ROS, GERD  Medicated,  Endo/Other  negative endocrine ROS  Renal/GU negative Renal ROS     Musculoskeletal negative musculoskeletal ROS (+)   Abdominal Normal abdominal exam  (+)   Peds  Hematology negative hematology ROS (+)   Anesthesia Other Findings   Reproductive/Obstetrics                            Anesthesia Physical Anesthesia Plan  ASA: II  Anesthesia Plan: MAC   Post-op Pain Management:    Induction: Intravenous  PONV Risk Score and Plan: Propofol infusion and Ondansetron  Airway Management Planned: Natural Airway and Simple Face Mask  Additional Equipment: None  Intra-op Plan:   Post-operative Plan:   Informed Consent: I have reviewed the patients History and Physical, chart, labs and discussed the procedure including the risks, benefits and alternatives for the proposed anesthesia with the patient or authorized representative who has indicated his/her understanding and acceptance.       Plan Discussed with: CRNA  Anesthesia Plan Comments: (Echo:  1. Left ventricular ejection fraction, by estimation, is 60 to 65%. The  left ventricle has normal function. The left ventricle has no regional  wall motion abnormalities. Left ventricular diastolic parameters were  normal.  2. Right ventricular systolic function is normal. The right ventricular  size is normal. There  is normal pulmonary artery systolic pressure.  3. The mitral valve is normal in structure. Trivial mitral valve  regurgitation. No evidence of mitral stenosis.  4. The aortic valve was not well visualized. Aortic valve regurgitation  is not visualized. Mild to moderate aortic valve sclerosis/calcification  is present, without any evidence of aortic stenosis.  5. The inferior vena cava is normal in size with greater than 50%  respiratory variability, suggesting right atrial pressure of 3 mmHg.  6. Agitated saline contrast bubble study was negative, with no evidence  of any interatrial shunt. )      Anesthesia Quick Evaluation

## 2020-07-26 NOTE — Interval H&P Note (Signed)
History and Physical Interval Note:  07/26/2020 10:40 AM  [name]  has presented today for surgery, with the diagnosis of stroke.  The various methods of treatment have been discussed with the patient and family. After consideration of risks, benefits and other options for treatment, the patient has consented to  Procedure(s): TRANSESOPHAGEAL ECHOCARDIOGRAM (TEE) (N/A) as a surgical intervention.  The patient's history has been reviewed, patient examined, no change in status, stable for surgery.  I have reviewed the patient's chart and labs.  Questions were answered to the patient's satisfaction.     Fransico Him

## 2020-07-26 NOTE — Consult Note (Addendum)
ELECTROPHYSIOLOGY CONSULT NOTE  Patient ID: [name] MRN: [id number], DOB/AGE: 06-06-1962   Admit date: 07/23/2020 Date of Consult: 07/26/2020  Primary Physician: London Pepper, MD Primary Cardiologist: none Reason for Consultation: Cryptogenic stroke ; recommendations regarding Implantable Loop Recorder, requested by Dr. Leonie Man  History of Present Illness [name] was admitted on 07/23/2020 with progressive weakness, poor appetite, , husband noted gait change, found with stroke, pneumonia.    IM note mentions: Her husband reports new cough since last night and significant weight loss for the last several months.  Poor appetite and poor oral intake  PMHx includes: prior stroke, (unknown etiology, possible fibromuscular dysplasia) (Dr. Leonie Man in 2006 workup was notable for elevated homocysteine and hypercoagulable panel, hypertension, hx of abdominal mass 2006 felt to be benign, smoking, complicated by the development of anorexia   neurology noted Acute to early subacute anterior left frontal lobe infarct, left MCA distribution -  A few additional punctate right cerebral cortical infarcts - embolic - source unknown.  she has undergone workup for stroke including echocardiogram and carotid dopplers.  The patient has been monitored on telemetry which has demonstrated sinus rhythm with no arrhythmias.  Inpatient stroke work-up is to be completed with a TEE.   Echocardiogram this admission demonstrated   07/24/2020 IMPRESSIONS   1. Left ventricular ejection fraction, by estimation, is 60 to 65%. The  left ventricle has normal function. The left ventricle has no regional  wall motion abnormalities. Left ventricular diastolic parameters were  normal.   2. Right ventricular systolic function is normal. The right ventricular  size is normal. There is normal pulmonary artery systolic pressure.   3. The mitral valve is normal in structure. Trivial mitral valve  regurgitation. No evidence  of mitral stenosis.   4. The aortic valve was not well visualized. Aortic valve regurgitation  is not visualized. Mild to moderate aortic valve sclerosis/calcification  is present, without any evidence of aortic stenosis.   5. The inferior vena cava is normal in size with greater than 50%  respiratory variability, suggesting right atrial pressure of 3 mmHg.   6. Agitated saline contrast bubble study was negative, with no evidence  of any interatrial shunt.    Lab work is reviewed. She remains with elevated WBC, deferred to IM   Prior to admission, the patient denies chest pain, shortness of breath, dizziness, palpitations, or syncope.   They are recovering from their stroke with plans to home at discharge.   The patient os AAO x4, she tel;ls me her husband was worried about her and brought her to the hospital, and tells me she had a stroke.   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   Cerebrovascular accident (stroke) ([place]) 2005   Dental caries 01/27/2008   GERD (gastroesophageal reflux disease) 11/27/2006   Hyperhomocysteinemia ([place]) 11/23/2006   history of mild   Hypertension 11/23/2006     Surgical History:  Past Surgical History:  Procedure Laterality Date   CESAREAN SECTION     cyst removed from right side  yrs ago   EUS N/A 01/07/2014   Procedure: ESOPHAGEAL ENDOSCOPIC ULTRASOUND (EUS) RADIAL;  Surgeon: Arta Silence, MD;  Location: [facility];  Service: Endoscopy;  Laterality: N/A;   FINE NEEDLE ASPIRATION N/A 01/07/2014   Procedure: FINE NEEDLE ASPIRATION (FNA) LINEAR;  Surgeon: Arta Silence, MD;  Location: [facility];  Service: Endoscopy;  Laterality: N/A;   TUBAL  LIGATION  yrs ago     Medications Prior to Admission  Medication Sig Dispense Refill Last Dose   acetaminophen (TYLENOL) 500 MG tablet Take 500 mg by mouth every 6 (six) hours as needed for moderate pain or headache.    07/23/2020 at Unknown time   aspirin EC 81 MG tablet Take 81 mg by mouth daily. Swallow whole.   07/23/2020 at Unknown time   Calcium Carbonate Antacid (TUMS E-X PO) Take 1 tablet by mouth as needed.   Past Week at Unknown time   lisinopril (PRINIVIL,ZESTRIL) 20 MG tablet Take 20 mg by mouth every morning.   07/23/2020 at Unknown time   pantoprazole (PROTONIX) 40 MG tablet Take 40 mg by mouth daily.   07/23/2020 at Unknown time   dipyridamole-aspirin (AGGRENOX) 200-25 MG per 12 hr capsule Take 1 capsule by mouth 2 (two) times daily. (Patient not taking: Reported on 07/24/2020)   Not Taking at Unknown time    Inpatient Medications:   aspirin EC  325 mg Oral Daily   atorvastatin  80 mg Oral Daily   cyanocobalamin  1,000 mcg Subcutaneous Daily   Followed by   Derrill Memo ON 07/31/2020] vitamin B-12  1,000 mcg Oral Daily   dextromethorphan-guaiFENesin  2 tablet Oral BID   enoxaparin (LOVENOX) injection  40 mg Subcutaneous Q24H   insulin aspart  0-6 Units Subcutaneous TID AC & HS   multivitamin with minerals  1 tablet Oral Daily   pantoprazole  40 mg Oral Daily   [START ON 07/27/2020] thiamine injection  100 mg Intravenous Daily    Allergies: No Known Allergies  Social History   Socioeconomic History   Marital status: Married    Spouse name: Not on file   Number of children: Not on file   Years of education: Not on file   Highest education level: Not on file  Occupational History   Not on file  Tobacco Use   Smoking status: Former Smoker    Packs/day: 0.25    Years: 20.00    Pack years: 5.00    Types: Cigarettes    Quit date: 11/13/2013    Years since quitting: 6.7   Smokeless tobacco: Never Used  Substance and Sexual Activity   Alcohol use: Yes    Comment: occasional beer   Drug use: No   Sexual activity: Not on file  Other Topics Concern   Not on file  Social History Narrative   Not on file   Social Determinants of Health   Financial Resource Strain:    Difficulty of Paying  Living Expenses: Not on file  Food Insecurity:    Worried About Estate manager/land agent of Food in the Last Year: Not on file   [employer] of Food in the Last Year: Not on file  Transportation Needs:    Lack of Transportation (Medical): Not on file   Lack of Transportation (Non-Medical): Not on file  Physical Activity:    Days of Exercise per Week: Not on file   Minutes of Exercise per Session: Not on file  Stress:    Feeling of Stress : Not on file  Social Connections:    Frequency of Communication with Friends and Family: Not on file   Frequency of Social Gatherings with Friends and Family: Not on file   Attends Religious Services: Not on file   Active Member of Clubs or Organizations: Not on file   Attends Archivist Meetings: Not on file   Marital Status: Not on  file  Intimate Partner Violence:    Fear of Current or Ex-Partner: Not on file   Emotionally Abused: Not on file   Physically Abused: Not on file   Sexually Abused: Not on file     Family History  Problem Relation Age of Onset   Hypertension Mother    Hypertension Daughter       Review of Systems: All other systems reviewed and are otherwise negative except as noted above.  Physical Exam: Vitals:   07/25/20 2334 07/26/20 0046 07/26/20 0328 07/26/20 0721  BP: (!) 172/104 (!) 160/86 (!) 174/87 (!) 161/94  Pulse: (!) 109 (!) 102 (!) 106 (!) 106  Resp: 17  17 16   Temp: 98.3 F (36.8 C)  98 F (36.7 C) 98.3 F (36.8 C)  TempSrc: Oral  Oral Oral  SpO2: 95%  98% 98%  Weight:      Height:        GEN- The patient is well appearing, alert and oriented x 3 today.   Head- normocephalic, atraumatic Eyes-  Sclera clear, conjunctiva pink Ears- hearing intact Oropharynx- clear Neck- supple Lungs- CTA b/l, normal work of breathing Heart- RRR, tachycardic, no murmurs, rubs or gallops  GI- soft, NT, ND Extremities- no clubbing, cyanosis, or edema MS- no significant deformity or atrophy Skin- no rash or  lesion Psych- euthymic mood, full affect   Labs:   Lab Results  Component Value Date   WBC 15.8 (H) 07/26/2020   HGB 8.5 (L) 07/26/2020   HCT 26.9 (L) 07/26/2020   MCV 83.0 07/26/2020   PLT 468 (H) 07/26/2020    Recent Labs  Lab 07/26/20 0256  NA 135  K 3.5  CL 106  CO2 20*  BUN <5*  CREATININE 0.67  CALCIUM 8.1*  PROT 6.0*  BILITOT 0.4  ALKPHOS 78  ALT 8  AST 10*  GLUCOSE 97   No results found for: CKTOTAL, CKMB, CKMBINDEX, TROPONINI Lab Results  Component Value Date   CHOL 119 07/23/2020   CHOL 153 01/03/2010   CHOL 157 01/27/2008   Lab Results  Component Value Date   HDL 59 07/23/2020   HDL 85 01/03/2010   HDL 81 01/27/2008   Lab Results  Component Value Date   LDLCALC 41 07/23/2020   LDLCALC 52 01/03/2010   LDLCALC 58 01/27/2008   Lab Results  Component Value Date   TRIG 95 07/23/2020   TRIG 82 01/03/2010   TRIG 92 01/27/2008   Lab Results  Component Value Date   CHOLHDL 2.0 07/23/2020   CHOLHDL 1.8 Ratio 01/03/2010   CHOLHDL 1.9 Ratio 01/27/2008   No results found for: LDLDIRECT  No results found for: DDIMER   Radiology/Studies:   CT Head Wo Contrast Result Date: 07/23/2020 CLINICAL DATA:  Neuro deficit, acute, stroke suspected. Additional history provided: Sudden onset unsteady gait, stroke 2 years ago affecting right side EXAM: CT HEAD WITHOUT CONTRAST TECHNIQUE: Contiguous axial images were obtained from the base of the skull through the vertex without intravenous contrast. COMPARISON:  Noncontrast head CT 01/21/2005 FINDINGS: Brain: There is a 3.5 cm focus of cortical/subcortical hypodensity within the anterolateral left frontal lobe consistent with acute/early subacute infarction (for instance as seen on series 3, image 15). There is no significant mass effect at this time. Redemonstrated chronic cortically based infarct predominantly affecting portions of the right temporal and parietal lobes as well as posterior right insula. Chronic  infarction changes also affect the posterior right thalamus. Background mild ill-defined hypoattenuation within the  cerebral white matter is nonspecific, but consistent with chronic small vessel ischemic disease. There is no acute intracranial hemorrhage. No extra-axial fluid collection. No evidence of intracranial mass. Vascular:No hyperdense vessel.  Atherosclerotic calcifications. Skull: Normal. Negative for fracture or focal lesion. Sinuses/Orbits: Visualized orbits show no acute finding. Minimal scattered paranasal sinus mucosal thickening. No significant mastoid effusion. These results were called by telephone at the time of interpretation on 07/23/2020 at 6:48 pm to provider [facility] , who verbally acknowledged these results. IMPRESSION: 3.5 cm acute/early subacute cortical/subcortical infarct within the anterolateral left frontal lobe. Redemonstrated chronic right MCA vascular territory cortically based infarct. Chronic infarct within the posterior right thalamus. Background mild cerebral white matter chronic small vessel ischemic disease. Electronically Signed   By: Kellie Simmering DO   On: 07/23/2020 18:50     MR ANGIO HEAD WO CONTRAST Result Date: 07/24/2020 CLINICAL DATA:  Follow-up examination for acute stroke. EXAM: MRI HEAD WITHOUT CONTRAST MRA HEAD WITHOUT CONTRAST MRA NECK WITHOUT AND WITH CONTRAST TECHNIQUE: Multiplanar, multiecho pulse sequences of the brain and surrounding structures were obtained without intravenous contrast. Angiographic images of the Circle of Willis were obtained using MRA technique without intravenous contrast. Angiographic images of the neck were obtained using MRA technique without and with intravenous contrast. Carotid stenosis measurements (when applicable) are obtained utilizing NASCET criteria, using the distal internal carotid diameter as the denominator. CONTRAST:  42mL GADAVIST GADOBUTROL 1 MMOL/ML IV SOLN COMPARISON:  Prior CT from 07/23/2020. FINDINGS: MRI  HEAD FINDINGS Brain: Examination mildly degraded by motion artifact. Diffuse prominence of the CSF containing spaces compatible generalized age-related cerebral atrophy. 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 Associated mild chronic hemosiderin staining within this region. Confluent wedge-shaped area of restricted diffusion measuring up to 3.5 cm in diameter seen involving the anterior left frontal lobe, corresponding with abnormality on prior CT. Finding consistent with and ischemic infarct, acute to early subacute in appearance. Multiple additional scattered acute to early subacute ischemic infarcts seen involving the cortical and subcortical left frontal, parietal, and occipital lobes, somewhat watershed in distribution. Few additional punctate acute to early subacute ischemic cortical infarct noted involving the right frontal lobe (series 5, images 86, 85). No associated hemorrhage or mass effect about these infarcts. Gray-white matter differentiation otherwise maintained. No acute intracranial hemorrhage. No mass lesion or midline shift. Mild ex vacuo dilatation of the right lateral ventricle related to the chronic right MCA territory infarct. No hydrocephalus. No extra-axial fluid collection. Pituitary gland suprasellar region within normal limits. Vascular: Major intracranial vascular flow voids are maintained. Skull and upper cervical spine: Craniocervical junction within normal limits. Bone marrow signal intensity normal. No scalp soft tissue abnormality. Sinuses/Orbits: Globes and orbital soft tissues within normal limits. Mild scattered mucosal thickening noted within the ethmoidal air cells and maxillary sinuses. Paranasal sinuses are otherwise clear. No mastoid effusion. Inner ear structures grossly normal. Other: None. MRA HEAD FINDINGS ANTERIOR CIRCULATION: Examination mildly degraded by motion. Visualized  distal cervical segments of the internal carotid arteries are patent with antegrade flow. Scattered atheromatous irregularity throughout the carotid siphons without flow-limiting stenosis. ICA termini well perfused. A1 segments patent bilaterally. Normal anterior communicating artery complex. Anterior cerebral arteries patent distally without significant stenosis. No M1 stenosis or occlusion. Negative MCA bifurcations. No proximal MCA branch occlusion. Scattered atheromatous irregularity throughout the distal MCA branches bilaterally. Right MCA branches somewhat attenuated as compared to the left, in keeping with the chronic right  MCA territory infarct. POSTERIOR CIRCULATION: Vertebral arteries patent to the vertebrobasilar junction without stenosis. Right vertebral artery slightly dominant. Both picas patent proximally. Basilar mildly irregular but widely patent to its distal aspect without stenosis. Superior cerebral arteries patent bilaterally. 2 mm somewhat funnel shaped outpouching at the origin of the left SCA favored to reflect a vascular infundibulum. Both PCA supplied via the basilar or as well as prominent bilateral posterior communicating arteries. PCAs mildly irregular without proximal high-grade P1 and P2 stenosis. Focal severe distal right P3 stenosis noted (series 1224, image 5). No intracranial aneurysm. MRA NECK FINDINGS AORTIC ARCH: Visualized aortic arch of normal caliber. Bovine branching pattern with common origin of the right brachiocephalic and left common carotid arteries noted. No flow-limiting stenosis about the origin of the great vessels. RIGHT CAROTID SYSTEM: Right common carotid artery widely patent from its origin to the bifurcation. No significant atheromatous irregularity or narrowing about the right bifurcation. Right ICA widely patent distally without stenosis, dissection or occlusion. LEFT CAROTID SYSTEM: Left common carotid artery patent from its origin to the bifurcation without  stenosis. Minimal atheromatous irregularity about the left bifurcation without significant stenosis. Left ICA widely patent distally without stenosis, dissection, or occlusion. VERTEBRAL ARTERIES: Both vertebral arteries arise from the subclavian arteries. No proximal subclavian artery stenosis. Vertebral arteries widely patent within the neck without stenosis, dissection or occlusion. IMPRESSION: MRI HEAD IMPRESSION: 1. Acute to early subacute anterior left frontal lobe infarct, left MCA distribution, corresponding with abnormality from prior CT. No associated hemorrhage or mass effect. 2. Multiple additional acute to early subacute ischemic infarcts involving the left frontal, parietal, and occipital lobes, watershed in distribution. Few additional punctate right cerebral cortical infarcts as above. No associated hemorrhage. 3. Underlying chronic right MCA infarct, with additional remote right thalamic infarct. 4. Underlying age-related cerebral atrophy. MRA HEAD IMPRESSION: 1. Negative intracranial MRA for large vessel occlusion. 2. Mild diffuse atheromatous irregularity throughout the intracranial circulation. No proximal high-grade or correctable stenosis. MRA NECK IMPRESSION: Negative MRA of the neck with wide patency of both carotid artery systems and vertebral arteries. No hemodynamically significant stenosis or other acute vascular abnormality. Electronically Signed   By: Jeannine Boga M.D.   On: 07/24/2020 02:40     DG Chest Port 1 View Result Date: 07/23/2020 CLINICAL DATA:  Sepsis EXAM: PORTABLE CHEST 1 VIEW COMPARISON:  01/19/2005 FINDINGS: Single frontal view of the chest demonstrates dense consolidation at the left lung base compatible with pneumonia or aspiration. Diffuse interstitial prominence elsewhere within the lungs likely reflects scarring. No effusion or pneumothorax. There is elevation of the left hemidiaphragm. The cardiac silhouette is unremarkable. IMPRESSION: 1. Dense left  basilar consolidation, compatible with pneumonia or aspiration. Followup PA and lateral chest X-ray is recommended in 3-4 weeks following trial of antibiotic therapy to ensure resolution and exclude underlying malignancy. Electronically Signed   By: Randa Ngo M.D.   On: 07/23/2020 16:55     12-lead ECG ST All prior EKG's in EPIC reviewed with no documented atrial fibrillation  Telemetry ST, generally 100-110  Assessment and Plan:  1. Cryptogenic stroke The patient presents with cryptogenic stroke.  The patient has a TEE planned for this today.  I spoke at length with the patient about monitoring for afib with either a 30 day event monitor or an implantable loop recorder.   Risks, benefits, and alteratives to implantable loop recorder were discussed with the patient today.   The patient is AAOx4, at this time, the patient is very clear  ishe would like to hold off on monitoring for now, she would prefer to be seen in the office, and revisit monitoring.  "I want to take this slow"  I have EP follow up in place  2. Sinus Tach Perhaps 2/2 pneumonia, deferred to attending team 100-105 or so this AM, is improved  Please call with questions.   Renee Dyane Dustman, PA-C 07/26/2020  EP Attending  Agree with above. I will see her back in the office.  Carleene Overlie Katye Valek,MD

## 2020-07-26 NOTE — Progress Notes (Signed)
STROKE TEAM PROGRESS NOTE    INTERVAL HISTORY Patient is lying in bed.  She has no complaints.  TEE was attempted by Dr. Golden Hurter but probe could not be advanced beyond 30 cm due to obstruction and procedure was aborted. Vital signs at stable.  Neurological exam unchanged.  She has been been tolerating dysphagia 3 diet.   .  Telemetry monitoring does not show any A. fib.  TCD bubble study performed by me personally at the bedside shows no evidence of right-to-left shunt. Urine drug screen was negative.  Anticardiolipin antibodies and ANA are yet pending OBJECTIVE Vitals:   07/26/20 1230 07/26/20 1240 07/26/20 1300 07/26/20 1356  BP: 118/70 (!) 147/79 (!) 157/90 (!) 142/81  Pulse: 89 92 95 93  Resp: (!) 30 (!) 23 16 17   Temp:   98.4 F (36.9 C) 98.2 F (36.8 C)  TempSrc:   Oral Oral  SpO2: 100% 96% 100% 99%  Weight:      Height:        CBC:  Recent Labs  Lab 07/23/20 1649 07/23/20 1649 07/24/20 0318 07/24/20 0318 07/25/20 0146 07/26/20 0256  WBC 13.9*   < > 12.4*   < > 15.6* 15.8*  NEUTROABS 12.1*  --  9.0*  --   --   --   HGB 7.9*   < > 7.2*   < > 7.1* 8.5*  HCT 25.6*   < > 23.7*   < > 23.7* 26.9*  MCV 79.3*   < > 79.8*   < > 80.6 83.0  PLT 519*   < > 406*   < > 497* 468*   < > = values in this interval not displayed.    Basic Metabolic Panel:  Recent Labs  Lab 07/24/20 0318 07/24/20 0709 07/25/20 0146 07/26/20 0256  NA   < >  --  135 135  K   < >  --  4.4 3.5  CL   < >  --  110 106  CO2   < >  --  17* 20*  GLUCOSE   < >  --  113* 97  BUN   < >  --  8 <5*  CREATININE   < >  --  0.67 0.67  CALCIUM   < >  --  8.2* 8.1*  MG  --  2.3  --   --    < > = values in this interval not displayed.    Lipid Panel:     Component Value Date/Time   CHOL 119 07/23/2020 2026   TRIG 95 07/23/2020 2026   HDL 59 07/23/2020 2026   CHOLHDL 2.0 07/23/2020 2026   VLDL 19 07/23/2020 2026   LDLCALC 41 07/23/2020 2026   HgbA1c:  Lab Results  Component Value Date    HGBA1C 6.1 (H) 07/23/2020   Urine Drug Screen:     Component Value Date/Time   LABOPIA NONE DETECTED 07/26/2020 0836   COCAINSCRNUR NONE DETECTED 07/26/2020 0836   LABBENZ NONE DETECTED 07/26/2020 0836   AMPHETMU NONE DETECTED 07/26/2020 0836   THCU NONE DETECTED 07/26/2020 0836   LABBARB NONE DETECTED 07/26/2020 0836    Alcohol Level     Component Value Date/Time   ETH <10 07/23/2020 1649    IMAGING  CT Head Wo Contrast 07/23/2020 MPRESSION:  3.5 cm acute/early subacute cortical/subcortical infarct within the anterolateral left frontal lobe. Redemonstrated chronic right MCA vascular territory cortically based infarct. Chronic infarct within the posterior right thalamus. Background mild  cerebral white matter chronic small vessel ischemic disease.   MR BRAIN WO CONTRAST MR ANGIO HEAD WO CONTRAST MR ANGIO NECK W WO CONTRAST 07/24/2020 IMPRESSION:   MRI HEAD  1. Acute to early subacute anterior left frontal lobe infarct, left MCA distribution, corresponding with abnormality from prior CT. No associated hemorrhage or mass effect.  2. Multiple additional acute to early subacute ischemic infarcts involving the left frontal, parietal, and occipital lobes, watershed in distribution. Few additional punctate right cerebral cortical infarcts as above. No associated hemorrhage.  3. Underlying chronic right MCA infarct, with additional remote right thalamic infarct.  4. Underlying age-related cerebral atrophy.   MRA HEAD  1. Negative intracranial MRA for large vessel occlusion.  2. Mild diffuse atheromatous irregularity throughout the intracranial circulation. No proximal high-grade or correctable stenosis.   MRA NECK Negative MRA of the neck with wide patency of both carotid artery systems and vertebral arteries. No hemodynamically significant stenosis or other acute vascular abnormality.   DG Chest Port 1 View 07/23/2020 IMPRESSION:   Dense left basilar consolidation, compatible with  pneumonia or aspiration. Followup PA and lateral chest X-ray is recommended in 3-4 weeks following trial of antibiotic therapy to ensure resolution and exclude underlying malignancy.   Transthoracic Echocardiogram  07/24/2020 IMPRESSIONS  1. Left ventricular ejection fraction, by estimation, is 60 to 65%. The  left ventricle has normal function. The left ventricle has no regional  wall motion abnormalities. Left ventricular diastolic parameters were  normal.  2. Right ventricular systolic function is normal. The right ventricular  size is normal. There is normal pulmonary artery systolic pressure.  3. The mitral valve is normal in structure. Trivial mitral valve  regurgitation. No evidence of mitral stenosis.  4. The aortic valve was not well visualized. Aortic valve regurgitation  is not visualized. Mild to moderate aortic valve sclerosis/calcification  is present, without any evidence of aortic stenosis.  5. The inferior vena cava is normal in size with greater than 50%  respiratory variability, suggesting right atrial pressure of 3 mmHg.  6. Agitated saline contrast bubble study was negative, with no evidence  of any interatrial shunt.   ECG - ST rate 123 BPM. (See cardiology reading for complete details) TEE attempted but aborted as probe could not be advanced TCD bubble study negative for PFO  PHYSICAL EXAM Blood pressure (!) 142/81, pulse 93, temperature 98.2 F (36.8 C), temperature source Oral, resp. rate 17, height 5\' 1"  (1.549 m), weight 56.5 kg, SpO2 99 %. Frail malnourished looking middle-aged African-American lady not in distress. . Afebrile. Head is nontraumatic. Neck is supple without bruit.    Cardiac exam no murmur or gallop. Lungs are clear to auscultation. Distal pulses are well felt. Neurological Exam :  She is awake alert disoriented to time place and person.  Diminished attention, registration and recall.  She is calm and cooperative.  Speech is clear without  dysarthria or aphasia.  She can name and repeat well.  She has some right gaze preference but able to look to the left past midline.  Left dense homonymous hemianopsia.  Fundi not visualized.  Mild right lower facial asymmetry when she smiles.  Tongue midline.  Motor system exam able to move all 4 extremities against gravity upper extremities more than lower extremities but cooperation is variable and poor with effort inconsistent.  No apparent focal weakness.  Sensation appears intact bilaterally.  Deep tendon reflexes are symmetric.  Plantars downgoing.  Gait not tested.  ASSESSMENT/PLAN Ms. Mauricio Po  is a 59 y.o. 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 Leonie Man in 2006 workup was notable for elevated homocysteine and hypercoagulable panel). hypertension, hx of abdominal mass 2006 felt to be benign, smoking, complicated by the development of anorexia, presenting with worsening weakness, difficulty walking, AMS, fever felt to be secondary to pneumonia, and anemia. She did not receive IV t-PA due to late presentation (>4.5 hours from time of onset).  Stroke: Acute to early subacute anterior left frontal lobe infarct, left MCA distribution -  A few additional punctate right cerebral cortical infarcts - embolic - source unknown.  Resultant altered mental status and encephalopathy.  Old left homonymous hemianopsia.  Code Stroke CT Head - not ordered   CT head - 3.5 cm acute/early subacute cortical/subcortical infarct within the anterolateral left frontal lobe. Redemonstrated chronic right MCA vascular territory cortically based infarct. Chronic infarct within the posterior right thalamus. Background mild cerebral white matter chronic small vessel ischemic disease.   MRI head - Acute to early subacute anterior left frontal lobe infarct, left MCA distribution, corresponding with abnormality from prior CT. No associated hemorrhage or mass  effect. Multiple additional acute to early subacute ischemic infarcts involving the left frontal, parietal, and occipital lobes, watershed in distribution. Few additional punctate right cerebral cortical infarcts as above. No associated hemorrhage. Underlying chronic right MCA infarct, with additional remote right thalamic infarct. Underlying age-related cerebral atrophy.   MRA head - Negative intracranial MRA for large vessel occlusion. Mild diffuse atheromatous irregularity throughout the intracranial circulation. No proximal high-grade or correctable stenosis.  MRA neck - Negative MRA of the neck with wide patency of both carotid artery systems and vertebral arteries. No hemodynamically significant stenosis or other acute vascular abnormality.   CTA H&N - not ordered  CT Perfusion - not ordered  Carotid Doppler - MRA neck ordered - carotid dopplers not indicated  Anticardiolipin antibodies - pending  ANA panel - pending  2D Echo - EF 60 - 65%. No cardiac source of emboli identified. No evidence of any interatrial shunt.  Transcranial Doppler bubble study -negative for PFO bilateral Lower Extremity Venous Dopplers - pending TEE -attempted but procedure aborted due to failure to advance probe  [name] Virus 2 - negative  LDL - 41  HgbA1c - 6.1  UDS - pending  VTE prophylaxis - Lovenox Diet  Diet Order    None      dipyridamole SR 250 mg/aspirin 25 mg twice a day prior to admission, now on aspirin 325 mg daily  Patient will be counseled to be compliant with her antithrombotic medications  Ongoing aggressive stroke risk factor management  Therapy recommendations:  Outpatient OT and PT recommended  Disposition:  Pending  Hypertension  Home BP meds: Lisinopril  Current BP meds: none   Stable . Permissive hypertension (OK if < 220/120) but gradually normalize in 5-7 days  . Long-term BP goal normotensive  Hyperlipidemia  Home Lipid lowering medication: none    LDL 41, goal < 70  Current lipid lowering medication: Lipitor 80 mg daily   Continue statin at discharge  Pre Diabetes ?  Home diabetic meds: none   Current diabetic meds: SSI   HgbA1c 6.1, goal < 7.0 Recent Labs    07/26/20 0616 07/26/20 1307 07/26/20 1355  GLUCAP 85 101* 90    Other Stroke Risk Factors  Advanced age  Former cigarette smoker - quit  ETOH use, advised to drink no more than 1  alcoholic beverage per day.  Obesity, Body mass index is 23.54 kg/m., recommend weight loss, diet and exercise as appropriate   Hx stroke/TIA  Other Active Problems  Code status - Full Code  Malnutrition - Ensure PTA   Suspected pneumonia - Zithromax and Rocephin started 07/23/20. Lactic acid 1.5 (WNL)  CXR - Dense left basilar consolidation, compatible with pneumonia or aspiration. Followup PA and lateral chest X-ray is recommended in 3-4 weeks following trial of antibiotic therapy to ensure resolution and exclude underlying malignancy.   Tachycardia - likely related to fever and anemia  Anemia - Hgb - 7.2->7.1 (stool for occult blood - negative x 2)  Leukocytosis - WBC's - 13.9->12.4->15.6  ( temp - 98.4 ) Zithromax and Rocephin started 07/23/20 for CAP  Urine and blood cultures -  Negative so far  Hypokalemia - potassium - 3.5->3.2->4.4  Hyponatremia - Na - 132->133->135  Hospital day # 3  She has presented with altered mental status with MRI scan showing multiple bicerebral infarcts with largest 1 being the left frontal region etiology likely from central cardiac source.  She has remote history of large right MCA infarct in 2006 which was presumably of cryptogenic etiology and then she was lost to follow-up.   Transesophageal echocardiogram was unsuccessful.  TCD bubble study is negative for right-to-left shunt..  She will likely need   loop recorde ar duscharge.   and lower extremity venous Dopplers for DVT are pending.  Await results of anticardiolipin antibodies  and ANA panel. Continue aspirin and Plavix for 3 weeks followed by aspirin alone.  No family available at bedside for discussion.  Discussed with Dr. Avon Gully.  Greater than 50% time during this 25-minute visit was spent on counseling and coordination of care about her multiple embolic strokes and discussion about evaluation treatment plan and answering questions.  Stroke team will sign off.  Kindly call for questions.  Follow-up as an outpatient stroke clinic in 6 weeks.  Antony Contras, MD  To contact Stroke Continuity provider, please refer to [email or web address] After hours, contact General Neurology

## 2020-07-26 NOTE — Progress Notes (Signed)
Physical Therapy Treatment Patient Details Name: [name] MRN: [id number] DOB: 1961-05-17 Today's Date: 07/26/2020    History of Present Illness [name] is a 59 y.o. female with a past medical history significant for right MCA stroke in 2006, hypertension, smoking, complicated by the development of anorexia, presenting with worsening weakness, fever felt to be secondary to pneumonia, and anemia. MRI scan of the brain shows by cerebral embolic-looking infarcts with the largest one being in the left anterior frontal cortex with gyriform appearance.     PT Comments    Patient progressing well towards PT goals. Pt awaiting TEE today. Tolerated gait training with close min guard and use of IV pole PRN for support. Pt with RLE instability but no buckling. HR 123-135 bpm during session. Tolerated stair training with Min guard assist and cues for technique. Difficulty with following multi step commands and needing repetition at times for directions for path finding due to poor STM. Able to recall 1/3 words for STM. Will continue to follow for higher level balance training.    Follow Up Recommendations  Outpatient PT (neuro rehab)     Equipment Recommendations  None recommended by PT    Recommendations for Other Services       Precautions / Restrictions Precautions Precautions: Fall Restrictions Weight Bearing Restrictions: No    Mobility  Bed Mobility Overal bed mobility: Needs Assistance Bed Mobility: Sidelying to Sit;Sit to Sidelying   Sidelying to sit: Mod assist;HOB elevated     Sit to sidelying: Min guard;HOB elevated General bed mobility comments: Pt pulling up on therapist to elevate trunk to get to EOB; able to return to sidelying without assist.  Transfers Overall transfer level: Needs assistance Equipment used: None Transfers: Sit to/from Stand Sit to Stand: Supervision         General transfer comment: Supervision for safety. Stood from [employer], from  chair x1. Reaching for counter for support.  Ambulation/Gait Ambulation/Gait assistance: Min guard Gait Distance (Feet): 175 Feet Assistive device: IV Pole;None Gait Pattern/deviations: Step-through pattern;Decreased stride length;Drifts right/left Gait velocity: decreased Gait velocity interpretation: <1.31 ft/sec, indicative of household ambulator General Gait Details: Slow, mildly unsteady gait holding onto IV pole for support vs nothing. HR 123-135 bpm. Some drifting noted. Easily distracted with noises/looking into rooms etc, fatigues needing seated rest break. Difficulty follwoing multi step commands neeing repetition for path finding in hallway.   Stairs Stairs: Yes Stairs assistance: Min guard Stair Management: One rail Left Number of Stairs: 2 (2 bouts) General stair comments: cues for technique and safety.   Wheelchair Mobility    Modified Rankin (Stroke Patients Only) Modified Rankin (Stroke Patients Only) Pre-Morbid Rankin Score: No significant disability Modified Rankin: Moderate disability     Balance Overall balance assessment: Needs assistance Sitting-balance support: Feet supported;No upper extremity supported Sitting balance-Leahy Scale: Good Sitting balance - Comments: Able to donn socks sitting EOB without support. Postural control: Posterior lean Standing balance support: During functional activity Standing balance-Leahy Scale: Fair                              Cognition Arousal/Alertness: Awake/alert Behavior During Therapy: Flat affect;WFL for tasks assessed/performed Overall Cognitive Status: No family/caregiver present to determine baseline cognitive functioning                                 General Comments: some difficulty following multiple  step commands, selective attention level. "November" for date. Repetition needed. 1/3 words stated for memory recall.      Exercises      General Comments General comments  (skin integrity, edema, etc.): HR 123-135 bpm during session.      Pertinent Vitals/Pain Pain Assessment: No/denies pain    Home Living                      Prior Function            PT Goals (current goals can now be found in the care plan section) Progress towards PT goals: Progressing toward goals    Frequency    Min 3X/week      PT Plan Current plan remains appropriate    Co-evaluation              AM-PAC PT "6 Clicks" Mobility   Outcome Measure  Help needed turning from your back to your side while in a flat bed without using bedrails?: None Help needed moving from lying on your back to sitting on the side of a flat bed without using bedrails?: A Lot Help needed moving to and from a bed to a chair (including a wheelchair)?: A Little Help needed standing up from a chair using your arms (e.g., wheelchair or bedside chair)?: A Little Help needed to walk in hospital room?: A Little Help needed climbing 3-5 steps with a railing? : A Little 6 Click Score: 18    End of Session Equipment Utilized During Treatment: Gait belt Activity Tolerance: Patient tolerated treatment well Patient left: in bed;with call bell/phone within reach;with bed alarm set Nurse Communication: Mobility status PT Visit Diagnosis: Other abnormalities of gait and mobility (R26.89);Muscle weakness (generalized) (M62.81);Hemiplegia and hemiparesis;Difficulty in walking, not elsewhere classified (R26.2);Other symptoms and signs involving the nervous system (R29.898) Hemiplegia - dominant/non-dominant: Dominant Hemiplegia - caused by: Cerebral infarction     Time: 5597-4163 PT Time Calculation (min) (ACUTE ONLY): 20 min  Charges:  $Gait Training: 8-22 mins                     Marisa Severin, PT, DPT Acute Rehabilitation Services Pager [phone] Office [place] 07/26/2020, 10:38 AM

## 2020-07-26 NOTE — Anesthesia Postprocedure Evaluation (Signed)
Anesthesia Post Note  Patient: Dyasia Firestine  Procedure(s) Performed: ABORTED TEE     Patient location during evaluation: PACU Anesthesia Type: MAC Level of consciousness: awake and alert Pain management: pain level controlled Vital Signs Assessment: post-procedure vital signs reviewed and stable Respiratory status: spontaneous breathing, nonlabored ventilation, respiratory function stable and patient connected to nasal cannula oxygen Cardiovascular status: stable and blood pressure returned to baseline Postop Assessment: no apparent nausea or vomiting Anesthetic complications: no   No complications documented.  Last Vitals:  Vitals:   07/26/20 1240 07/26/20 1300  BP: (!) 147/79 (!) 157/90  Pulse: 92 95  Resp: (!) 23 16  Temp:  36.9 C  SpO2: 96% 100%    Last Pain:  Vitals:   07/26/20 1300  TempSrc: Oral  PainSc:                  Effie Berkshire

## 2020-07-26 NOTE — Progress Notes (Signed)
Pt. Came back from procedure alert and verbally responsive and ambulated to the bathroom to void X 1 and pt back in bed with call light within reach and bed alarm on. Will continue to monitor. T [name]    07/26/20 1300  Vitals  Temp 98.4 F (36.9 C)  Temp Source Oral  BP (!) 157/90  MAP (mmHg) 112  BP Location Right Arm  BP Method Automatic  Patient Position (if appropriate) Sitting  Pulse Rate 95  Pulse Rate Source Monitor  Resp 16  MEWS COLOR  MEWS Score Color Green  Oxygen Therapy  SpO2 100 %  O2 Device Room Air  MEWS Score  MEWS Temp 0  MEWS Systolic 0  MEWS Pulse 0  MEWS RR 0  MEWS LOC 1  MEWS Score 1

## 2020-07-26 NOTE — Progress Notes (Signed)
Pt discharge education and instructions completed with pt and spouse at bedside. Both stated they understood and denies any questions. Pt IV and telemetry was removed. Pt to pick up electronically sent prescriptions from preferred pharmacy on file. Pt discharged home with spouse to transport her home. Pt transported off unit via wheelchair with spouse and belongings to the side. Peri Jefferson

## 2020-07-26 NOTE — Transfer of Care (Signed)
Immediate Anesthesia Transfer of Care Note  Patient: Paysen Goza  Procedure(s) Performed: ABORTED TEE  Patient Location: Endoscopy Unit  Anesthesia Type:MAC  Level of Consciousness: sedated and responds to stimulation  Airway & Oxygen Therapy: Patient Spontanous Breathing and Patient connected to nasal cannula oxygen  Post-op Assessment: Report given to RN, Post -op Vital signs reviewed and stable and Patient moving all extremities  Post vital signs: Reviewed and stable  Last Vitals:  Vitals Value Taken Time  BP 121/76 07/26/20 1218  Temp    Pulse 92 07/26/20 1218  Resp 20 07/26/20 1218  SpO2 96 % 07/26/20 1218  Vitals shown include unvalidated device data.  Last Pain:  Vitals:   07/26/20 1124  TempSrc: Oral  PainSc: 0-No pain         Complications: No complications documented.

## 2020-07-26 NOTE — CV Procedure (Signed)
    PROCEDURE NOTE:  Procedure:  Transesophageal echocardiogram Operator:  Fransico Him, MD Indications:  CVA Complications: None  During this procedure the patient is administered a total of Propofol 50 mg to achieve and maintain moderate conscious sedation and 21m of Lidocaine.  The patient's heart rate, blood pressure, and oxygen saturation are monitored continuously during the procedure by anesthesia Dr. GDonato Schultz   The TEE probe was easily passed to 30cm and then met significant resistance to further passage in the esophagus.  Several reintubations were attempted and each time the probe was passed to 30cm and then met resistance. The procedure was aborted.  The patient will need endoscopy but GI before any further attempts at TEE.  The patient was transferred back to their room in stable condition.  Signed: TFransico Him MD [facility]

## 2020-07-26 NOTE — Progress Notes (Signed)
TCD buddy study & lower extremity venous has been completed.   Preliminary results in CV Proc.   Abram Sander 07/26/2020 3:04 PM

## 2020-07-26 NOTE — Discharge Summary (Addendum)
Physician Discharge Summary  [name] FUX:[id number] DOB: June 05, 1961 DOA: 07/23/2020  PCP: London Pepper, MD  Admit date: 07/23/2020 Discharge date: 08/06/2020  Admitted From: Home Disposition: Home  Recommendations for Outpatient Follow-up:  1. Follow up with PCP in 1-2 weeks 2. Please follow up with outpatient PT as scheduled, with neurology in 6 weeks.  Home Health: None, set up for outpatient physical therapy Equipment/Devices: None  Discharge Condition: Stable CODE STATUS: Full Diet recommendation: Low-salt, low-fat diet.  Brief/Interim Summary: [name] a 59 y.o.femalewith medical history significant forprior CVA withmildright-sided deficitnot on antiplatelets for the past year, essential hypertension, GERD, microcytic anemia who presented to [facility] office due to confusion and worsened right-sided weakness. Last known well last night prior to bed. Her husbandreports new cough since last night and significant weight loss for the last several months. Poor appetite and poor oral intake.Denies use of NSAIDs, abdominal pain, diarrhea or constipation. She was sent to the ED for further evaluation and managementof her right sided weaness. In ED: patient noted to be febrile with T-max 102.9, heart rate 121, respiration rate of 29. Chest x-ray showing left basilar infiltrates with suspicion for pneumonia and aspiration. Lab studies remarkable for WBC 13.9K, hemoglobin 7.9K and MCV of 79. Neutrophilia 12.1. TRH was asked to admit.  Patient admitted as above with worsening fatigue confusion and unilateral weakness, patient noted to have acute embolic appearing stroke to the PCA, fortunately patient's deficits improved drastically.  Patient also had transient episode of fever, altered mental status and hypoxia consistent with sepsis secondary to pneumonia.  Patient was treated with antibiotics and has now completed course, she continues to undergo  hypercoagulable work-up with neurology given her MRI shows multiple strokes of various ages.  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  Continue aspirin, Plavix as well as high intensity statin, close follow-up with PCP in the next 1 to 2 weeks, outpatient physical therapy has been set up, patient will also need further evaluation work-up with neurology in 6 weeks as scheduled.  At this time patient is back to baseline, she and her husband otherwise are agreeable for discharge.   Discharge Diagnoses:  Active Problems:   Essential hypertension   Acute CVA (cerebrovascular accident) ([place])   Acute metabolic encephalopathy   Iron deficiency anemia due to chronic blood loss   Severe protein-calorie malnutrition [employer]: less than 60% of standard weight) ([place])   Sepsis due to pneumonia [facility])   Discharge Instructions  Discharge Instructions    Ambulatory referral to Occupational Therapy   Complete by: As directed    Ambulatory referral to Physical Therapy   Complete by: As directed    Call MD for:  difficulty breathing, headache or visual disturbances   Complete by: As directed    Call MD for:  persistant dizziness or light-headedness   Complete by: As directed    Call MD for:  temperature >100.4   Complete by: As directed    Diet - low sodium heart healthy   Complete by: As directed    Discharge instructions   Complete by: As directed    Follow-up with outpatient PT as scheduled   Increase activity slowly   Complete by: As directed      Allergies as of 07/26/2020   No Known Allergies     Medication List    STOP taking these medications   dipyridamole-aspirin 200-25  MG 12hr capsule Commonly known as: AGGRENOX     TAKE these medications   acetaminophen 500 MG tablet Commonly known as: TYLENOL Take 500 mg by mouth every 6 (six) hours as needed for moderate pain  or headache.   aspirin EC 81 MG tablet Take 81 mg by mouth daily. Swallow whole.   atorvastatin 80 MG tablet Commonly known as: LIPITOR Take 1 tablet (80 mg total) by mouth daily.   clopidogrel 75 MG tablet Commonly known as: PLAVIX Take 1 tablet (75 mg total) by mouth daily.   lisinopril 20 MG tablet Commonly known as: ZESTRIL Take 20 mg by mouth every morning.   pantoprazole 40 MG tablet Commonly known as: PROTONIX Take 40 mg by mouth daily.   TUMS E-X PO Take 1 tablet by mouth as needed.       Follow-up Information    Baldwin Jamaica, PA-C Follow up.   Specialty: Cardiology Why: 08/05/2020 @ 10:15AM, to discuss heart rhythm monitoring options Contact information: [address] [place] [address] [phone]        [facility] Follow up.   Specialty: Rehabilitation Why: The outpatient rehab will contact you for the first appointment Contact information: [place] [phone]             No Known Allergies  Consultations:  Neurology, cardiology   Procedures/Studies: CT Head Wo Contrast  Result Date: 07/23/2020 CLINICAL DATA:  Neuro deficit, acute, stroke suspected. Additional history provided: Sudden onset unsteady gait, stroke 2 years ago affecting right side EXAM: CT HEAD WITHOUT CONTRAST TECHNIQUE: Contiguous axial images were obtained from the base of the skull through the vertex without intravenous contrast. COMPARISON:  Noncontrast head CT 01/21/2005 FINDINGS: Brain: There is a 3.5 cm focus of cortical/subcortical hypodensity within the anterolateral left frontal lobe consistent with acute/early subacute infarction (for instance as seen on series 3, image 15). There is no significant mass effect at this time. Redemonstrated chronic cortically based infarct predominantly affecting portions of the right temporal and parietal lobes  as well as posterior right insula. Chronic infarction changes also affect the posterior right thalamus. Background mild ill-defined hypoattenuation within the cerebral white matter is nonspecific, but consistent with chronic small vessel ischemic disease. There is no acute intracranial hemorrhage. No extra-axial fluid collection. No evidence of intracranial mass. Vascular:No hyperdense vessel.  Atherosclerotic calcifications. Skull: Normal. Negative for fracture or focal lesion. Sinuses/Orbits: Visualized orbits show no acute finding. Minimal scattered paranasal sinus mucosal thickening. No significant mastoid effusion. These results were called by telephone at the time of interpretation on 07/23/2020 at 6:48 pm to provider [facility] , who verbally acknowledged these results. IMPRESSION: 3.5 cm acute/early subacute cortical/subcortical infarct within the anterolateral left frontal lobe. Redemonstrated chronic right MCA vascular territory cortically based infarct. Chronic infarct within the posterior right thalamus. Background mild cerebral white matter chronic small vessel ischemic disease. Electronically Signed   By: Kellie Simmering DO   On: 07/23/2020 18:50   MR ANGIO HEAD WO CONTRAST  Result Date: 07/24/2020 CLINICAL DATA:  Follow-up examination for acute stroke. EXAM: MRI HEAD WITHOUT CONTRAST MRA HEAD WITHOUT CONTRAST MRA NECK WITHOUT AND WITH CONTRAST TECHNIQUE: Multiplanar, multiecho pulse sequences of the brain and surrounding structures were obtained without intravenous contrast. Angiographic images of the Circle of Willis were obtained using MRA technique without intravenous contrast. Angiographic images of the neck were obtained using MRA technique without and with intravenous contrast.  Carotid stenosis measurements (when applicable) are obtained utilizing NASCET criteria, using the distal internal carotid diameter as the denominator. CONTRAST:  34mL GADAVIST GADOBUTROL 1 MMOL/ML IV SOLN COMPARISON:   Prior CT from 07/23/2020. FINDINGS: MRI HEAD FINDINGS Brain: Examination mildly degraded by motion artifact. Diffuse prominence of the CSF containing spaces compatible generalized age-related cerebral atrophy. 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 Associated mild chronic hemosiderin staining within this region. Confluent wedge-shaped area of restricted diffusion measuring up to 3.5 cm in diameter seen involving the anterior left frontal lobe, corresponding with abnormality on prior CT. Finding consistent with and ischemic infarct, acute to early subacute in appearance. Multiple additional scattered acute to early subacute ischemic infarcts seen involving the cortical and subcortical left frontal, parietal, and occipital lobes, somewhat watershed in distribution. Few additional punctate acute to early subacute ischemic cortical infarct noted involving the right frontal lobe (series 5, images 86, 85). No associated hemorrhage or mass effect about these infarcts. Gray-white matter differentiation otherwise maintained. No acute intracranial hemorrhage. No mass lesion or midline shift. Mild ex vacuo dilatation of the right lateral ventricle related to the chronic right MCA territory infarct. No hydrocephalus. No extra-axial fluid collection. Pituitary gland suprasellar region within normal limits. Vascular: Major intracranial vascular flow voids are maintained. Skull and upper cervical spine: Craniocervical junction within normal limits. Bone marrow signal intensity normal. No scalp soft tissue abnormality. Sinuses/Orbits: Globes and orbital soft tissues within normal limits. Mild scattered mucosal thickening noted within the ethmoidal air cells and maxillary sinuses. Paranasal sinuses are otherwise clear. No mastoid effusion. Inner ear structures grossly normal. Other: None. MRA HEAD FINDINGS ANTERIOR CIRCULATION:  Examination mildly degraded by motion. Visualized distal cervical segments of the internal carotid arteries are patent with antegrade flow. Scattered atheromatous irregularity throughout the carotid siphons without flow-limiting stenosis. ICA termini well perfused. A1 segments patent bilaterally. Normal anterior communicating artery complex. Anterior cerebral arteries patent distally without significant stenosis. No M1 stenosis or occlusion. Negative MCA bifurcations. No proximal MCA branch occlusion. Scattered atheromatous irregularity throughout the distal MCA branches bilaterally. Right MCA branches somewhat attenuated as compared to the left, in keeping with the chronic right MCA territory infarct. POSTERIOR CIRCULATION: Vertebral arteries patent to the vertebrobasilar junction without stenosis. Right vertebral artery slightly dominant. Both picas patent proximally. Basilar mildly irregular but widely patent to its distal aspect without stenosis. Superior cerebral arteries patent bilaterally. 2 mm somewhat funnel shaped outpouching at the origin of the left SCA favored to reflect a vascular infundibulum. Both PCA supplied via the basilar or as well as prominent bilateral posterior communicating arteries. PCAs mildly irregular without proximal high-grade P1 and P2 stenosis. Focal severe distal right P3 stenosis noted (series 1224, image 5). No intracranial aneurysm. MRA NECK FINDINGS AORTIC ARCH: Visualized aortic arch of normal caliber. Bovine branching pattern with common origin of the right brachiocephalic and left common carotid arteries noted. No flow-limiting stenosis about the origin of the great vessels. RIGHT CAROTID SYSTEM: Right common carotid artery widely patent from its origin to the bifurcation. No significant atheromatous irregularity or narrowing about the right bifurcation. Right ICA widely patent distally without stenosis, dissection or occlusion. LEFT CAROTID SYSTEM: Left common carotid artery  patent from its origin to the bifurcation without stenosis. Minimal atheromatous irregularity about the left bifurcation without significant stenosis. Left ICA widely patent distally without stenosis, dissection, or occlusion. VERTEBRAL ARTERIES: Both vertebral arteries arise from the subclavian arteries. No proximal  subclavian artery stenosis. Vertebral arteries widely patent within the neck without stenosis, dissection or occlusion. IMPRESSION: MRI HEAD IMPRESSION: 1. Acute to early subacute anterior left frontal lobe infarct, left MCA distribution, corresponding with abnormality from prior CT. No associated hemorrhage or mass effect. 2. Multiple additional acute to early subacute ischemic infarcts involving the left frontal, parietal, and occipital lobes, watershed in distribution. Few additional punctate right cerebral cortical infarcts as above. No associated hemorrhage. 3. Underlying chronic right MCA infarct, with additional remote right thalamic infarct. 4. Underlying age-related cerebral atrophy. MRA HEAD IMPRESSION: 1. Negative intracranial MRA for large vessel occlusion. 2. Mild diffuse atheromatous irregularity throughout the intracranial circulation. No proximal high-grade or correctable stenosis. MRA NECK IMPRESSION: Negative MRA of the neck with wide patency of both carotid artery systems and vertebral arteries. No hemodynamically significant stenosis or other acute vascular abnormality. Electronically Signed   By: Jeannine Boga M.D.   On: 07/24/2020 02:40   MR ANGIO NECK W WO CONTRAST  Result Date: 07/24/2020 CLINICAL DATA:  Follow-up examination for acute stroke. EXAM: MRI HEAD WITHOUT CONTRAST MRA HEAD WITHOUT CONTRAST MRA NECK WITHOUT AND WITH CONTRAST TECHNIQUE: Multiplanar, multiecho pulse sequences of the brain and surrounding structures were obtained without intravenous contrast. Angiographic images of the Circle of Willis were obtained using MRA technique without intravenous  contrast. Angiographic images of the neck were obtained using MRA technique without and with intravenous contrast. Carotid stenosis measurements (when applicable) are obtained utilizing NASCET criteria, using the distal internal carotid diameter as the denominator. CONTRAST:  71mL GADAVIST GADOBUTROL 1 MMOL/ML IV SOLN COMPARISON:  Prior CT from 07/23/2020. FINDINGS: MRI HEAD FINDINGS Brain: Examination mildly degraded by motion artifact. Diffuse prominence of the CSF containing spaces compatible generalized age-related cerebral atrophy. 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 Associated mild chronic hemosiderin staining within this region. Confluent wedge-shaped area of restricted diffusion measuring up to 3.5 cm in diameter seen involving the anterior left frontal lobe, corresponding with abnormality on prior CT. Finding consistent with and ischemic infarct, acute to early subacute in appearance. Multiple additional scattered acute to early subacute ischemic infarcts seen involving the cortical and subcortical left frontal, parietal, and occipital lobes, somewhat watershed in distribution. Few additional punctate acute to early subacute ischemic cortical infarct noted involving the right frontal lobe (series 5, images 86, 85). No associated hemorrhage or mass effect about these infarcts. Gray-white matter differentiation otherwise maintained. No acute intracranial hemorrhage. No mass lesion or midline shift. Mild ex vacuo dilatation of the right lateral ventricle related to the chronic right MCA territory infarct. No hydrocephalus. No extra-axial fluid collection. Pituitary gland suprasellar region within normal limits. Vascular: Major intracranial vascular flow voids are maintained. Skull and upper cervical spine: Craniocervical junction within normal limits. Bone marrow signal intensity normal. No scalp soft tissue  abnormality. Sinuses/Orbits: Globes and orbital soft tissues within normal limits. Mild scattered mucosal thickening noted within the ethmoidal air cells and maxillary sinuses. Paranasal sinuses are otherwise clear. No mastoid effusion. Inner ear structures grossly normal. Other: None. MRA HEAD FINDINGS ANTERIOR CIRCULATION: Examination mildly degraded by motion. Visualized distal cervical segments of the internal carotid arteries are patent with antegrade flow. Scattered atheromatous irregularity throughout the carotid siphons without flow-limiting stenosis. ICA termini well perfused. A1 segments patent bilaterally. Normal anterior communicating artery complex. Anterior cerebral arteries patent distally without significant stenosis. No M1 stenosis or occlusion. Negative MCA bifurcations. No proximal MCA branch occlusion. Scattered atheromatous  irregularity throughout the distal MCA branches bilaterally. Right MCA branches somewhat attenuated as compared to the left, in keeping with the chronic right MCA territory infarct. POSTERIOR CIRCULATION: Vertebral arteries patent to the vertebrobasilar junction without stenosis. Right vertebral artery slightly dominant. Both picas patent proximally. Basilar mildly irregular but widely patent to its distal aspect without stenosis. Superior cerebral arteries patent bilaterally. 2 mm somewhat funnel shaped outpouching at the origin of the left SCA favored to reflect a vascular infundibulum. Both PCA supplied via the basilar or as well as prominent bilateral posterior communicating arteries. PCAs mildly irregular without proximal high-grade P1 and P2 stenosis. Focal severe distal right P3 stenosis noted (series 1224, image 5). No intracranial aneurysm. MRA NECK FINDINGS AORTIC ARCH: Visualized aortic arch of normal caliber. Bovine branching pattern with common origin of the right brachiocephalic and left common carotid arteries noted. No flow-limiting stenosis about the origin  of the great vessels. RIGHT CAROTID SYSTEM: Right common carotid artery widely patent from its origin to the bifurcation. No significant atheromatous irregularity or narrowing about the right bifurcation. Right ICA widely patent distally without stenosis, dissection or occlusion. LEFT CAROTID SYSTEM: Left common carotid artery patent from its origin to the bifurcation without stenosis. Minimal atheromatous irregularity about the left bifurcation without significant stenosis. Left ICA widely patent distally without stenosis, dissection, or occlusion. VERTEBRAL ARTERIES: Both vertebral arteries arise from the subclavian arteries. No proximal subclavian artery stenosis. Vertebral arteries widely patent within the neck without stenosis, dissection or occlusion. IMPRESSION: MRI HEAD IMPRESSION: 1. Acute to early subacute anterior left frontal lobe infarct, left MCA distribution, corresponding with abnormality from prior CT. No associated hemorrhage or mass effect. 2. Multiple additional acute to early subacute ischemic infarcts involving the left frontal, parietal, and occipital lobes, watershed in distribution. Few additional punctate right cerebral cortical infarcts as above. No associated hemorrhage. 3. Underlying chronic right MCA infarct, with additional remote right thalamic infarct. 4. Underlying age-related cerebral atrophy. MRA HEAD IMPRESSION: 1. Negative intracranial MRA for large vessel occlusion. 2. Mild diffuse atheromatous irregularity throughout the intracranial circulation. No proximal high-grade or correctable stenosis. MRA NECK IMPRESSION: Negative MRA of the neck with wide patency of both carotid artery systems and vertebral arteries. No hemodynamically significant stenosis or other acute vascular abnormality. Electronically Signed   By: Jeannine Boga M.D.   On: 07/24/2020 02:40   MR BRAIN WO CONTRAST  Result Date: 07/24/2020 CLINICAL DATA:  Follow-up examination for acute stroke. EXAM: MRI  HEAD WITHOUT CONTRAST MRA HEAD WITHOUT CONTRAST MRA NECK WITHOUT AND WITH CONTRAST TECHNIQUE: Multiplanar, multiecho pulse sequences of the brain and surrounding structures were obtained without intravenous contrast. Angiographic images of the Circle of Willis were obtained using MRA technique without intravenous contrast. Angiographic images of the neck were obtained using MRA technique without and with intravenous contrast. Carotid stenosis measurements (when applicable) are obtained utilizing NASCET criteria, using the distal internal carotid diameter as the denominator. CONTRAST:  53mL GADAVIST GADOBUTROL 1 MMOL/ML IV SOLN COMPARISON:  Prior CT from 07/23/2020. FINDINGS: MRI HEAD FINDINGS Brain: Examination mildly degraded by motion artifact. Diffuse prominence of the CSF containing spaces compatible generalized age-related cerebral atrophy. 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 Associated mild chronic hemosiderin staining within this region. Confluent wedge-shaped area of restricted diffusion measuring up to 3.5 cm in diameter seen involving the anterior left frontal lobe, corresponding with abnormality on prior CT. Finding consistent with and ischemic infarct, acute to  early subacute in appearance. Multiple additional scattered acute to early subacute ischemic infarcts seen involving the cortical and subcortical left frontal, parietal, and occipital lobes, somewhat watershed in distribution. Few additional punctate acute to early subacute ischemic cortical infarct noted involving the right frontal lobe (series 5, images 86, 85). No associated hemorrhage or mass effect about these infarcts. Gray-white matter differentiation otherwise maintained. No acute intracranial hemorrhage. No mass lesion or midline shift. Mild ex vacuo dilatation of the right lateral ventricle related to the chronic right MCA territory infarct.  No hydrocephalus. No extra-axial fluid collection. Pituitary gland suprasellar region within normal limits. Vascular: Major intracranial vascular flow voids are maintained. Skull and upper cervical spine: Craniocervical junction within normal limits. Bone marrow signal intensity normal. No scalp soft tissue abnormality. Sinuses/Orbits: Globes and orbital soft tissues within normal limits. Mild scattered mucosal thickening noted within the ethmoidal air cells and maxillary sinuses. Paranasal sinuses are otherwise clear. No mastoid effusion. Inner ear structures grossly normal. Other: None. MRA HEAD FINDINGS ANTERIOR CIRCULATION: Examination mildly degraded by motion. Visualized distal cervical segments of the internal carotid arteries are patent with antegrade flow. Scattered atheromatous irregularity throughout the carotid siphons without flow-limiting stenosis. ICA termini well perfused. A1 segments patent bilaterally. Normal anterior communicating artery complex. Anterior cerebral arteries patent distally without significant stenosis. No M1 stenosis or occlusion. Negative MCA bifurcations. No proximal MCA branch occlusion. Scattered atheromatous irregularity throughout the distal MCA branches bilaterally. Right MCA branches somewhat attenuated as compared to the left, in keeping with the chronic right MCA territory infarct. POSTERIOR CIRCULATION: Vertebral arteries patent to the vertebrobasilar junction without stenosis. Right vertebral artery slightly dominant. Both picas patent proximally. Basilar mildly irregular but widely patent to its distal aspect without stenosis. Superior cerebral arteries patent bilaterally. 2 mm somewhat funnel shaped outpouching at the origin of the left SCA favored to reflect a vascular infundibulum. Both PCA supplied via the basilar or as well as prominent bilateral posterior communicating arteries. PCAs mildly irregular without proximal high-grade P1 and P2 stenosis. Focal severe  distal right P3 stenosis noted (series 1224, image 5). No intracranial aneurysm. MRA NECK FINDINGS AORTIC ARCH: Visualized aortic arch of normal caliber. Bovine branching pattern with common origin of the right brachiocephalic and left common carotid arteries noted. No flow-limiting stenosis about the origin of the great vessels. RIGHT CAROTID SYSTEM: Right common carotid artery widely patent from its origin to the bifurcation. No significant atheromatous irregularity or narrowing about the right bifurcation. Right ICA widely patent distally without stenosis, dissection or occlusion. LEFT CAROTID SYSTEM: Left common carotid artery patent from its origin to the bifurcation without stenosis. Minimal atheromatous irregularity about the left bifurcation without significant stenosis. Left ICA widely patent distally without stenosis, dissection, or occlusion. VERTEBRAL ARTERIES: Both vertebral arteries arise from the subclavian arteries. No proximal subclavian artery stenosis. Vertebral arteries widely patent within the neck without stenosis, dissection or occlusion. IMPRESSION: MRI HEAD IMPRESSION: 1. Acute to early subacute anterior left frontal lobe infarct, left MCA distribution, corresponding with abnormality from prior CT. No associated hemorrhage or mass effect. 2. Multiple additional acute to early subacute ischemic infarcts involving the left frontal, parietal, and occipital lobes, watershed in distribution. Few additional punctate right cerebral cortical infarcts as above. No associated hemorrhage. 3. Underlying chronic right MCA infarct, with additional remote right thalamic infarct. 4. Underlying age-related cerebral atrophy. MRA HEAD IMPRESSION: 1. Negative intracranial MRA for large vessel occlusion. 2. Mild diffuse atheromatous irregularity throughout the intracranial circulation. No proximal high-grade or correctable  stenosis. MRA NECK IMPRESSION: Negative MRA of the neck with wide patency of both carotid  artery systems and vertebral arteries. No hemodynamically significant stenosis or other acute vascular abnormality. Electronically Signed   By: Jeannine Boga M.D.   On: 07/24/2020 02:40   DG Chest Port 1 View  Result Date: 07/23/2020 CLINICAL DATA:  Sepsis EXAM: PORTABLE CHEST 1 VIEW COMPARISON:  01/19/2005 FINDINGS: Single frontal view of the chest demonstrates dense consolidation at the left lung base compatible with pneumonia or aspiration. Diffuse interstitial prominence elsewhere within the lungs likely reflects scarring. No effusion or pneumothorax. There is elevation of the left hemidiaphragm. The cardiac silhouette is unremarkable. IMPRESSION: 1. Dense left basilar consolidation, compatible with pneumonia or aspiration. Followup PA and lateral chest X-ray is recommended in 3-4 weeks following trial of antibiotic therapy to ensure resolution and exclude underlying malignancy. Electronically Signed   By: Randa Ngo M.D.   On: 07/23/2020 16:55   VAS [place] TRANSCRANIAL DOPPLER W BUBBLES  Result Date: 07/27/2020  Transcranial Doppler with Bubble Indications: Stroke. Performing Technologist: Abram Sander RVS  Examination Guidelines: A complete evaluation includes B-mode imaging, spectral Doppler, color Doppler, and power Doppler as needed of all accessible portions of each vessel. Bilateral testing is considered an integral part of a complete examination. Limited examinations for reoccurring indications may be performed as noted.  Summary: No HITS at rest or during Valsalva. Negative transcranial Doppler Bubble study with no evidence of right to left intracardiac communication.  A vascular evaluation was performed. The right Ophthalmic was studied. An IV was inserted into the patient's left forearm . Verbal informed consent was obtained.  Negative TCD Bubble study *See table(s) above for TCD measurements and observations.  Diagnosing physician: Antony Contras MD Electronically signed by Antony Contras  MD on 07/27/2020 at 12:34:04 PM.    Final    ECHOCARDIOGRAM COMPLETE BUBBLE STUDY  Result Date: 07/24/2020    ECHOCARDIOGRAM REPORT   Patient Name:   MALISHA MABEY Date of Exam: 07/24/2020 Medical Rec #:  [id number]     Height:       62.3 in Accession #:    [id number]    Weight:       132.0 lb Date of Birth:  01-04-1961     BSA:          1.607 m Patient Age:    60 years      BP:           108/67 mmHg Patient Gender: F             HR:           104 bpm. Exam Location:  Inpatient Procedure: 2D Echo and Saline Contrast Bubble Study Indications:    stroke 434.91  History:        Patient has prior history of Echocardiogram examinations, most                 recent 01/18/2005.  Sonographer:    Johny Chess Referring Phys: [id number] [place]  1. Left ventricular ejection fraction, by estimation, is 60 to 65%. The left ventricle has normal function. The left ventricle has no regional wall motion abnormalities. Left ventricular diastolic parameters were normal.  2. Right ventricular systolic function is normal. The right ventricular size is normal. There is normal pulmonary artery systolic pressure.  3. The mitral valve is normal in structure. Trivial mitral valve regurgitation. No evidence of mitral stenosis.  4. The aortic valve was not well  visualized. Aortic valve regurgitation is not visualized. Mild to moderate aortic valve sclerosis/calcification is present, without any evidence of aortic stenosis.  5. The inferior vena cava is normal in size with greater than 50% respiratory variability, suggesting right atrial pressure of 3 mmHg.  6. Agitated saline contrast bubble study was negative, with no evidence of any interatrial shunt. FINDINGS  Left Ventricle: Left ventricular ejection fraction, by estimation, is 60 to 65%. The left ventricle has normal function. The left ventricle has no regional wall motion abnormalities. The left ventricular internal cavity size was normal in size. There is  no left  ventricular hypertrophy. Left ventricular diastolic parameters were normal. Normal left ventricular filling pressure. Right Ventricle: The right ventricular size is normal. No increase in right ventricular wall thickness. Right ventricular systolic function is normal. There is normal pulmonary artery systolic pressure. The tricuspid regurgitant velocity is 2.39 m/s, and  with an assumed right atrial pressure of 3 mmHg, the estimated right ventricular systolic pressure is 29.9 mmHg. Left Atrium: Left atrial size was normal in size. Right Atrium: Right atrial size was normal in size. Pericardium: There is no evidence of pericardial effusion. Mitral Valve: The mitral valve is normal in structure. Mild mitral annular calcification. Trivial mitral valve regurgitation. No evidence of mitral valve stenosis. Tricuspid Valve: The tricuspid valve is normal in structure. Tricuspid valve regurgitation is trivial. No evidence of tricuspid stenosis. Aortic Valve: The aortic valve was not well visualized. Aortic valve regurgitation is not visualized. Mild to moderate aortic valve sclerosis/calcification is present, without any evidence of aortic stenosis. Pulmonic Valve: The pulmonic valve was normal in structure. Pulmonic valve regurgitation is not visualized. No evidence of pulmonic stenosis. Aorta: The aortic root is normal in size and structure. Venous: The inferior vena cava is normal in size with greater than 50% respiratory variability, suggesting right atrial pressure of 3 mmHg. IAS/Shunts: No atrial level shunt detected by color flow Doppler. Agitated saline contrast was given intravenously to evaluate for intracardiac shunting. Agitated saline contrast bubble study was negative, with no evidence of any interatrial shunt. There  is no evidence of a patent foramen ovale. There is no evidence of an atrial septal defect.  LEFT VENTRICLE PLAX 2D LVIDd:         3.60 cm  Diastology LVIDs:         2.40 cm  LV e' medial:    10.40  cm/s LV PW:         1.00 cm  LV E/e' medial:  7.4 LV IVS:        0.90 cm  LV e' lateral:   10.70 cm/s LVOT diam:     2.10 cm  LV E/e' lateral: 7.2 LVOT Area:     3.46 cm  RIGHT VENTRICLE             IVC RV S prime:     13.20 cm/s  IVC diam: 1.10 cm TAPSE (M-mode): 1.7 cm LEFT ATRIUM             Index       RIGHT ATRIUM          Index LA diam:        3.00 cm 1.87 cm/m  RA Area:     8.63 cm LA Vol (A2C):   26.5 ml 16.49 ml/m RA Volume:   15.70 ml 9.77 ml/m LA Vol (A4C):   26.9 ml 16.74 ml/m LA Biplane Vol: 26.5 ml 16.49 ml/m   AORTA Ao Asc  diam: 3.00 cm MITRAL VALVE               TRICUSPID VALVE MV Area (PHT): 3.53 cm    TR Peak grad:   22.8 mmHg MV Decel Time: 215 msec    TR Vmax:        239.00 cm/s MV E velocity: 77.10 cm/s MV A velocity: 78.00 cm/s  SHUNTS MV E/A ratio:  0.99        Systemic Diam: 2.10 cm Fransico Him MD Electronically signed by Fransico Him MD Signature Date/Time: 07/24/2020/4:09:20 PM    Final    VAS [place] LOWER EXTREMITY VENOUS (DVT)  Result Date: 07/27/2020  Lower Venous DVTStudy Indications: Stroke.  Comparison Study: no prior Performing Technologist: Abram Sander RVS  Examination Guidelines: A complete evaluation includes B-mode imaging, spectral Doppler, color Doppler, and power Doppler as needed of all accessible portions of each vessel. Bilateral testing is considered an integral part of a complete examination. Limited examinations for reoccurring indications may be performed as noted. The reflux portion of the exam is performed with the patient in reverse Trendelenburg.  +---------+---------------+---------+-----------+----------+--------------+ RIGHT    CompressibilityPhasicitySpontaneityPropertiesThrombus Aging +---------+---------------+---------+-----------+----------+--------------+ CFV      Full           Yes      Yes                                 +---------+---------------+---------+-----------+----------+--------------+ SFJ      Full                                                         +---------+---------------+---------+-----------+----------+--------------+ FV Prox  Full                                                        +---------+---------------+---------+-----------+----------+--------------+ FV Mid   Full                                                        +---------+---------------+---------+-----------+----------+--------------+ FV DistalFull                                                        +---------+---------------+---------+-----------+----------+--------------+ PFV      Full                                                        +---------+---------------+---------+-----------+----------+--------------+ POP      Full           Yes      Yes                                 +---------+---------------+---------+-----------+----------+--------------+  PTV      Full                                                        +---------+---------------+---------+-----------+----------+--------------+ PERO     Full                                                        +---------+---------------+---------+-----------+----------+--------------+   +---------+---------------+---------+-----------+----------+--------------+ LEFT     CompressibilityPhasicitySpontaneityPropertiesThrombus Aging +---------+---------------+---------+-----------+----------+--------------+ CFV      Full           Yes      Yes                                 +---------+---------------+---------+-----------+----------+--------------+ SFJ      Full                                                        +---------+---------------+---------+-----------+----------+--------------+ FV Prox  Full                                                        +---------+---------------+---------+-----------+----------+--------------+ FV Mid   Full                                                         +---------+---------------+---------+-----------+----------+--------------+ FV DistalFull                                                        +---------+---------------+---------+-----------+----------+--------------+ PFV      Full                                                        +---------+---------------+---------+-----------+----------+--------------+ POP      Full           Yes      Yes                                 +---------+---------------+---------+-----------+----------+--------------+ PTV      Full                                                        +---------+---------------+---------+-----------+----------+--------------+  PERO     Full                                                        +---------+---------------+---------+-----------+----------+--------------+     Summary: BILATERAL: - No evidence of deep vein thrombosis seen in the lower extremities, bilaterally. - No evidence of superficial venous thrombosis in the lower extremities, bilaterally. -   *See table(s) above for measurements and observations. Electronically signed by Curt Jews MD on 07/27/2020 at 5:07:19 PM.    Final      Subjective: No acute issues or events overnight, patient feels essentially back to baseline denies nausea, vomiting, diarrhea, constipation, headache, fevers, chills.  Her only complaint this morning was of hunger given she is n.p.o. for procedure.  Discharge Exam: Vitals:   07/26/20 1300 07/26/20 1356  BP: (!) 157/90 (!) 142/81  Pulse: 95 93  Resp: 16 17  Temp: 98.4 F (36.9 C) 98.2 F (36.8 C)  SpO2: 100% 99%   Vitals:   07/26/20 1230 07/26/20 1240 07/26/20 1300 07/26/20 1356  BP: 118/70 (!) 147/79 (!) 157/90 (!) 142/81  Pulse: 89 92 95 93  Resp: (!) 30 (!) 23 16 17   Temp:   98.4 F (36.9 C) 98.2 F (36.8 C)  TempSrc:   Oral Oral  SpO2: 100% 96% 100% 99%  Weight:      Height:        General:  Pleasantly resting in bed, No acute  distress. HEENT:  Normocephalic atraumatic.  Sclerae nonicteric, noninjected.  Extraocular movements intact bilaterally. Neck:  Without mass or deformity.  Trachea is midline. Lungs:  Clear to auscultate bilaterally without rhonchi, wheeze, or rales. Heart:  Regular rate and rhythm.  Without murmurs, rubs, or gallops. Abdomen:  Soft, nontender, nondistended.  Without guarding or rebound. Extremities: Without cyanosis, clubbing, edema, or obvious deformity. Vascular:  Dorsalis pedis and posterior tibial pulses palpable bilaterally. Skin:  Warm and dry, no erythema, no ulcerations.    The results of significant diagnostics from this hospitalization (including imaging, microbiology, ancillary and laboratory) are listed below for reference.     Microbiology: No results found for this or any previous visit (from the past 240 hour(s)).   Labs: BNP (last 3 results) No results for input(s): BNP in the last 8760 hours. Basic Metabolic Panel: No results for input(s): NA, K, CL, CO2, GLUCOSE, BUN, CREATININE, CALCIUM, MG, PHOS in the last 168 hours. Liver Function Tests: No results for input(s): AST, ALT, ALKPHOS, BILITOT, PROT, ALBUMIN in the last 168 hours. No results for input(s): LIPASE, AMYLASE in the last 168 hours. No results for input(s): AMMONIA in the last 168 hours. CBC: No results for input(s): WBC, NEUTROABS, HGB, HCT, MCV, PLT in the last 168 hours. Cardiac Enzymes: No results for input(s): CKTOTAL, CKMB, CKMBINDEX, TROPONINI in the last 168 hours. BNP: Invalid input(s): POCBNP CBG: No results for input(s): GLUCAP in the last 168 hours. D-Dimer No results for input(s): DDIMER in the last 72 hours. Hgb A1c No results for input(s): HGBA1C in the last 72 hours. Lipid Profile No results for input(s): CHOL, HDL, LDLCALC, TRIG, CHOLHDL, LDLDIRECT in the last 72 hours. Thyroid function studies No results for input(s): TSH, T4TOTAL, T3FREE, THYROIDAB in the last 72  hours.  Invalid input(s): FREET3 Anemia work up No results for input(s): VITAMINB12, FOLATE,  FERRITIN, TIBC, IRON, RETICCTPCT in the last 72 hours. Urinalysis    Component Value Date/Time   COLORURINE YELLOW 07/26/2020 [place]  07/26/2020 1327   LABSPEC 1.014 07/26/2020 1327   PHURINE 5.0 07/26/2020 1327   GLUCOSEU NEGATIVE 07/26/2020 1327   HGBUR NEGATIVE 07/26/2020 1327   BILIRUBINUR NEGATIVE 07/26/2020 1327   KETONESUR 20 (A) 07/26/2020 1327   PROTEINUR NEGATIVE 07/26/2020 1327   UROBILINOGEN 0.2 06/23/2013 1622   NITRITE NEGATIVE 07/26/2020 1327   LEUKOCYTESUR NEGATIVE 07/26/2020 1327   Sepsis Labs Invalid input(s): PROCALCITONIN,  WBC,  LACTICIDVEN Microbiology No results found for this or any previous visit (from the past 240 hour(s)). Time coordinating discharge: Over 30 minutes  SIGNED:  Little Ishikawa, DO [facility] 08/06/2020, 6:46 PM

## 2020-07-27 LAB — CARDIOLIPIN ANTIBODIES, IGG, IGM, IGA
Anticardiolipin IgA: <9 APL U/mL (ref 0–11)
Anticardiolipin IgG: <9 GPL U/mL (ref 0–14)
Anticardiolipin IgM: <9 MPL U/mL (ref 0–12)

## 2020-07-27 LAB — URINE CULTURE: Culture: NO GROWTH

## 2020-07-28 LAB — CULTURE, BLOOD (SINGLE)
Culture: NO GROWTH
Culture: NO GROWTH
Special Requests: ADEQUATE

## 2020-07-29 DIAGNOSIS — I10 Essential (primary) hypertension: Secondary | ICD-10-CM | POA: Diagnosis not present

## 2020-07-29 DIAGNOSIS — I639 Cerebral infarction, unspecified: Secondary | ICD-10-CM | POA: Diagnosis not present

## 2020-07-29 DIAGNOSIS — R634 Abnormal weight loss: Secondary | ICD-10-CM | POA: Diagnosis not present

## 2020-07-29 DIAGNOSIS — Z23 Encounter for immunization: Secondary | ICD-10-CM | POA: Diagnosis not present

## 2020-07-29 DIAGNOSIS — J189 Pneumonia, unspecified organism: Secondary | ICD-10-CM | POA: Diagnosis not present

## 2020-07-29 DIAGNOSIS — E43 Unspecified severe protein-calorie malnutrition: Secondary | ICD-10-CM | POA: Diagnosis not present

## 2020-07-29 LAB — METHYLMALONIC ACID, SERUM: Methylmalonic Acid, Quantitative: 190 nmol/L (ref 0–378)

## 2020-08-03 LAB — VITAMIN B1: Vitamin B1 (Thiamine): 75.8 nmol/L (ref 66.5–200.0)

## 2020-08-03 NOTE — Progress Notes (Signed)
Kindly inform the patient that ultrasound study of the lower extremities veins was negative and did not show any evidence of blood clot

## 2020-08-03 NOTE — Progress Notes (Signed)
Kindly inform the patient that lab work for abnormal clotting was unremarkable

## 2020-08-04 ENCOUNTER — Telehealth: Payer: Self-pay

## 2020-08-04 NOTE — Telephone Encounter (Signed)
Pt verified by name and DOB, results given per provider, pt voiced understanding all question answered. 

## 2020-08-04 NOTE — Progress Notes (Signed)
Cardiology Office Note Date:  08/04/2020  Patient ID:  [name], Alferd Apa 03/31/1961, MRN [id number] PCP:  London Pepper, MD  Cardiologist/Electrophysiologist: new last hospital stay, Dr. Lovena Le     Chief Complaint: hospital follow up  History of Present Illness: [name] is a 59 y.o. female with history of stroke, (unknown etiology, possible fibromuscular dysplasia, Dr. 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 The patient though wanted to hold off on any monitoring, especially loop implant, feeling a bit overwhelmed and wanting to "take things slow" Noted baseline HR 100's, she was also being treated for pneumonia. Described as having severe protein-calorie malnutrition Planned to follow up outpt.  NOTE: they were unable to get TEE completed unable to pass probe and recommended GI evaluation prior to consideration for retry  LE venous US was neg for DVT hypercoag labs reported by dr. Leonie Man as unremarkable  TODAY She comes accompanied by her husband today. She speaks quietly and seems to defer to him a lot, but does answer my questions when asked. He feels like she has done great getting back to her baseline nearly.  Mostly he rports that her speech was quite slow and she was confused at seemed, but this is nearly resolve. She makes the bed and while walking slow gets around and does plenty of stuff around the house. She denies any CP, palpitations or cardiac awareness.  She has never taken notie of her pulse or been tolde her heart beat is fats. She denies any SOB or DOE No dizzy spells, near syncope or syncope.  They report they are working on better eating, eating foods with higher calorie and protien, she has  seen her PMD and has f/u in a month.  There was no discussion on GI evaluation, but discussed adjustment in eating habits mostly.  Past Medical History:  Diagnosis Date  . 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 Cerebrovascular accident (stroke) ([place]) 2005  . Dental caries 01/27/2008  . GERD (gastroesophageal reflux disease) 11/27/2006  . 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 Hypertension 11/23/2006    Past Surgical History:  Procedure Laterality Date  . CESAREAN SECTION    . cyst removed from right side  yrs ago  . EUS N/A 01/07/2014   Procedure: ESOPHAGEAL ENDOSCOPIC ULTRASOUND (EUS) RADIAL;  Surgeon: Arta Silence, MD;  Location: [facility] ENDOSCOPY;  Service: Endoscopy;  Laterality: N/A;  . FINE NEEDLE ASPIRATION N/A 01/07/2014   Procedure: FINE NEEDLE ASPIRATION (FNA) LINEAR;  Surgeon: Arta Silence, MD;  Location: [facility] ENDOSCOPY;  Service: Endoscopy;  Laterality: N/A;  . TUBAL LIGATION  yrs ago    Current Outpatient Medications  Medication Sig Dispense Refill  . acetaminophen (TYLENOL) 500 MG tablet Take 500 mg by mouth every 6 (six) hours as needed for moderate pain or headache.    Marland Kitchen aspirin EC 81 MG tablet Take 81 mg by mouth daily. Swallow whole.    Marland Kitchen atorvastatin (LIPITOR) 80 MG tablet Take 1 tablet (80 mg total) by mouth daily. 30 tablet 0  . Calcium Carbonate Antacid (TUMS E-X PO) Take 1 tablet by mouth as needed.    . clopidogrel (PLAVIX) 75 MG tablet Take 1 tablet (75 mg total) by mouth  daily. 30 tablet 0  . lisinopril (PRINIVIL,ZESTRIL) 20 MG tablet Take 20 mg by mouth every morning.    . pantoprazole (PROTONIX) 40 MG tablet Take 40 mg by mouth daily.     No current facility-administered medications for this visit.    Allergies:   Patient has no known allergies.   Social History:  The patient  reports that she quit smoking about 6 years ago. Her smoking use included cigarettes. She has a 5.00 pack-year  smoking history. She has never used smokeless tobacco. She reports current alcohol use. She reports that she does not use drugs.   Family History:  The patient's family history includes Hypertension in her daughter and mother.  ROS:  Please see the history of present illness.    All other systems are reviewed and otherwise negative.   PHYSICAL EXAM:  VS:  There were no vitals taken for this visit. BMI: There is no height or weight on file to calculate BMI. Well nourished, well developed, in no acute distress, appears chronically ill, and older then her age 26: normocephalic, atraumatic Neck: no JVD, carotid bruits or masses Cardiac:  RRR;  tachycardicno significant murmurs, no rubs, or gallops Lungs:   CTA b/l, no wheezing, rhonchi or rales Abd: soft, nontender MS: no deformity, she has marked/advanced atrophy Ext: no edema Skin: warm and dry, no rash Neuro:  No gross deficits appreciated Psych: euthymic mood, full affect     EKG:  Not done today No Afib in review of Epic EKGs, tachycardic though 123 and 143 bpm, sinus   07/24/2020: TTE IMPRESSIONS  1. Left ventricular ejection fraction, by estimation, is 60 to 65%.The  left ventricle has normal function. The left ventricle has no regional  wall motion abnormalities. Left ventricular diastolic parameters were  normal.  2. Right ventricular systolic function is normal. The right ventricular  size is normal. There is normal pulmonary artery systolic pressure.  3. The mitral valve is normal in structure. Trivial mitral valve  regurgitation. No evidence of mitral stenosis.  4. The aortic valve was not well visualized. Aortic valve regurgitation  is not visualized. Mild to moderate aortic valve sclerosis/calcification  is present, without any evidence of aortic stenosis.  5. The inferior vena cava is normal in size with greater than 50%  respiratory variability, suggesting right atrial pressure of 3 mmHg.  6. Agitated  saline contrast bubble study was negative, with no evidence  of any interatrial shunt.    Recent Labs: 07/23/2020: TSH 0.613 07/24/2020: Magnesium 2.3 07/26/2020: ALT 8; BUN <5; Creatinine, Ser 0.67; Hemoglobin 8.5; Platelets 468; Potassium 3.5; Sodium 135  07/23/2020: Cholesterol 119; HDL 59; LDL Cholesterol 41; Total CHOL/HDL Ratio 2.0; Triglycerides 95; VLDL 19   Estimated Creatinine Clearance: 55.7 mL/min (by C-G formula based on SCr of 0.67 mg/dL).   Wt Readings from Last 3 Encounters:  07/25/20 124 lb 9 oz (56.5 kg)  01/07/14 132 lb (59.9 kg)     Other studies reviewed: Additional studies/records reviewed today include: summarized above  ASSESSMENT AND PLAN:  1. Cryptogenic stroke     discussed with the patient and her husband rational for heart rhythm monitoring in the environment of cryptogenic stroke.     Afib surveillance     They were unable to do her TEE, unable to pass the probe, her TTE had negative bubble study  Recommend seeing her PMD again to discuss getting GI evaluation and were given a copy of her attempted TEE study report. We will  start with 30 day monitor and have her see Dr. Lovena Le in 6-8 weeks to revisit perhaps loop implant Hopefully by then she will have GI w/u in progress or completed. And can decide if she still needs TEE or not       2. Sinus tachycardia     Resting her here by pulse ox 110-112     Unremarkable TTE     No sensation of palpitations or symptoms     ? 2/2 malnutrition     Notice that her HR in the hospital were similar     Wait for her monitor results    Disposition:  as above  Current medicines are reviewed at length with the patient today.  The patient did not have any concerns regarding medicines.  Venetia Night, PA-C 08/04/2020 9:04 AM     [place] [id number] [phone] (office)  [phone] (fax)

## 2020-08-05 ENCOUNTER — Ambulatory Visit (INDEPENDENT_AMBULATORY_CARE_PROVIDER_SITE_OTHER): Payer: BC Managed Care – PPO | Admitting: Physician Assistant

## 2020-08-05 ENCOUNTER — Other Ambulatory Visit: Payer: Self-pay

## 2020-08-05 ENCOUNTER — Telehealth: Payer: Self-pay | Admitting: Radiology

## 2020-08-05 VITALS — BP 132/90 | HR 115 | Ht 61.0 in | Wt 82.0 lb

## 2020-08-05 DIAGNOSIS — I639 Cerebral infarction, unspecified: Secondary | ICD-10-CM

## 2020-08-05 DIAGNOSIS — R000 Tachycardia, unspecified: Secondary | ICD-10-CM

## 2020-08-05 NOTE — Patient Instructions (Addendum)
Medication Instructions:   Your physician recommends that you continue on your current medications as directed. Please refer to the Current Medication list given to you today.  *If you need a refill on your cardiac medications before your next appointment, please call your pharmacy*   Lab Work: [place]   If you have labs (blood work) drawn today and your tests are completely normal, you will receive your results only by: Marland Kitchen MyChart Message (if you have MyChart) OR . A paper copy in the mail If you have any lab test that is abnormal or we need to change your treatment, we will call you to review the results.   Testing/Procedures: Your physician has recommended that you wear an event monitor. Event monitors are medical devices that record the heart's electrical activity. Doctors most often [place] these monitors to diagnose arrhythmias. Arrhythmias are problems with the speed or rhythm of the heartbeat. The monitor is a small, portable device. You can wear one while you do your normal daily activities. This is usually used to diagnose what is causing palpitations/syncope (passing out).    Follow-Up: At [facility], you and your health needs are our priority.  As part of our continuing mission to provide you with exceptional heart care, we have created designated Provider Care Teams.  These Care Teams include your primary Cardiologist (physician) and Advanced Practice Providers (APPs -  Physician Assistants and Nurse Practitioners) who all work together to provide you with the care you need, when you need it.  We recommend signing up for the patient portal called "MyChart".  Sign up information is provided on this After Visit Summary.  MyChart is used to connect with patients for Virtual Visits (Telemedicine).  Patients are able to view lab/test results, encounter notes, upcoming appointments, etc.  Non-urgent messages can be sent to your provider as well.   To learn more about what you  can do with MyChart, go to [email or web address].    Your next appointment:   6-8 week(s)  The format for your next appointment:   In Person  Provider:    You may see Dr. Lovena Le   YOU HAVE BEEN RECCOMMENDED TO FOLLOW UP WITH GASTROENTEROLOGIST FOR EVALUATION FOR EATING   Other Instructions   [employer] Cardiac Event Monitor Instructions Your physician has requested you wear your cardiac event monitor for _30____ days, (1-30). [employer] may call or text to confirm a shipping address. The monitor will be sent to a land address via UPS. [employer] will not ship a monitor to a PO BOX. It typically takes 3-5 days to receive your monitor after it has been enrolled. [employer] will assist with USPS tracking if your package is delayed. The telephone number for [employer] is [phone]. Once you have received your monitor, please review the enclosed instructions. Instruction tutorials can also be viewed under help and settings on the enclosed cell phone. Your monitor has already been registered assigning a specific monitor serial # to you.  Applying the monitor Remove cell phone from case and turn it on. The cell phone works as Dealer and needs to be within [employer] of you at all times. The cell phone will need to be charged on a daily basis. We recommend you plug the cell phone into the enclosed charger at your bedside table every night.  Monitor batteries: You will receive two monitor batteries labelled #1 and #2. These are your recorders. Plug battery #2 onto the second connection on the enclosed charger. Keep  one battery on the charger at all times. This will keep the monitor battery deactivated. It will also keep it fully charged for when you need to switch your monitor batteries. A small light will be blinking on the battery emblem when it is charging. The light on the battery emblem will remain on when the battery is fully charged.  Open package of a Monitor strip.  Insert battery #1 into black hood on strip and gently squeeze monitor battery onto connection as indicated in instruction booklet. Set aside while preparing skin.  Choose location for your strip, vertical or horizontal, as indicated in the instruction booklet. Shave to remove all hair from location. There cannot be any lotions, oils, powders, or colognes on skin where monitor is to be applied. Wipe skin clean with enclosed Saline wipe. Dry skin completely.  Peel paper labeled #1 off the back of the Monitor strip exposing the adhesive. Place the monitor on the chest in the vertical or horizontal position shown in the instruction booklet. One arrow on the monitor strip must be pointing upward. Carefully remove paper labeled #2, attaching remainder of strip to your skin. Try not to create any folds or wrinkles in the strip as you apply it.  Firmly press and release the circle in the center of the monitor battery. You will hear a small beep. This is turning the monitor battery on. The heart emblem on the monitor battery will light up every 5 seconds if the monitor battery in turned on and connected to the patient securely. Do not push and hold the circle down as this turns the monitor battery off. The cell phone will locate the monitor battery. A screen will appear on the cell phone checking the connection of your monitor strip. This may read poor connection initially but change to good connection within the next minute. Once your monitor accepts the connection you will hear a series of 3 beeps followed by a climbing crescendo of beeps. A screen will appear on the cell phone showing the two monitor strip placement options. Touch the picture that demonstrates where you applied the monitor strip.  Your monitor strip and battery are waterproof. You are able to shower, bathe, or swim with the monitor on. They just ask you do not submerge deeper than 3 feet underwater. We recommend removing the monitor  if you are swimming in a lake, river, or ocean.  Your monitor battery will need to be switched to a fully charged monitor battery approximately once a week. The cell phone will alert you of an action which needs to be made.  On the cell phone, tap for details to reveal connection status, monitor battery status, and cell phone battery status. The green dots indicates your monitor is in good status. A red dot indicates there is something that needs your attention.  To record a symptom, click the circle on the monitor battery. In 30-60 seconds a list of symptoms will appear on the cell phone. Select your symptom and tap save. Your monitor will record a sustained or significant arrhythmia regardless of you clicking the button. Some patients do not feel the heart rhythm irregularities. [employer] will notify us of any serious or critical events.  Refer to instruction booklet for instructions on switching batteries, changing strips, the Do not disturb or Pause features, or any additional questions.  Call [employer] at [phone], to confirm your monitor is transmitting and record your baseline. They will answer any questions you may have regarding the monitor  instructions at that time.  Returning the monitor to [place] all equipment back into blue box. Peel off strip of paper to expose adhesive and close box securely. There is a prepaid UPS shipping label on this box. Drop in a UPS drop box, or at a UPS facility like [employer]. You may also contact [employer] to arrange UPS to pick up monitor package at your home.

## 2020-08-05 NOTE — Telephone Encounter (Signed)
Enrolled patient for a 30 day [employer] Event Monitor to be mailed to patients home  

## 2020-08-06 DIAGNOSIS — A419 Sepsis, unspecified organism: Secondary | ICD-10-CM

## 2020-08-07 DIAGNOSIS — K859 Acute pancreatitis without necrosis or infection, unspecified: Secondary | ICD-10-CM | POA: Diagnosis not present

## 2020-08-07 DIAGNOSIS — Z9181 History of falling: Secondary | ICD-10-CM | POA: Diagnosis not present

## 2020-08-07 DIAGNOSIS — Z7982 Long term (current) use of aspirin: Secondary | ICD-10-CM | POA: Diagnosis not present

## 2020-08-07 DIAGNOSIS — H409 Unspecified glaucoma: Secondary | ICD-10-CM | POA: Diagnosis not present

## 2020-08-07 DIAGNOSIS — K862 Cyst of pancreas: Secondary | ICD-10-CM | POA: Diagnosis not present

## 2020-08-07 DIAGNOSIS — D509 Iron deficiency anemia, unspecified: Secondary | ICD-10-CM | POA: Diagnosis not present

## 2020-08-07 DIAGNOSIS — E43 Unspecified severe protein-calorie malnutrition: Secondary | ICD-10-CM | POA: Diagnosis not present

## 2020-08-07 DIAGNOSIS — I10 Essential (primary) hypertension: Secondary | ICD-10-CM | POA: Diagnosis not present

## 2020-08-07 DIAGNOSIS — I69354 Hemiplegia and hemiparesis following cerebral infarction affecting left non-dominant side: Secondary | ICD-10-CM | POA: Diagnosis not present

## 2020-08-07 DIAGNOSIS — Z87891 Personal history of nicotine dependence: Secondary | ICD-10-CM | POA: Diagnosis not present

## 2020-08-07 DIAGNOSIS — Z8701 Personal history of pneumonia (recurrent): Secondary | ICD-10-CM | POA: Diagnosis not present

## 2020-08-07 DIAGNOSIS — Z7902 Long term (current) use of antithrombotics/antiplatelets: Secondary | ICD-10-CM | POA: Diagnosis not present

## 2020-08-07 DIAGNOSIS — D62 Acute posthemorrhagic anemia: Secondary | ICD-10-CM | POA: Diagnosis not present

## 2020-08-07 DIAGNOSIS — K219 Gastro-esophageal reflux disease without esophagitis: Secondary | ICD-10-CM | POA: Diagnosis not present

## 2020-08-09 ENCOUNTER — Inpatient Hospital Stay (HOSPITAL_COMMUNITY)
Admission: EM | Admit: 2020-08-09 | Discharge: 2020-08-17 | DRG: 871 | Disposition: A | Discharge status: Hospice | Payer: BC Managed Care – PPO | Attending: Internal Medicine | Admitting: Internal Medicine

## 2020-08-09 ENCOUNTER — Encounter (HOSPITAL_COMMUNITY): Payer: Self-pay | Admitting: Emergency Medicine

## 2020-08-09 DIAGNOSIS — R630 Anorexia: Secondary | ICD-10-CM | POA: Diagnosis present

## 2020-08-09 DIAGNOSIS — R5381 Other malaise: Secondary | ICD-10-CM | POA: Diagnosis not present

## 2020-08-09 DIAGNOSIS — Z79899 Other long term (current) drug therapy: Secondary | ICD-10-CM

## 2020-08-09 DIAGNOSIS — Z515 Encounter for palliative care: Secondary | ICD-10-CM | POA: Diagnosis not present

## 2020-08-09 DIAGNOSIS — D62 Acute posthemorrhagic anemia: Secondary | ICD-10-CM | POA: Diagnosis not present

## 2020-08-09 DIAGNOSIS — Z66 Do not resuscitate: Secondary | ICD-10-CM | POA: Diagnosis not present

## 2020-08-09 DIAGNOSIS — E785 Hyperlipidemia, unspecified: Secondary | ICD-10-CM | POA: Diagnosis not present

## 2020-08-09 DIAGNOSIS — Z7401 Bed confinement status: Secondary | ICD-10-CM | POA: Diagnosis not present

## 2020-08-09 DIAGNOSIS — C342 Malignant neoplasm of middle lobe, bronchus or lung: Secondary | ICD-10-CM | POA: Diagnosis not present

## 2020-08-09 DIAGNOSIS — Z681 Body mass index (BMI) 19 or less, adult: Secondary | ICD-10-CM | POA: Diagnosis not present

## 2020-08-09 DIAGNOSIS — R54 Age-related physical debility: Secondary | ICD-10-CM | POA: Diagnosis present

## 2020-08-09 DIAGNOSIS — E876 Hypokalemia: Secondary | ICD-10-CM | POA: Diagnosis not present

## 2020-08-09 DIAGNOSIS — E870 Hyperosmolality and hypernatremia: Secondary | ICD-10-CM | POA: Diagnosis present

## 2020-08-09 DIAGNOSIS — R159 Full incontinence of feces: Secondary | ICD-10-CM | POA: Diagnosis not present

## 2020-08-09 DIAGNOSIS — J189 Pneumonia, unspecified organism: Secondary | ICD-10-CM | POA: Diagnosis present

## 2020-08-09 DIAGNOSIS — Z20822 Contact with and (suspected) exposure to covid-19: Secondary | ICD-10-CM | POA: Diagnosis present

## 2020-08-09 DIAGNOSIS — E872 Acidosis: Secondary | ICD-10-CM | POA: Diagnosis present

## 2020-08-09 DIAGNOSIS — R64 Cachexia: Secondary | ICD-10-CM | POA: Diagnosis not present

## 2020-08-09 DIAGNOSIS — Z7982 Long term (current) use of aspirin: Secondary | ICD-10-CM

## 2020-08-09 DIAGNOSIS — J440 Chronic obstructive pulmonary disease with acute lower respiratory infection: Secondary | ICD-10-CM | POA: Diagnosis present

## 2020-08-09 DIAGNOSIS — J984 Other disorders of lung: Secondary | ICD-10-CM | POA: Diagnosis not present

## 2020-08-09 DIAGNOSIS — A419 Sepsis, unspecified organism: Secondary | ICD-10-CM | POA: Diagnosis not present

## 2020-08-09 DIAGNOSIS — E43 Unspecified severe protein-calorie malnutrition: Secondary | ICD-10-CM | POA: Diagnosis present

## 2020-08-09 DIAGNOSIS — I10 Essential (primary) hypertension: Secondary | ICD-10-CM | POA: Diagnosis not present

## 2020-08-09 DIAGNOSIS — E7210 Disorders of sulfur-bearing amino-acid metabolism, unspecified: Secondary | ICD-10-CM | POA: Diagnosis not present

## 2020-08-09 DIAGNOSIS — R32 Unspecified urinary incontinence: Secondary | ICD-10-CM | POA: Diagnosis not present

## 2020-08-09 DIAGNOSIS — K219 Gastro-esophageal reflux disease without esophagitis: Secondary | ICD-10-CM | POA: Diagnosis not present

## 2020-08-09 DIAGNOSIS — Z7189 Other specified counseling: Secondary | ICD-10-CM | POA: Diagnosis not present

## 2020-08-09 DIAGNOSIS — R918 Other nonspecific abnormal finding of lung field: Secondary | ICD-10-CM | POA: Diagnosis not present

## 2020-08-09 DIAGNOSIS — Z741 Need for assistance with personal care: Secondary | ICD-10-CM | POA: Diagnosis not present

## 2020-08-09 DIAGNOSIS — E7211 Homocystinuria: Secondary | ICD-10-CM | POA: Diagnosis not present

## 2020-08-09 DIAGNOSIS — K859 Acute pancreatitis without necrosis or infection, unspecified: Secondary | ICD-10-CM | POA: Diagnosis not present

## 2020-08-09 DIAGNOSIS — D509 Iron deficiency anemia, unspecified: Secondary | ICD-10-CM | POA: Diagnosis not present

## 2020-08-09 DIAGNOSIS — Z7902 Long term (current) use of antithrombotics/antiplatelets: Secondary | ICD-10-CM

## 2020-08-09 DIAGNOSIS — Z8249 Family history of ischemic heart disease and other diseases of the circulatory system: Secondary | ICD-10-CM | POA: Diagnosis not present

## 2020-08-09 DIAGNOSIS — M2550 Pain in unspecified joint: Secondary | ICD-10-CM | POA: Diagnosis not present

## 2020-08-09 DIAGNOSIS — R4702 Dysphasia: Secondary | ICD-10-CM | POA: Diagnosis present

## 2020-08-09 DIAGNOSIS — I69318 Other symptoms and signs involving cognitive functions following cerebral infarction: Secondary | ICD-10-CM | POA: Diagnosis not present

## 2020-08-09 DIAGNOSIS — R000 Tachycardia, unspecified: Secondary | ICD-10-CM | POA: Diagnosis not present

## 2020-08-09 DIAGNOSIS — G9341 Metabolic encephalopathy: Secondary | ICD-10-CM | POA: Diagnosis not present

## 2020-08-09 DIAGNOSIS — E8809 Other disorders of plasma-protein metabolism, not elsewhere classified: Secondary | ICD-10-CM | POA: Diagnosis present

## 2020-08-09 DIAGNOSIS — Z87891 Personal history of nicotine dependence: Secondary | ICD-10-CM

## 2020-08-09 DIAGNOSIS — R4182 Altered mental status, unspecified: Secondary | ICD-10-CM | POA: Diagnosis not present

## 2020-08-09 DIAGNOSIS — R627 Adult failure to thrive: Secondary | ICD-10-CM | POA: Diagnosis present

## 2020-08-09 DIAGNOSIS — I639 Cerebral infarction, unspecified: Secondary | ICD-10-CM | POA: Diagnosis present

## 2020-08-09 DIAGNOSIS — R0902 Hypoxemia: Secondary | ICD-10-CM | POA: Diagnosis not present

## 2020-08-09 DIAGNOSIS — I69320 Aphasia following cerebral infarction: Secondary | ICD-10-CM | POA: Diagnosis not present

## 2020-08-09 DIAGNOSIS — J90 Pleural effusion, not elsewhere classified: Secondary | ICD-10-CM | POA: Diagnosis not present

## 2020-08-09 DIAGNOSIS — E860 Dehydration: Secondary | ICD-10-CM | POA: Diagnosis present

## 2020-08-09 DIAGNOSIS — I69354 Hemiplegia and hemiparesis following cerebral infarction affecting left non-dominant side: Secondary | ICD-10-CM | POA: Diagnosis not present

## 2020-08-09 DIAGNOSIS — W19XXXA Unspecified fall, initial encounter: Secondary | ICD-10-CM | POA: Diagnosis not present

## 2020-08-09 NOTE — ED Notes (Signed)
Kerman Husband would like an update

## 2020-08-09 NOTE — ED Triage Notes (Signed)
Pt  Here from home with c/o FTT pt has had 2 stroke in the past and family states that she not eating as much and weaker

## 2020-08-10 ENCOUNTER — Emergency Department (HOSPITAL_COMMUNITY): Payer: BC Managed Care – PPO

## 2020-08-10 DIAGNOSIS — R64 Cachexia: Secondary | ICD-10-CM | POA: Diagnosis present

## 2020-08-10 DIAGNOSIS — J984 Other disorders of lung: Secondary | ICD-10-CM | POA: Diagnosis not present

## 2020-08-10 DIAGNOSIS — R918 Other nonspecific abnormal finding of lung field: Secondary | ICD-10-CM | POA: Diagnosis not present

## 2020-08-10 DIAGNOSIS — R54 Age-related physical debility: Secondary | ICD-10-CM | POA: Diagnosis present

## 2020-08-10 DIAGNOSIS — Z741 Need for assistance with personal care: Secondary | ICD-10-CM | POA: Diagnosis not present

## 2020-08-10 DIAGNOSIS — I69320 Aphasia following cerebral infarction: Secondary | ICD-10-CM | POA: Diagnosis not present

## 2020-08-10 DIAGNOSIS — E7211 Homocystinuria: Secondary | ICD-10-CM | POA: Diagnosis not present

## 2020-08-10 DIAGNOSIS — E7210 Disorders of sulfur-bearing amino-acid metabolism, unspecified: Secondary | ICD-10-CM | POA: Diagnosis present

## 2020-08-10 DIAGNOSIS — Z8249 Family history of ischemic heart disease and other diseases of the circulatory system: Secondary | ICD-10-CM | POA: Diagnosis not present

## 2020-08-10 DIAGNOSIS — A419 Sepsis, unspecified organism: Secondary | ICD-10-CM | POA: Diagnosis present

## 2020-08-10 DIAGNOSIS — Z87891 Personal history of nicotine dependence: Secondary | ICD-10-CM | POA: Diagnosis not present

## 2020-08-10 DIAGNOSIS — Z7189 Other specified counseling: Secondary | ICD-10-CM | POA: Diagnosis not present

## 2020-08-10 DIAGNOSIS — R159 Full incontinence of feces: Secondary | ICD-10-CM | POA: Diagnosis not present

## 2020-08-10 DIAGNOSIS — E785 Hyperlipidemia, unspecified: Secondary | ICD-10-CM | POA: Diagnosis not present

## 2020-08-10 DIAGNOSIS — R4182 Altered mental status, unspecified: Secondary | ICD-10-CM | POA: Diagnosis present

## 2020-08-10 DIAGNOSIS — C342 Malignant neoplasm of middle lobe, bronchus or lung: Secondary | ICD-10-CM | POA: Diagnosis present

## 2020-08-10 DIAGNOSIS — Z7982 Long term (current) use of aspirin: Secondary | ICD-10-CM | POA: Diagnosis not present

## 2020-08-10 DIAGNOSIS — J189 Pneumonia, unspecified organism: Secondary | ICD-10-CM | POA: Diagnosis not present

## 2020-08-10 DIAGNOSIS — I69318 Other symptoms and signs involving cognitive functions following cerebral infarction: Secondary | ICD-10-CM | POA: Diagnosis not present

## 2020-08-10 DIAGNOSIS — Z7902 Long term (current) use of antithrombotics/antiplatelets: Secondary | ICD-10-CM | POA: Diagnosis not present

## 2020-08-10 DIAGNOSIS — E876 Hypokalemia: Secondary | ICD-10-CM | POA: Diagnosis not present

## 2020-08-10 DIAGNOSIS — Z515 Encounter for palliative care: Secondary | ICD-10-CM | POA: Diagnosis not present

## 2020-08-10 DIAGNOSIS — I639 Cerebral infarction, unspecified: Secondary | ICD-10-CM | POA: Diagnosis not present

## 2020-08-10 DIAGNOSIS — I10 Essential (primary) hypertension: Secondary | ICD-10-CM | POA: Diagnosis not present

## 2020-08-10 DIAGNOSIS — Z20822 Contact with and (suspected) exposure to covid-19: Secondary | ICD-10-CM | POA: Diagnosis present

## 2020-08-10 DIAGNOSIS — R630 Anorexia: Secondary | ICD-10-CM | POA: Diagnosis not present

## 2020-08-10 DIAGNOSIS — E870 Hyperosmolality and hypernatremia: Secondary | ICD-10-CM | POA: Diagnosis present

## 2020-08-10 DIAGNOSIS — Z79899 Other long term (current) drug therapy: Secondary | ICD-10-CM | POA: Diagnosis not present

## 2020-08-10 DIAGNOSIS — M2550 Pain in unspecified joint: Secondary | ICD-10-CM | POA: Diagnosis not present

## 2020-08-10 DIAGNOSIS — Z7401 Bed confinement status: Secondary | ICD-10-CM | POA: Diagnosis not present

## 2020-08-10 DIAGNOSIS — G9341 Metabolic encephalopathy: Secondary | ICD-10-CM | POA: Diagnosis not present

## 2020-08-10 DIAGNOSIS — J440 Chronic obstructive pulmonary disease with acute lower respiratory infection: Secondary | ICD-10-CM | POA: Diagnosis present

## 2020-08-10 DIAGNOSIS — Z681 Body mass index (BMI) 19 or less, adult: Secondary | ICD-10-CM | POA: Diagnosis not present

## 2020-08-10 DIAGNOSIS — R32 Unspecified urinary incontinence: Secondary | ICD-10-CM | POA: Diagnosis not present

## 2020-08-10 DIAGNOSIS — E43 Unspecified severe protein-calorie malnutrition: Secondary | ICD-10-CM | POA: Diagnosis not present

## 2020-08-10 DIAGNOSIS — Z66 Do not resuscitate: Secondary | ICD-10-CM | POA: Diagnosis not present

## 2020-08-10 DIAGNOSIS — E872 Acidosis: Secondary | ICD-10-CM | POA: Diagnosis present

## 2020-08-10 DIAGNOSIS — K219 Gastro-esophageal reflux disease without esophagitis: Secondary | ICD-10-CM | POA: Diagnosis present

## 2020-08-10 LAB — URINALYSIS, ROUTINE W REFLEX MICROSCOPIC
Bacteria, UA: NONE SEEN
Bilirubin Urine: NEGATIVE
Glucose, UA: NEGATIVE mg/dL
Hgb urine dipstick: NEGATIVE
Ketones, ur: 5 mg/dL — AB
Leukocytes,Ua: NEGATIVE
Nitrite: NEGATIVE
Protein, ur: 100 mg/dL — AB
Specific Gravity, Urine: 1.025 (ref 1.005–1.030)
pH: 5 (ref 5.0–8.0)

## 2020-08-10 LAB — COMPREHENSIVE METABOLIC PANEL
ALT: 17 U/L (ref 0–44)
AST: 30 U/L (ref 15–41)
Albumin: 1.9 g/dL — ABNORMAL LOW (ref 3.5–5.0)
Alkaline Phosphatase: 89 U/L (ref 38–126)
Anion gap: 16 — ABNORMAL HIGH (ref 5–15)
BUN: 39 mg/dL — ABNORMAL HIGH (ref 8–23)
CO2: 32 mmol/L (ref 22–32)
Calcium: 10.3 mg/dL (ref 8.9–10.3)
Chloride: 104 mmol/L (ref 98–111)
Creatinine, Ser: 0.8 mg/dL (ref 0.44–1.00)
GFR calc Af Amer: >60 mL/min (ref >60)
GFR calc non Af Amer: >60 mL/min (ref >60)
Glucose, Bld: 143 mg/dL — ABNORMAL HIGH (ref 70–99)
Potassium: 2.5 mmol/L — CL (ref 3.5–5.1)
Sodium: 152 mmol/L — ABNORMAL HIGH (ref 135–145)
Total Bilirubin: 0.8 mg/dL (ref 0.3–1.2)
Total Protein: 7.9 g/dL (ref 6.5–8.1)

## 2020-08-10 LAB — BASIC METABOLIC PANEL
Anion gap: 14 (ref 5–15)
BUN: 37 mg/dL — ABNORMAL HIGH (ref 8–23)
CO2: 26 mmol/L (ref 22–32)
Calcium: 8.9 mg/dL (ref 8.9–10.3)
Chloride: 110 mmol/L (ref 98–111)
Creatinine, Ser: 0.84 mg/dL (ref 0.44–1.00)
GFR calc Af Amer: >60 mL/min (ref >60)
GFR calc non Af Amer: >60 mL/min (ref >60)
Glucose, Bld: 156 mg/dL — ABNORMAL HIGH (ref 70–99)
Potassium: 4.2 mmol/L (ref 3.5–5.1)
Sodium: 150 mmol/L — ABNORMAL HIGH (ref 135–145)

## 2020-08-10 LAB — HIV ANTIBODY (ROUTINE TESTING W REFLEX): HIV Screen 4th Generation wRfx: NONREACTIVE

## 2020-08-10 LAB — CBC WITH DIFFERENTIAL/PLATELET
Abs Immature Granulocytes: 0.12 10*3/uL — ABNORMAL HIGH (ref 0.00–0.07)
Basophils Absolute: 0 10*3/uL (ref 0.0–0.1)
Basophils Relative: 0 %
Eosinophils Absolute: 0 10*3/uL (ref 0.0–0.5)
Eosinophils Relative: 0 %
HCT: 40 % (ref 36.0–46.0)
Hemoglobin: 11.9 g/dL — ABNORMAL LOW (ref 12.0–15.0)
Immature Granulocytes: 1 %
Lymphocytes Relative: 14 %
Lymphs Abs: 2.2 10*3/uL (ref 0.7–4.0)
MCH: 24.9 pg — ABNORMAL LOW (ref 26.0–34.0)
MCHC: 29.8 g/dL — ABNORMAL LOW (ref 30.0–36.0)
MCV: 83.7 fL (ref 80.0–100.0)
Monocytes Absolute: 1.5 10*3/uL — ABNORMAL HIGH (ref 0.1–1.0)
Monocytes Relative: 9 %
Neutro Abs: 12.3 10*3/uL — ABNORMAL HIGH (ref 1.7–7.7)
Neutrophils Relative %: 76 %
Platelets: 470 10*3/uL — ABNORMAL HIGH (ref 150–400)
RBC: 4.78 MIL/uL (ref 3.87–5.11)
RDW: 18.8 % — ABNORMAL HIGH (ref 11.5–15.5)
WBC: 16.1 10*3/uL — ABNORMAL HIGH (ref 4.0–10.5)
nRBC: 0 % (ref 0.0–0.2)

## 2020-08-10 LAB — CBG MONITORING, ED: Glucose-Capillary: 143 mg/dL — ABNORMAL HIGH (ref 70–99)

## 2020-08-10 LAB — RESPIRATORY PANEL BY RT PCR (FLU A&B, COVID)
Influenza A by PCR: NEGATIVE
Influenza B by PCR: NEGATIVE
SARS Coronavirus 2 by RT PCR: NEGATIVE

## 2020-08-10 LAB — LACTIC ACID, PLASMA: Lactic Acid, Venous: 1.4 mmol/L (ref 0.5–1.9)

## 2020-08-10 LAB — MAGNESIUM
Magnesium: 2.4 mg/dL (ref 1.7–2.4)
Magnesium: 2.4 mg/dL (ref 1.7–2.4)

## 2020-08-10 LAB — PHOSPHORUS: Phosphorus: 2.9 mg/dL (ref 2.5–4.6)

## 2020-08-10 MED ORDER — SODIUM CHLORIDE 0.9 % IV SOLN
2.0000 g | Freq: Two times a day (BID) | INTRAVENOUS | Status: DC
  Administered 2020-08-11 – 2020-08-13 (×6): 2 g via INTRAVENOUS
  Filled 2020-08-10 (×6): qty 2

## 2020-08-10 MED ORDER — POTASSIUM CHLORIDE 20 MEQ/15ML (10%) PO SOLN
40.0000 meq | Freq: Once | ORAL | Status: AC
  Administered 2020-08-10: 40 meq via ORAL
  Filled 2020-08-10: qty 30

## 2020-08-10 MED ORDER — ONDANSETRON HCL 4 MG/2ML IJ SOLN: 4.0000 mg | Freq: Four times a day (QID) | INTRAMUSCULAR | Status: DC | PRN

## 2020-08-10 MED ORDER — ONDANSETRON HCL 4 MG PO TABS: 4.0000 mg | ORAL_TABLET | Freq: Four times a day (QID) | ORAL | Status: DC | PRN

## 2020-08-10 MED ORDER — SODIUM CHLORIDE 0.9 % IV BOLUS
500.0000 mL | Freq: Once | INTRAVENOUS | Status: AC
  Administered 2020-08-10: 500 mL via INTRAVENOUS

## 2020-08-10 MED ORDER — ENOXAPARIN SODIUM 30 MG/0.3ML ~~LOC~~ SOLN
30.0000 mg | SUBCUTANEOUS | Status: DC
  Administered 2020-08-10 – 2020-08-12 (×3): 30 mg via SUBCUTANEOUS
  Filled 2020-08-10 (×3): qty 0.3

## 2020-08-10 MED ORDER — VANCOMYCIN HCL 750 MG/150ML IV SOLN
750.0000 mg | Freq: Once | INTRAVENOUS | Status: AC
  Administered 2020-08-10: 750 mg via INTRAVENOUS
  Filled 2020-08-10: qty 150

## 2020-08-10 MED ORDER — IOHEXOL 300 MG/ML  SOLN
75.0000 mL | Freq: Once | INTRAMUSCULAR | Status: AC | PRN
  Administered 2020-08-10: 75 mL via INTRAVENOUS

## 2020-08-10 MED ORDER — METRONIDAZOLE IN NACL 5-0.79 MG/ML-% IV SOLN
500.0000 mg | Freq: Three times a day (TID) | INTRAVENOUS | Status: DC
  Administered 2020-08-10 – 2020-08-13 (×8): 500 mg via INTRAVENOUS
  Filled 2020-08-10 (×9): qty 100

## 2020-08-10 MED ORDER — LISINOPRIL 20 MG PO TABS
20.0000 mg | ORAL_TABLET | Freq: Every morning | ORAL | Status: DC
  Administered 2020-08-11 – 2020-08-17 (×6): 20 mg via ORAL
  Filled 2020-08-10 (×8): qty 1

## 2020-08-10 MED ORDER — POTASSIUM CHLORIDE IN NACL 20-0.45 MEQ/L-% IV SOLN
INTRAVENOUS | Status: DC
  Filled 2020-08-10 (×5): qty 1000

## 2020-08-10 MED ORDER — ASPIRIN EC 81 MG PO TBEC
81.0000 mg | DELAYED_RELEASE_TABLET | Freq: Every day | ORAL | Status: DC
  Administered 2020-08-10 – 2020-08-13 (×4): 81 mg via ORAL
  Filled 2020-08-10 (×4): qty 1

## 2020-08-10 MED ORDER — MAGNESIUM SULFATE 2 GM/50ML IV SOLN
2.0000 g | Freq: Once | INTRAVENOUS | Status: AC
  Administered 2020-08-10: 2 g via INTRAVENOUS
  Filled 2020-08-10: qty 50

## 2020-08-10 MED ORDER — ACETAMINOPHEN 650 MG RE SUPP: 650.0000 mg | Freq: Four times a day (QID) | RECTAL | Status: DC | PRN

## 2020-08-10 MED ORDER — ACETAMINOPHEN 500 MG PO TABS: 500.0000 mg | ORAL_TABLET | Freq: Four times a day (QID) | ORAL | Status: DC | PRN

## 2020-08-10 MED ORDER — ACETAMINOPHEN 325 MG PO TABS: 650.0000 mg | ORAL_TABLET | Freq: Four times a day (QID) | ORAL | Status: DC | PRN

## 2020-08-10 MED ORDER — THIAMINE HCL 100 MG/ML IJ SOLN
Freq: Once | INTRAVENOUS | Status: AC
  Filled 2020-08-10: qty 1000

## 2020-08-10 MED ORDER — VANCOMYCIN HCL 500 MG/100ML IV SOLN
500.0000 mg | INTRAVENOUS | Status: DC
  Administered 2020-08-11 – 2020-08-12 (×2): 500 mg via INTRAVENOUS
  Filled 2020-08-10 (×3): qty 100

## 2020-08-10 MED ORDER — VANCOMYCIN HCL IN DEXTROSE 1-5 GM/200ML-% IV SOLN: 1000.0000 mg | Freq: Once | INTRAVENOUS | Status: DC

## 2020-08-10 MED ORDER — SODIUM CHLORIDE 0.9 % IV SOLN
2.0000 g | Freq: Once | INTRAVENOUS | Status: AC
  Administered 2020-08-10: 2 g via INTRAVENOUS
  Filled 2020-08-10: qty 2

## 2020-08-10 MED ORDER — DRONABINOL 2.5 MG PO CAPS
2.5000 mg | ORAL_CAPSULE | Freq: Two times a day (BID) | ORAL | Status: DC
  Administered 2020-08-10 – 2020-08-17 (×6): 2.5 mg via ORAL
  Filled 2020-08-10 (×9): qty 1

## 2020-08-10 MED ORDER — CLOPIDOGREL BISULFATE 75 MG PO TABS: 75.0000 mg | ORAL_TABLET | Freq: Every day | ORAL | Status: DC

## 2020-08-10 MED ORDER — ATORVASTATIN CALCIUM 80 MG PO TABS
80.0000 mg | ORAL_TABLET | Freq: Every day | ORAL | Status: DC
  Administered 2020-08-11 – 2020-08-13 (×3): 80 mg via ORAL
  Filled 2020-08-10 (×3): qty 1
  Filled 2020-08-10: qty 2

## 2020-08-10 MED ORDER — METRONIDAZOLE IN NACL 5-0.79 MG/ML-% IV SOLN
500.0000 mg | Freq: Once | INTRAVENOUS | Status: AC
  Administered 2020-08-10: 500 mg via INTRAVENOUS
  Filled 2020-08-10: qty 100

## 2020-08-10 MED ORDER — POTASSIUM CHLORIDE 10 MEQ/100ML IV SOLN
10.0000 meq | INTRAVENOUS | Status: AC
  Administered 2020-08-10 (×4): 10 meq via INTRAVENOUS
  Filled 2020-08-10 (×4): qty 100

## 2020-08-10 MED ORDER — PANTOPRAZOLE SODIUM 40 MG PO TBEC
40.0000 mg | DELAYED_RELEASE_TABLET | Freq: Every day | ORAL | Status: DC
  Administered 2020-08-10 – 2020-08-13 (×4): 40 mg via ORAL
  Filled 2020-08-10 (×4): qty 1

## 2020-08-10 NOTE — ED Notes (Signed)
bair hugger applied.

## 2020-08-10 NOTE — ED Notes (Signed)
Pulmonary at bedside.

## 2020-08-10 NOTE — ED Notes (Signed)
Attempted to give reportx1 

## 2020-08-10 NOTE — ED Provider Notes (Signed)
[facility]ERGENCY DEPARTMENT Provider Note   CSN: [id number] Arrival date & time: 08/09/20  1448  LEVEL 5 CAVEAT - ALTERED MENTAL STATUS History No chief complaint on file.   [name] is a 59 y.o. female.  HPI 59 year old female presents with worsening weakness.  History is primarily from family at the bedside.  She was just admitted and discharged for stroke and released on 9/13.  Seem to be doing okay but the husband noticed that she was not eating at that time.  Has been having extremely poor p.o. intake at home and becoming progressively weaker.  Can hardly sit up on her own.  Because of this progressive weakness 911 was called and patient was brought into the hospital.  No known fevers or vomiting.  Sometimes she will put food in her mouth but seems to not even swallow it. Patient seems to acknowledge me talking to her but doesn't speak back to me.  Past Medical History:  Diagnosis Date  . 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 Cerebrovascular accident (stroke) ([place]) 2005  . Dental caries 01/27/2008  . GERD (gastroesophageal reflux disease) 11/27/2006  . 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 Hypertension 11/23/2006    Patient Active Problem List   Diagnosis Date Noted  . Sepsis ([place]) 08/10/2020  . Hypokalemia   . Sepsis due to pneumonia ([place]) 08/06/2020  . Acute metabolic encephalopathy 09/32/6712  . Iron deficiency anemia due to chronic blood loss 07/24/2020  . Severe protein-calorie malnutrition [place] Oiler: less than 60% of standard weight) ([place]) 07/24/2020  . Acute CVA (cerebrovascular accident) ([place]) 07/23/2020  . DENTAL CARIES 01/27/2008  . GERD 11/27/2006  . ANOREXIA 11/27/2006  . ABDOMINAL MASS 11/27/2006  . HYPERHOMOCYSTEINEMIA 11/23/2006  . Essential hypertension 11/23/2006  . LOW BACK PAIN 11/23/2006  . CEREBROVASCULAR ACCIDENT, HX OF 11/23/2006    Past Surgical  History:  Procedure Laterality Date  . CESAREAN SECTION    . cyst removed from right side  yrs ago  . EUS N/A 01/07/2014   Procedure: ESOPHAGEAL ENDOSCOPIC ULTRASOUND (EUS) RADIAL;  Surgeon: Arta Silence, MD;  Location: [facility];  Service: Endoscopy;  Laterality: N/A;  . FINE NEEDLE ASPIRATION N/A 01/07/2014   Procedure: FINE NEEDLE ASPIRATION (FNA) LINEAR;  Surgeon: Arta Silence, MD;  Location: [facility];  Service: Endoscopy;  Laterality: N/A;  . TUBAL LIGATION  yrs ago     OB History   No obstetric history on file.     Family History  Problem Relation Age of Onset  . Hypertension Mother   . Hypertension Daughter     Social History   Tobacco Use  . Smoking status: Former Smoker    Packs/day: 0.25    Years: 20.00    Pack years: 5.00    Types: Cigarettes    Quit date: 11/13/2013    Years since quitting: 6.7  . Smokeless tobacco: Never Used  Substance Use Topics  . Alcohol use: Yes    Comment: occasional beer  . Drug use: No    Home Medications Prior to Admission medications   Medication Sig Start Date End Date Taking? Authorizing Provider  acetaminophen (TYLENOL) 500 MG tablet Take 500 mg by mouth every 6 (six) hours as needed for moderate pain or headache.    [provider]  aspirin EC 81 MG tablet Take 81 mg by mouth daily. Swallow whole.    [provider]  atorvastatin (LIPITOR)  80 MG tablet Take 1 tablet (80 mg total) by mouth daily. 07/27/20   Little Ishikawa, MD  Calcium Carbonate Antacid (TUMS E-X PO) Take 1 tablet by mouth as needed.    [provider]  clopidogrel (PLAVIX) 75 MG tablet Take 1 tablet (75 mg total) by mouth daily. 07/26/20   Little Ishikawa, MD  lisinopril (PRINIVIL,ZESTRIL) 20 MG tablet Take 20 mg by mouth every morning.    [provider]  pantoprazole (PROTONIX) 40 MG tablet Take 40 mg by mouth daily.    [provider]    Allergies    Patient has no known allergies.  Review  of Systems   Review of Systems  Unable to perform ROS: Mental status change    Physical Exam Updated Vital Signs BP (!) 126/93   Pulse 100   Temp (!) 97.5 F (36.4 C) (Oral)   Resp 18   SpO2 98%   Physical Exam Vitals and nursing note reviewed.  Constitutional:      Appearance: She is well-developed. She is cachectic.  HENT:     Head: Normocephalic and atraumatic.     Right Ear: External ear normal.     Left Ear: External ear normal.     Nose: Nose normal.     Mouth/Throat:     Mouth: Mucous membranes are dry.  Eyes:     General:        Right eye: No discharge.        Left eye: No discharge.     Extraocular Movements: Extraocular movements intact.  Cardiovascular:     Rate and Rhythm: Regular rhythm. Tachycardia present.     Heart sounds: Normal heart sounds.  Pulmonary:     Effort: Pulmonary effort is normal.     Breath sounds: Normal breath sounds.  Abdominal:     Palpations: Abdomen is soft.     Tenderness: There is no abdominal tenderness.  Skin:    General: Skin is warm and dry.  Neurological:     Mental Status: She is alert.     Comments: Patient is awake, no apparent facial droop. Does not talk to me. Equal strength in BUE, LLE a little weaker compared to RLE.   Psychiatric:        Mood and Affect: Mood is not anxious.     ED Results / Procedures / Treatments   Labs (all labs ordered are listed, but only abnormal results are displayed) Labs Reviewed  URINALYSIS, ROUTINE W REFLEX MICROSCOPIC - Abnormal; Notable for the following components:      Result Value   APPearance HAZY (*)    Ketones, ur 5 (*)    Protein, ur 100 (*)    All other components within normal limits  COMPREHENSIVE METABOLIC PANEL - Abnormal; Notable for the following components:   Sodium 152 (*)    Potassium 2.5 (*)    Glucose, Bld 143 (*)    BUN 39 (*)    Albumin 1.9 (*)    Anion gap 16 (*)    All other components within normal limits  CBC WITH DIFFERENTIAL/PLATELET -  Abnormal; Notable for the following components:   WBC 16.1 (*)    Hemoglobin 11.9 (*)    MCH 24.9 (*)    MCHC 29.8 (*)    RDW 18.8 (*)    Platelets 470 (*)    Neutro Abs 12.3 (*)    Monocytes Absolute 1.5 (*)    Abs Immature Granulocytes 0.12 (*)  All other components within normal limits  CBG MONITORING, ED - Abnormal; Notable for the following components:   Glucose-Capillary 143 (*)    All other components within normal limits  RESPIRATORY PANEL BY RT PCR (FLU A&B, COVID)  CULTURE, BLOOD (ROUTINE X 2)  CULTURE, BLOOD (ROUTINE X 2)  LACTIC ACID, PLASMA  MAGNESIUM  BASIC METABOLIC PANEL  CBC  HIV ANTIBODY (ROUTINE TESTING W REFLEX)  MAGNESIUM  PHOSPHORUS  BASIC METABOLIC PANEL  CBG MONITORING, ED    EKG EKG Interpretation  Date/Time:  Tuesday August 10 2020 10:17:07 EDT Ventricular Rate:  114 PR Interval:  124 QRS Duration: 76 QT Interval:  390 QTC Calculation: 538 R Axis:   82 Text Interpretation: Sinus tachycardia Biatrial enlargement Borderline right axis deviation Probable LVH with secondary repol abnrm Prolonged QT interval Confirmed by Sherwood Gambler [phone]) on 08/10/2020 10:19:25 AM   Radiology CT Head Wo Contrast  Result Date: 08/10/2020 CLINICAL DATA:  Delirium with altered mental status EXAM: CT HEAD WITHOUT CONTRAST TECHNIQUE: Contiguous axial images were obtained from the base of the skull through the vertex without intravenous contrast. COMPARISON:  07/24/2020 and 07/23/2020 FINDINGS: Brain: Extensive RIGHT parietal and temporal encephalomalacia compatible with prior RIGHT MCA infarct. Signs of recent infarct involving the LEFT anterior frontal lobe with continued evolution of low attenuation. Mild edema without midline shift. Mild heterogeneity may reflect small foci of petechial hemorrhage. No signs of extra-axial fluid. No new area of abnormality. Atrophy and chronic microvascular ischemic change. Vascular: No hyperdense vessel or unexpected  calcification. Skull: Normal. Negative for fracture or focal lesion. Sinuses/Orbits: Visualized paranasal sinuses and orbits without acute process. Other: None IMPRESSION: 1. Continued evolution of left anterior frontal lobe infarct. Mild heterogeneity reflects mild petechial hemorrhage, no malignant hemorrhagic transformation or signs of mass effect. 2. Extensive RIGHT parietal and temporal encephalomalacia. 3. Atrophy and chronic microvascular ischemic change. Electronically Signed   By: Zetta Bills M.D.   On: 08/10/2020 11:53   CT Chest W Contrast  Addendum Date: 08/10/2020   ADDENDUM REPORT: 08/10/2020 12:49 ADDENDUM: Additionally scattered thyroid nodules are noted. No followup recommended (ref: J Am Coll Radiol. 2015 Feb;12(2): 143-50). Electronically Signed   By: Inez Catalina M.D.   On: 08/10/2020 12:49   Result Date: 08/10/2020 CLINICAL DATA:  Follow-up masslike lesion on recent chest x-ray EXAM: CT CHEST WITH CONTRAST TECHNIQUE: Multidetector CT imaging of the chest was performed during intravenous contrast administration. CONTRAST:  31mL OMNIPAQUE IOHEXOL 300 MG/ML  SOLN COMPARISON:  Chest x-ray from earlier in the same day as well as 07/23/2020 FINDINGS: Cardiovascular: Thoracic aorta demonstrates some mild atherosclerotic calcifications although no aneurysmal dilatation is seen. No cardiac enlargement is noted. Coronary calcifications are noted. The pulmonary artery as visualized shows no definitive emboli although some mass effect upon the left main pulmonary artery is noted. Mediastinum/Nodes: Thoracic inlet demonstrates scattered small thyroid nodules the largest of which measures less than 1 cm. There is a large centrally necrotic mass lesion centered in the AP window consistent with lymphadenopathy. This measures at least 5.8 x 4.4 cm in greatest dimension. Additionally necrotic subcarinal adenopathy is noted measuring 3.1 by 3.3 cm. Scattered smaller nodes are seen. The trachea and  esophagus are significantly deviated to the right secondary to these changes. Lungs/Pleura: Right lung demonstrates some emphysematous changes although no focal infiltrate or sizable effusion is seen. No parenchymal nodule is noted. In the left lung however, there is a large cavitary lesion centrally with irregular thickened borders which is intimately  apposed to the central adenopathy. This also shows erosive changes into the left mainstem bronchus which is significantly compressed by the adjacent adenopathy. Additionally there appears to be in growth of tumor into the pulmonary venous system on the left best visualized on image number 72 of series 3. Small left-sided pleural effusion is noted. Some patchy airspace opacity is noted likely related to central obstructive change. Upper Abdomen: Gallbladder has been surgically removed. Dilatation of the biliary tree is seen. Scattered calcifications are noted within the pancreas consistent with chronic pancreatitis. No other focal abnormality is noted. Musculoskeletal: Bony structures show no changes of metastatic disease. Degenerative change of the thoracic spine is noted. IMPRESSION: Large cavitary lesion with irregular walls in the left upper lobe abutting the left hilum with associated hilar and mediastinal necrotic adenopathy. 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 Findings of erosive change into the left bronchial tree are noted as well as ingrowth into the left pulmonary vein likely related to tumor thrombus. Some associated left lower lobe infiltrate is noted likely related to central bronchial occlusion. Small left pleural effusion is noted as well. Aortic Atherosclerosis (ICD10-I70.0) and Emphysema (ICD10-J43.9). Electronically Signed: By: Inez Catalina M.D. On: 08/10/2020 11:39   DG Chest Portable 1 View  Result Date: 08/10/2020 CLINICAL DATA:  Sepsis EXAM: PORTABLE CHEST 1 VIEW COMPARISON:  07/23/2020  FINDINGS: Cardiac shadow is stable. Mild elevation of left hemidiaphragm is again seen. Patchy somewhat rounded density is again seen within the right lung. No new focal infiltrate is seen. No sizable effusion is noted. IMPRESSION: Patchy somewhat rounded density in the left mid to lower lung relatively stable from the prior exam. Although persistent pneumonia deserves consideration the possibility of neoplasm remains. CT of the chest with contrast is recommended for further evaluation. Electronically Signed   By: Inez Catalina M.D.   On: 08/10/2020 10:01    Procedures .Critical Care Performed by: Sherwood Gambler, MD Authorized by: Sherwood Gambler, MD   Critical care provider statement:    Critical care time (minutes):  35   Critical care time was exclusive of:  Separately billable procedures and treating other patients   Critical care was necessary to treat or prevent imminent or life-threatening deterioration of the following conditions:  Metabolic crisis and sepsis   Critical care was time spent personally by me on the following activities:  Discussions with consultants, evaluation of patient's response to treatment, examination of patient, ordering and performing treatments and interventions, ordering and review of laboratory studies, ordering and review of radiographic studies, pulse oximetry, re-evaluation of patient's condition, obtaining history from patient or surrogate and review of old charts   (including critical care time)  Medications Ordered in ED Medications  potassium chloride 10 mEq in 100 mL IVPB (10 mEq Intravenous New Bag/Given 08/10/20 1511)  ceFEPIme (MAXIPIME) 2 g in sodium chloride 0.9 % 100 mL IVPB (has no administration in time range)  vancomycin (VANCOREADY) IVPB 500 mg/100 mL (has no administration in time range)  aspirin EC tablet 81 mg (has no administration in time range)  atorvastatin (LIPITOR) tablet 80 mg (has no administration in time range)  lisinopril  (ZESTRIL) tablet 20 mg (has no administration in time range)  pantoprazole (PROTONIX) EC tablet 40 mg (has no administration in time range)  enoxaparin (LOVENOX) injection 30 mg (has no administration in time range)  0.45 % NaCl with KCl 20 mEq / L infusion (has no administration in time range)  acetaminophen (TYLENOL)  tablet 650 mg (has no administration in time range)    Or  acetaminophen (TYLENOL) suppository 650 mg (has no administration in time range)  ondansetron (ZOFRAN) tablet 4 mg (has no administration in time range)    Or  ondansetron (ZOFRAN) injection 4 mg (has no administration in time range)  metroNIDAZOLE (FLAGYL) IVPB 500 mg (has no administration in time range)  dronabinol (MARINOL) capsule 2.5 mg (has no administration in time range)  sodium chloride 0.9 % bolus 500 mL (0 mLs Intravenous Stopped 08/10/20 1258)  ceFEPIme (MAXIPIME) 2 g in sodium chloride 0.9 % 100 mL IVPB (0 g Intravenous Stopped 08/10/20 1113)  metroNIDAZOLE (FLAGYL) IVPB 500 mg (0 mg Intravenous Stopped 08/10/20 1236)  vancomycin (VANCOREADY) IVPB 750 mg/150 mL (0 mg Intravenous Stopped 08/10/20 1411)  potassium chloride 20 MEQ/15ML (10%) solution 40 mEq (40 mEq Oral Given 08/10/20 1512)  magnesium sulfate IVPB 2 g 50 mL (0 g Intravenous Stopped 08/10/20 1241)  iohexol (OMNIPAQUE) 300 MG/ML solution 75 mL (75 mLs Intravenous Contrast Given 08/10/20 1120)  sodium chloride 0.9 % 1,000 mL with thiamine 163 mg, folic acid 1 mg, multivitamins adult 10 mL infusion ( Intravenous New Bag/Given 08/10/20 1509)    ED Course  I have reviewed the triage vital signs and the nursing notes.  Pertinent labs & imaging results that were available during my care of the patient were reviewed by me and considered in my medical decision making (see chart for details).  Clinical Course as of Aug 10 1536  Tue Aug 10, 2020  0942 Patient's rectal temp is less than 94. Given this in association with tachycardia and leukocytosis, will  consider sepsis and call a code sepsis. Give broad antibiotics and fluids.    [SG]    Clinical Course User Index [SG] Sherwood Gambler, MD   MDM Rules/Calculators/A&P                          Patient was started on sepsis protocol given the tachycardia, hypothermia and elevated WBC.  Unclear source at this time.  She is also noted to be quite hypokalemic which is probably from poor p.o. intake and is likely contributing to her weakness.  She was given IV potassium.  Unfortunately her CT shows what is likely cancer which I have relayed to the husband.  She will need admission by the [facility] hospitalist service. Final Clinical Impression(s) / ED Diagnoses Final diagnoses:  Hypokalemia    Rx / DC Orders ED Discharge Orders    None       Sherwood Gambler, MD 08/10/20 1537

## 2020-08-10 NOTE — Consult Note (Signed)
NAME:  [name], MRN:  [id number], DOB:  [birth year], LOS: 0 ADMISSION DATE:  08/09/2020, CONSULTATION DATE:  08/10/20 REFERRING MD:  [place] - [facility], CHIEF COMPLAINT:  Worsening weakness.  Brief History   59 yo F with recent CVA (hospitalized 07/23/20) who presents 9/28 with poor PO intake, AMS. CT H and CT chest performed in ED.  PCCM consulted in setting of CT chest: Left upper lobe cavitary lesion with associated hilar and mediastinal necrotic adenopathy -- suspicious for squamous cell carcinoma.   History of present illness   59 yo F with recent CVA presents to ED due to family report of progressive weakness, poor PO intake, AMS. Poor PO intake began 1 week ago, and was an abrupt decrease in oral intake. Denied abd pain, n/v, difficulty swallowing, endorses loss of appetite. 9/27 pt with AMS and significant weakness-- unable to rise from chair. Husband reports significant ongoing weight loss.   Brought to ED, CT H with progression of recent L CVA, R encephalomalacia. CT chest with L sided cavitary lesion, mediastinal necrotic adenopathy-- suspicious for squamous cell carcinoma. PCCM subsequently asked to see in consultation.  I have had an extensive discussion with pt's husband Ronnie Doss. We discussed pt's hx, recent decline (he says she has been spiraling downhill for almost a year with poor PO intake, pain complaints, inability to perform all daily ADL's, etc) and current circumstances. We also discussed patient's prior wishes under circumstances such as this. I recommended DNR with palliation / hospice if she survives to hospital discharge and Ronnie Doss states he has been thinking about this all day and that he believes this would be in his wifes best interest.  He supports this and is in agreement to not pursue any biopsies and to not perform resuscitation if arrest were to occur, but to otherwise continue with current medical support / therapies.  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 Large LUL mass with hilar and mediastinal LAD. Trachea and esophagus are significant deviated to R.  CT H 9/28:  Continued evolution of L anterior frontal lobe infarct, petechial  hemorrhage. No mass effect.  R parietal and temporal encephalopmalacia. Atrophy, chronic ischemic changes   Micro Data:  9/28 SARS Cov2> neg  9/28 Flu A/B> neg   Antimicrobials:    Interim history/subjective:  Sleepy but opens eyes to voice.  Does not follow commands.  Objective   Blood pressure 120/84, pulse (!) 105, temperature (!) 93.9 F (34.4 C), temperature source Rectal, resp. rate 19, SpO2 94 %.        Intake/Output Summary (Last 24 hours) at 08/10/2020 1427 Last data filed at 08/10/2020 1411 Gross per 24 hour  Intake 1224.96 ml  Output --  Net 1224.96 ml   There were no vitals filed for this visit.  Examination: General: Adult female, cachectic, frail, chronically ill appearing, in NAD. Neuro: Sleepy but opens eyes to voice.  Doesn't follow commands. HEENT: [place]/AT. Sclerae anicteric. MM dry.. Cardiovascular: RRR, no M/R/G.  Lungs: Respirations even and unlabored.  Diminished left side. Abdomen: BS x 4, soft, NT/ND.  Musculoskeletal: Muscle wasting throughout.  No gross deformities, no edema.  Skin: Intact, warm, no rashes.   Assessment & Plan:   Large LUL lesion with hilar and mediastinal LAD - highly suspicious for underlying malignancy (? Squamous cell).  Unfortunately given her history and recent decline with poor quality of life, she would be a very poor candidate for biopsy and any chemotherapy, etc.  I discussed  this with her husband Ronnie Doss who is in agreement and supports my recommendation to provide supportive therapies but place DNR orders on the chart and not pursue any form of biopsies etc.  He understands that she might not survive to leave the hospital based on how frail she is. Failure to thrive - presumed 2/2 above + hx of multiple strokes. - DNR orders placed. - Supportive care per primary team. - If reaches the point where she can be discharged, would strongly consider home hospice.   Rest per primary team.  Nothing further to add.  PCCM will sign off.  Please do not hesitate to call us back if we can be of any further assistance.   Labs   CBC: Recent Labs  Lab 08/10/20 0906  WBC 16.1*  NEUTROABS 12.3*  HGB 11.9*  HCT 40.0  MCV 83.7  PLT 470*    Basic Metabolic Panel: Recent Labs  Lab 08/10/20 0906  NA 152*  K 2.5*  CL 104  CO2 32  GLUCOSE 143*  BUN 39*  CREATININE 0.80  CALCIUM 10.3  MG 2.4   GFR: Estimated Creatinine Clearance: 43.4 mL/min (by C-G formula based on SCr of 0.8 mg/dL). Recent Labs  Lab 08/10/20 0906  WBC 16.1*  LATICACIDVEN 1.4    Liver Function Tests: Recent Labs  Lab 08/10/20 0906  AST 30  ALT 17  ALKPHOS 89  BILITOT 0.8  PROT 7.9  ALBUMIN 1.9*   No results for input(s): LIPASE, AMYLASE in the last 168 hours. No results for input(s): AMMONIA in the last 168 hours.  ABG No results found for: PHART, PCO2ART, PO2ART, HCO3, TCO2, ACIDBASEDEF, O2SAT   Coagulation Profile: No results for input(s): INR, PROTIME in the last 168 hours.  Cardiac Enzymes: No results for input(s): CKTOTAL, CKMB, CKMBINDEX, TROPONINI in the last 168 hours.  HbA1C: Hgb A1c MFr Bld  Date/Time Value Ref Range Status  07/23/2020 08:26 PM 6.1 (H) 4.8 - 5.6 % Final    Comment:    (NOTE) Pre diabetes:          5.7%-6.4%  Diabetes:              >6.4%  Glycemic control for   <7.0% adults with diabetes     CBG: Recent Labs  Lab 08/10/20 0911    GLUCAP 143*    Review of Systems:   Unable to obtain as pt is encephalopathic.  Past Medical History  She,  has a past medical history of Abdominal mass (11/27/2006), Anorexia (11/27/2006), Cerebrovascular accident (stroke) ([place]) (2005), Dental caries (01/27/2008), GERD (gastroesophageal reflux disease) (11/27/2006), Hyperhomocysteinemia ([place]) (11/23/2006), and Hypertension (11/23/2006).   Surgical History    Past Surgical History:  Procedure Laterality Date  . CESAREAN SECTION    . cyst removed from right side  yrs ago  . EUS N/A 01/07/2014   Procedure: ESOPHAGEAL ENDOSCOPIC ULTRASOUND (EUS) RADIAL;  Surgeon: Arta Silence, MD;  Location: [facility] ENDOSCOPY;  Service: Endoscopy;  Laterality: N/A;  . FINE NEEDLE ASPIRATION N/A 01/07/2014   Procedure: FINE NEEDLE ASPIRATION (FNA) LINEAR;  Surgeon: Arta Silence, MD;  Location: [facility] ENDOSCOPY;  Service: Endoscopy;  Laterality: N/A;  .  TUBAL LIGATION  yrs ago     Social History   reports that she quit smoking about 6 years ago. Her smoking use included cigarettes. She has a 5.00 pack-year smoking history. She has never used smokeless tobacco. She reports current alcohol use. She reports that she does not use drugs.   Family History   Her family history includes Hypertension in her daughter and mother.   Allergies No Known Allergies   Home Medications  Prior to Admission medications   Medication Sig Start Date End Date Taking? Authorizing Provider  acetaminophen (TYLENOL) 500 MG tablet Take 500 mg by mouth every 6 (six) hours as needed for moderate pain or headache.    [provider]  aspirin EC 81 MG tablet Take 81 mg by mouth daily. Swallow whole.    [provider]  atorvastatin (LIPITOR) 80 MG tablet Take 1 tablet (80 mg total) by mouth daily. 07/27/20   Little Ishikawa, MD  Calcium Carbonate Antacid (TUMS E-X PO) Take 1 tablet by mouth as needed.    [provider]  clopidogrel (PLAVIX) 75 MG tablet  Take 1 tablet (75 mg total) by mouth daily. 07/26/20   Little Ishikawa, MD  lisinopril (PRINIVIL,ZESTRIL) 20 MG tablet Take 20 mg by mouth every morning.    [provider]  pantoprazole (PROTONIX) 40 MG tablet Take 40 mg by mouth daily.    [provider]     Montey Hora, [facility] 08/10/2020, 3:38 PM

## 2020-08-10 NOTE — ED Notes (Signed)
Pt pulled cardiac monitor and pulse sensor off, pt had legs out of bed, pt directed to bed and monitors replaced, pt denies pain at this time, will continue to monitor.

## 2020-08-10 NOTE — H&P (Signed)
History and Physical    [name] YNW:[id number] DOB: 1961/06/22 DOA: 08/09/2020  PCP: London Pepper, MD (Confirm with patient/family/NH records and if not entered, this has to be entered at [facility] point of entry) Patient coming from: Home  I have personally briefly reviewed patient's old medical records in [place]  Chief Complaint: Not eating drinking, altered mental status  HPI: [name] is a 59 y.o. female with medical history significant of multifocal CVA recently, HTN, hyperhomocystinemia, HLD, GERD, presented with poor oral intake and confusion.  Patient was confused in the ED, most history was provided by patient husband over the phone.  Patient was recently hospitalized for a stroke and left-sided pneumonia.  Pneumonia was located on LLL on last admission about 2 weeks ago, was treated with 3 days course of antibiotics.  Patient completed stroke work-up and sent home.  Husband reported that patient went home, appeared to assume her regular daily activity including eating and drinking for about 1 week, then starting from about 1 week ago patient was sudden stopped eating and drinking with catheter.  Patient only took her pills along with some applesauce in the morning and then will sitting in a chair the whole day and went to bed without eating or drinking anything.  Husband reported that the patient did not complain of any abdominal pain nausea vomit, but just said there is no appetite.  No cough or choking.  Yesterday patient woke up and became confused and stopped talking, and feeling so weak unable to stand up from her chair.  Furthermore, husband reported the patient has been losing weight since last year, and he was shocked when taking patient to PCP for post hospitalization visit 5 days ago and was told patient weight was only 80 pounds. ED Course: Patient was found to be very confused, malnourished, lab value showed sodium 133, potassium 2.5, albumin 1.9.  Checks her x-ray  showed persistent left middle lobe to lower lobe infiltrates, CT showed large cavity lesion on the left upper lobe with mediastinal necrotic adenopathy consistent with squamous cell carcinoma.  Blood work significant hemoconcentration and WBC 6.1  Review of Systems: Unable to perform patient went confused.   Past Medical History:  Diagnosis Date  . 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 Cerebrovascular accident (stroke) ([place]) 2005  . Dental caries 01/27/2008  . GERD (gastroesophageal reflux disease) 11/27/2006  . Hyperhomocysteinemia ([place]) 11/23/2006   history of mild  . Hypertension 11/23/2006    Past Surgical History:  Procedure Laterality Date  . CESAREAN SECTION    . cyst removed from right side  yrs ago  . EUS N/A 01/07/2014   Procedure: ESOPHAGEAL ENDOSCOPIC ULTRASOUND (EUS) RADIAL;  Surgeon: Arta Silence, MD;  Location: [facility];  Service: Endoscopy;  Laterality: N/A;  . FINE NEEDLE ASPIRATION N/A 01/07/2014   Procedure: FINE NEEDLE ASPIRATION (FNA) LINEAR;  Surgeon: Arta Silence, MD;  Location: [facility];  Service: Endoscopy;  Laterality: N/A;  . TUBAL LIGATION  yrs ago     reports that she quit smoking about 6 years ago. Her smoking use included cigarettes. She has a 5.00 pack-year smoking history. She has never used smokeless tobacco. She reports current alcohol use. She reports that she does not use drugs.  No Known Allergies  Family History  Problem Relation Age of Onset  . Hypertension Mother   . Hypertension Daughter      Prior to Admission medications   Medication  Sig Start Date End Date Taking? Authorizing Provider  acetaminophen (TYLENOL) 500 MG tablet Take 500 mg by mouth every 6 (six) hours as needed for moderate pain or headache.    [provider]  aspirin EC 81 MG tablet Take 81 mg by mouth daily. Swallow whole.    [provider]  atorvastatin (LIPITOR) 80 MG tablet Take 1 tablet  (80 mg total) by mouth daily. 07/27/20   Little Ishikawa, MD  Calcium Carbonate Antacid (TUMS E-X PO) Take 1 tablet by mouth as needed.    [provider]  clopidogrel (PLAVIX) 75 MG tablet Take 1 tablet (75 mg total) by mouth daily. 07/26/20   Little Ishikawa, MD  lisinopril (PRINIVIL,ZESTRIL) 20 MG tablet Take 20 mg by mouth every morning.    [provider]  pantoprazole (PROTONIX) 40 MG tablet Take 40 mg by mouth daily.    [provider]    Physical Exam: Vitals:   2020/08/30 1044 08/30/20 1215 August 30, 2020 1245 08-30-20 1400  BP: (!) 135/97 104/78 106/74 120/84  Pulse: (!) 113 (!) 102 (!) 108 (!) 105  Resp: 16 20 13 19   Temp:      TempSrc:      SpO2: 100% 94% 94% 94%    Constitutional: NAD, calm, comfortable Vitals:   08-30-20 1044 08-30-20 1215 Aug 30, 2020 1245 2020/08/30 1400  BP: (!) 135/97 104/78 106/74 120/84  Pulse: (!) 113 (!) 102 (!) 108 (!) 105  Resp: 16 20 13 19   Temp:      TempSrc:      SpO2: 100% 94% 94% 94%   General, very cachexia Eyes: PERRL, lids and conjunctivae normal ENMT: Mucous membranes are dry. Posterior pharynx clear of any exudate or lesions.Normal dentition.  Neck: normal, supple, no masses, no thyromegaly Respiratory: clear to auscultation bilaterally, no wheezing, no crackles. Normal respiratory effort. No accessory muscle use.  Cardiovascular: Regular rate and rhythm, no murmurs / rubs / gallops. No extremity edema. 2+ pedal pulses. No carotid bruits.  Abdomen: no tenderness, no masses palpated. No hepatosplenomegaly. Bowel sounds positive.  Musculoskeletal: no clubbing / cyanosis. No joint deformity upper and lower extremities. Good ROM, no contractures. Normal muscle tone.  Skin: no rashes, lesions, ulcers. No induration Neurologic: Moving all limbs but confused, following simple commands Psychiatric: Confused    Labs on Admission: I have personally reviewed following labs and imaging studies  CBC: Recent Labs    Lab 30-Aug-2020 0906  WBC 16.1*  NEUTROABS 12.3*  HGB 11.9*  HCT 40.0  MCV 83.7  PLT 846*   Basic Metabolic Panel: Recent Labs  Lab 08/30/2020 0906  NA 152*  K 2.5*  CL 104  CO2 32  GLUCOSE 143*  BUN 39*  CREATININE 0.80  CALCIUM 10.3  MG 2.4   GFR: Estimated Creatinine Clearance: 43.4 mL/min (by C-G formula based on SCr of 0.8 mg/dL). Liver Function Tests: Recent Labs  Lab 2020/08/30 0906  AST 30  ALT 17  ALKPHOS 89  BILITOT 0.8  PROT 7.9  ALBUMIN 1.9*   No results for input(s): LIPASE, AMYLASE in the last 168 hours. No results for input(s): AMMONIA in the last 168 hours. Coagulation Profile: No results for input(s): INR, PROTIME in the last 168 hours. Cardiac Enzymes: No results for input(s): CKTOTAL, CKMB, CKMBINDEX, TROPONINI in the last 168 hours. BNP (last 3 results) No results for input(s): PROBNP in the last 8760 hours. HbA1C: No results for input(s): HGBA1C in the last 72 hours. CBG: Recent Labs  Lab 08/10/20 0911  GLUCAP 143*   Lipid Profile: No results for input(s): CHOL, HDL, LDLCALC, TRIG, CHOLHDL, LDLDIRECT in the last 72 hours. Thyroid Function Tests: No results for input(s): TSH, T4TOTAL, FREET4, T3FREE, THYROIDAB in the last 72 hours. Anemia Panel: No results for input(s): VITAMINB12, FOLATE, FERRITIN, TIBC, IRON, RETICCTPCT in the last 72 hours. Urine analysis:    Component Value Date/Time   COLORURINE YELLOW 08/10/2020 1150   APPEARANCEUR HAZY (A) 08/10/2020 1150   LABSPEC 1.025 08/10/2020 1150   PHURINE 5.0 08/10/2020 1150   GLUCOSEU NEGATIVE 08/10/2020 1150   HGBUR NEGATIVE 08/10/2020 1150   BILIRUBINUR NEGATIVE 08/10/2020 1150   KETONESUR 5 (A) 08/10/2020 1150   PROTEINUR 100 (A) 08/10/2020 1150   UROBILINOGEN 0.2 06/23/2013 1622   NITRITE NEGATIVE 08/10/2020 1150   LEUKOCYTESUR NEGATIVE 08/10/2020 1150    Radiological Exams on Admission: CT Head Wo Contrast  Result Date: 08/10/2020 CLINICAL DATA:  Delirium with altered  mental status EXAM: CT HEAD WITHOUT CONTRAST TECHNIQUE: Contiguous axial images were obtained from the base of the skull through the vertex without intravenous contrast. COMPARISON:  07/24/2020 and 07/23/2020 FINDINGS: Brain: Extensive RIGHT parietal and temporal encephalomalacia compatible with prior RIGHT MCA infarct. Signs of recent infarct involving the LEFT anterior frontal lobe with continued evolution of low attenuation. Mild edema without midline shift. Mild heterogeneity may reflect small foci of petechial hemorrhage. No signs of extra-axial fluid. No new area of abnormality. Atrophy and chronic microvascular ischemic change. Vascular: No hyperdense vessel or unexpected calcification. Skull: Normal. Negative for fracture or focal lesion. Sinuses/Orbits: Visualized paranasal sinuses and orbits without acute process. Other: None IMPRESSION: 1. Continued evolution of left anterior frontal lobe infarct. Mild heterogeneity reflects mild petechial hemorrhage, no malignant hemorrhagic transformation or signs of mass effect. 2. Extensive RIGHT parietal and temporal encephalomalacia. 3. Atrophy and chronic microvascular ischemic change. Electronically Signed   By: Zetta Bills M.D.   On: 08/10/2020 11:53   CT Chest W Contrast  Addendum Date: 08/10/2020   ADDENDUM REPORT: 08/10/2020 12:49 ADDENDUM: Additionally scattered thyroid nodules are noted. No followup recommended (ref: J Am Coll Radiol. 2015 Feb;12(2): 143-50). Electronically Signed   By: Inez Catalina M.D.   On: 08/10/2020 12:49   Result Date: 08/10/2020 CLINICAL DATA:  Follow-up masslike lesion on recent chest x-ray EXAM: CT CHEST WITH CONTRAST TECHNIQUE: Multidetector CT imaging of the chest was performed during intravenous contrast administration. CONTRAST:  10mL OMNIPAQUE IOHEXOL 300 MG/ML  SOLN COMPARISON:  Chest x-ray from earlier in the same day as well as 07/23/2020 FINDINGS: Cardiovascular: Thoracic aorta demonstrates some mild  atherosclerotic calcifications although no aneurysmal dilatation is seen. No cardiac enlargement is noted. Coronary calcifications are noted. The pulmonary artery as visualized shows no definitive emboli although some mass effect upon the left main pulmonary artery is noted. Mediastinum/Nodes: Thoracic inlet demonstrates scattered small thyroid nodules the largest of which measures less than 1 cm. There is a large centrally necrotic mass lesion centered in the AP window consistent with lymphadenopathy. This measures at least 5.8 x 4.4 cm in greatest dimension. Additionally necrotic subcarinal adenopathy is noted measuring 3.1 by 3.3 cm. Scattered smaller nodes are seen. The trachea and esophagus are significantly deviated to the right secondary to these changes. Lungs/Pleura: Right lung demonstrates some emphysematous changes although no focal infiltrate or sizable effusion is seen. No parenchymal nodule is noted. In the left lung however, there is a large cavitary lesion centrally with irregular thickened borders which is intimately apposed to the  central adenopathy. This also shows erosive changes into the left mainstem bronchus which is significantly compressed by the adjacent adenopathy. Additionally there appears to be in growth of tumor into the pulmonary venous system on the left best visualized on image number 72 of series 3. Small left-sided pleural effusion is noted. Some patchy airspace opacity is noted likely related to central obstructive change. Upper Abdomen: Gallbladder has been surgically removed. Dilatation of the biliary tree is seen. Scattered calcifications are noted within the pancreas consistent with chronic pancreatitis. No other focal abnormality is noted. Musculoskeletal: Bony structures show no changes of metastatic disease. Degenerative change of the thoracic spine is noted. IMPRESSION: Large cavitary lesion with irregular walls in the left upper lobe abutting the left hilum with  associated hilar and mediastinal necrotic adenopathy. 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 Findings of erosive change into the left bronchial tree are noted as well as ingrowth into the left pulmonary vein likely related to tumor thrombus. Some associated left lower lobe infiltrate is noted likely related to central bronchial occlusion. Small left pleural effusion is noted as well. Aortic Atherosclerosis (ICD10-I70.0) and Emphysema (ICD10-J43.9). Electronically Signed: By: Inez Catalina M.D. On: 08/10/2020 11:39   DG Chest Portable 1 View  Result Date: 08/10/2020 CLINICAL DATA:  Sepsis EXAM: PORTABLE CHEST 1 VIEW COMPARISON:  07/23/2020 FINDINGS: Cardiac shadow is stable. Mild elevation of left hemidiaphragm is again seen. Patchy somewhat rounded density is again seen within the right lung. No new focal infiltrate is seen. No sizable effusion is noted. IMPRESSION: Patchy somewhat rounded density in the left mid to lower lung relatively stable from the prior exam. Although persistent pneumonia deserves consideration the possibility of neoplasm remains. CT of the chest with contrast is recommended for further evaluation. Electronically Signed   By: Inez Catalina M.D.   On: 08/10/2020 10:01    EKG: Independently reviewed.  Sinus tachycardia, prolonged QTC  Assessment/Plan Active Problems:   Sepsis ([place])  (please populate well all problems here in Problem List. (For example, if patient is on BP meds at home and you resume or decide to hold them, it is a problem that needs to be her. Same for CAD, COPD, HLD and so on)  Sepsis secondary to left lung pneumonia obstructive versus healthcare acquired -Sepsis as evidenced by hypothermia, elevated white count, and organ damage of acute encephalopathy -Agreed with broad spectrum coverage for now given the cavity/necrotizing feature, vancomycin cefepime and Flagyl -Discussed with pulmonology.  Who will come down  to see the patient emergently. -Received IV boluses in ED, will continue high rate IV fluids today  Left lung cancer -Discussed with pulmonology, will come down to see the patient emergently and then decide further treatment plan.  Overall prognosis very poor, husband however still desire patient remain on full code.  Will discuss above patient's new finding with patient once her mentation improved.  Her nutrition status is a obvious barrier for further cancer treatment.  Husband made aware, will discuss with patient husband and patient herself and then probably introduced to him with palliative care.  Severe hypokalemia -From poor oral intake -IV and p.o. replacement -Check magnesium and phosphorus -Repeat potassium level this afternoon  Acute metabolic encephalopathy -Estimated this is multifactorial from sepsis and hypernatremia -Hydration and antibiotics  Hypernatremia -Calculated free water deficit 3.0 L -Half normal saline, and banana bag yesterday  Cachexia/severe protein calorie malnutrition -Patient passed swallow evaluation last time and husband did not report  any symptoms signs of aspiration. -Ordered protein shake, trial of Marinol, consult nutrition -Discussed with patient husband regarding feeding tube, patient currently confused cannot deliver the conversation about feeding tube, will try to talk to patient about feeding tube once patient mentation improved. -Add banana bag for today for possible thiamine deficiency from poor oral intake  Prolonged QTC -Hold BP meds  Recent CVA -Seems to be a independent event, as the multifocal DVA found on MRI last time did not report any bleeding or edema which seemed argue against metastatic or cancer embolic etiology.  DVT prophylaxis: Lovenox Code Status: Full code Family Communication: Husband over phone Disposition Plan: Patient very sick with multiple medical issues of pneumonia, extensive electrolyte abnormalities,  mentation changes and newly found lung cancer, expect 5 to 7 days hospital stay Consults called: Pulmonology Admission status: PCU   Lequita Halt MD [facility] Pager [phone]  08/10/2020, 2:18 PM

## 2020-08-10 NOTE — Progress Notes (Signed)
Pharmacy Antibiotic Note  [name] is a 59 y.o. female admitted on 08/09/2020 with sepsis.  Pharmacy has been consulted for vancomycin and cefepime dosing. Pt is hypothermic and WBC is elevated at 16.1. SCr is WNL and lactic acid is <2.   Plan: Cefepime 2gm IV Q12H Vancomycin 750mg  IV x 1 then 500mg  IV Q24H F/u renal fxn, C&S, clinical status and trough at SS    Temp (24hrs), Avg:96 F (35.6 C), Min:93.9 F (34.4 C), Max:97.8 F (36.6 C)  Recent Labs  Lab 08/10/20 0906  WBC 16.1*  CREATININE 0.80  LATICACIDVEN 1.4    Estimated Creatinine Clearance: 43.4 mL/min (by C-G formula based on SCr of 0.8 mg/dL).    No Known Allergies  Antimicrobials this admission: Vanc 9/28>> Cefepime 9/28>> Flagyl x 1 9/28  Dose adjustments this admission: N/A  Microbiology results: Pending  Thank you for allowing pharmacy to be a part of this patient's care.  Arye Weyenberg, Rande Lawman 08/10/2020 9:47 AM

## 2020-08-11 DIAGNOSIS — R918 Other nonspecific abnormal finding of lung field: Secondary | ICD-10-CM | POA: Diagnosis present

## 2020-08-11 LAB — BASIC METABOLIC PANEL
Anion gap: 13 (ref 5–15)
BUN: 27 mg/dL — ABNORMAL HIGH (ref 8–23)
CO2: 24 mmol/L (ref 22–32)
Calcium: 9.2 mg/dL (ref 8.9–10.3)
Chloride: 108 mmol/L (ref 98–111)
Creatinine, Ser: 0.65 mg/dL (ref 0.44–1.00)
GFR calc Af Amer: >60 mL/min (ref >60)
GFR calc non Af Amer: >60 mL/min (ref >60)
Glucose, Bld: 149 mg/dL — ABNORMAL HIGH (ref 70–99)
Potassium: 4.3 mmol/L (ref 3.5–5.1)
Sodium: 145 mmol/L (ref 135–145)

## 2020-08-11 LAB — CBC
HCT: 41.1 % (ref 36.0–46.0)
Hemoglobin: 12.1 g/dL (ref 12.0–15.0)
MCH: 25.3 pg — ABNORMAL LOW (ref 26.0–34.0)
MCHC: 29.4 g/dL — ABNORMAL LOW (ref 30.0–36.0)
MCV: 86 fL (ref 80.0–100.0)
Platelets: 369 10*3/uL (ref 150–400)
RBC: 4.78 MIL/uL (ref 3.87–5.11)
RDW: 19.6 % — ABNORMAL HIGH (ref 11.5–15.5)
WBC: 13.6 10*3/uL — ABNORMAL HIGH (ref 4.0–10.5)
nRBC: 0 % (ref 0.0–0.2)

## 2020-08-11 LAB — LACTIC ACID, PLASMA
Lactic Acid, Venous: 2.2 mmol/L (ref 0.5–1.9)
Lactic Acid, Venous: 2.9 mmol/L (ref 0.5–1.9)

## 2020-08-11 LAB — PROCALCITONIN: Procalcitonin: 0.15 ng/mL

## 2020-08-11 MED ORDER — ENSURE ENLIVE PO LIQD
237.0000 mL | Freq: Three times a day (TID) | ORAL | Status: DC
  Administered 2020-08-11 – 2020-08-17 (×13): 237 mL via ORAL

## 2020-08-11 MED ORDER — ADULT MULTIVITAMIN W/MINERALS CH
1.0000 | ORAL_TABLET | Freq: Every day | ORAL | Status: DC
  Administered 2020-08-11 – 2020-08-13 (×3): 1 via ORAL
  Filled 2020-08-11 (×3): qty 1

## 2020-08-11 MED ORDER — MEGESTROL ACETATE 400 MG/10ML PO SUSP
400.0000 mg | Freq: Two times a day (BID) | ORAL | Status: DC
  Administered 2020-08-11 – 2020-08-17 (×11): 400 mg via ORAL
  Filled 2020-08-11 (×15): qty 10

## 2020-08-11 NOTE — Progress Notes (Signed)
CRITICAL VALUE ALERT  Critical Value:  LA 2.2  Date & Time Notied:  08/11/20 @ 5947  Provider Notified: Dr. Skipper Cliche  Orders Received/Actions taken: Waiting

## 2020-08-11 NOTE — Evaluation (Signed)
Clinical/Bedside Swallow Evaluation Patient Details  Name: [name] MRN: [id number] Date of Birth: [birth year]  Today's Date: 08/11/2020 Time: SLP Start Time (ACUTE ONLY): 0901 SLP Stop Time (ACUTE ONLY): 0921 SLP Time Calculation (min) (ACUTE ONLY): 20 min  Past Medical History:  Past Medical History:  Diagnosis Date  . 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 Cerebrovascular accident (stroke) ([place]) 2005  . Dental caries 01/27/2008  . GERD (gastroesophageal reflux disease) 11/27/2006  . 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 Hypertension 11/23/2006   Past Surgical History:  Past Surgical History:  Procedure Laterality Date  . CESAREAN SECTION    . cyst removed from right side  yrs ago  . EUS N/A 01/07/2014   Procedure: ESOPHAGEAL ENDOSCOPIC ULTRASOUND (EUS) RADIAL;  Surgeon: Arta Silence, MD;  Location: [facility];  Service: Endoscopy;  Laterality: N/A;  . FINE NEEDLE ASPIRATION N/A 01/07/2014   Procedure: FINE NEEDLE ASPIRATION (FNA) LINEAR;  Surgeon: Arta Silence, MD;  Location: [facility];  Service: Endoscopy;  Laterality: N/A;  . TUBAL LIGATION  yrs ago   HPI:  Pt is a 59 y.o. female with medical history significant of multifocal CVA, HTN, hyperhomocystinemia, HLD, GERD, who presented with poor oral intake and confusion. CXR 9/28: Patchy somewhat rounded density in the left mid to lower lung relatively stable. No new focal infiltrate. CT chest: Large cavitary lesion with irregular walls in the left upper lobe abutting the left hilum with associated hilar and mediastinal necrotic adenopathy. 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 BSE 07/24/20: prolonged mastication, probable esophageal dysphagia as pt has history of GERD and noted to have intermittent eructation. A dysphagia 3 diet with thin liquids was recommended at that time.    Assessment / Plan /  Recommendation Clinical Impression  Pt was seen for bedside swallow evaluation. She did not provide any meaningful history and exhibited difficulty following commands. A complete oral mechanism exam was therefore not conducted, but dentition was adequate. She tolerated all solids and liquids without signs or symptoms of aspiration. Mastication was significantly prolonged (i.e., >5 minutes for two pieces of peaches) and moderate oral residue was noted thereafter. Liquid washes were ineffective in clearing residue and residue was ultimately removed with an oral swab. Her swallow function appears more impaired than when she was last seen by SLP on 07/24/20. A dysphagia 1 diet with thin liquids is recommended at this time. SLP will follow to assess tolerance and improvement in swallow function. SLP Visit Diagnosis: Dysphagia, oral phase (R13.11)    Aspiration Risk  Mild aspiration risk    Diet Recommendation Dysphagia 1 (Puree);Thin liquid   Liquid Administration via: Cup;Straw Medication Administration: Crushed with puree Supervision: Full supervision/cueing for compensatory strategies Compensations: Slow rate;Small sips/bites;Follow solids with liquid Postural Changes: Seated upright at 90 degrees;Remain upright for at least 30 minutes after po intake    Other  Recommendations Oral Care Recommendations: Oral care BID;Staff/trained caregiver to provide oral care   Follow up Recommendations Other (comment) (TBD)      Frequency and Duration min 2x/week  2 weeks       Prognosis Prognosis for Safe Diet Advancement: Fair Barriers to Reach Goals: Cognitive deficits      Swallow Study   General Date of Onset: 08/10/20 HPI: Pt is a 59 y.o. female with medical history significant of multifocal CVA, HTN, hyperhomocystinemia, HLD, GERD, who presented with poor oral intake and  confusion. CXR 9/28: Patchy somewhat rounded density in the left mid to lower lung relatively stable. No new focal  infiltrate. CT chest: Large cavitary lesion with irregular walls in the left upper lobe abutting the left hilum with associated hilar and mediastinal necrotic adenopathy. 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 BSE 07/24/20: prolonged mastication, probable esophageal dysphagia as pt has history of GERD and noted to have intermittent eructation. A dysphagia 3 diet with thin liquids was recommended at that time.  Type of Study: Bedside Swallow Evaluation Previous Swallow Assessment: See HPI Diet Prior to this Study: Regular;Thin liquids (But meals have been held) Temperature Spikes Noted: No Respiratory Status: Room air History of Recent Intubation: No Behavior/Cognition: Alert;Cooperative;Pleasant mood Oral Cavity Assessment: Within Functional Limits Oral Care Completed by SLP: No Oral Cavity - Dentition: Missing dentition Vision: Functional for self-feeding Self-Feeding Abilities: Total assist Patient Positioning: Upright in bed Baseline Vocal Quality: Normal Volitional Cough: Cognitively unable to elicit Volitional Swallow: Unable to elicit    Oral/Motor/Sensory Function Overall Oral Motor/Sensory Function: Other (comment) (Pt unable to follow commands)   Ice Chips Ice chips: Within functional limits   Thin Liquid Thin Liquid: Within functional limits Presentation: Straw    Nectar Thick Nectar Thick Liquid: Not tested   Honey Thick Honey Thick Liquid: Not tested   Puree Puree: Within functional limits Presentation: Spoon   Solid     Solid: Impaired Oral Phase Impairments: Impaired mastication Oral Phase Functional Implications: Prolonged oral transit;Impaired mastication     Vinicio Lynk I. Hardin Negus, [place], [place] Office number [phone] Pager [phone]  Horton Marshall 08/11/2020,9:33 AM

## 2020-08-11 NOTE — Progress Notes (Signed)
PROGRESS NOTE    Patient: [name]                            PCP: London Pepper, MD                    DOB: 1961/06/15            DOA: 08/09/2020 FMB:[id number]             DOS: 08/11/2020, 12:21 PM   LOS: 1 day   Date of Service: The patient was seen and examined on 08/11/2020  Subjective:   The patient was seen and examined this Am. Awake follow commands, aphasic post recent stroke, hemiplegic. No new focal neurological findings Hemodynamically stable this a.m.  Brief Narrative:   HPI:  [name] is a 59 y.o. f w PMH/o  multifocal CVA recently, HTN, hyperhomocystinemia, HLD, GERD, presented with poor oral intake and confusion, cachexia. Patient presented with confusion, history provided by husband. Status post recent hospitalization for stroke, LLL  Pneumonia... 3 weeks ago, status post stroke work-up, treated with 3 days  antibiotics. Post discharge patient has been deteriorating, poor p.o. intake, weight loss... Now she has stopped eating altogether.   Husband report of No cough or choking.    But she has been progressively more confused, stopped talking and eating, unable to stand  Furthermore, husband reported the patient has been losing weight since last year, and he was shocked when taking patient to PCP for post hospitalization visit 5 days ago and was told patient weight was only 80 pounds. ED evaluation patient was confused, hemodynamically stable sodium 130 3K2.5 albumin 1.9, chest x-ray left middle lobe infiltrate, CT: New large cavitary lesion left upper lobe, mediastinal necrotic adenopathy consistent with squamous cell carcinoma.  Patient was admitted PCCM consulted.    Assessment & Plan:   Active Problems:   Sepsis ([place]) Large left upper lobe cavitary lesion,  Mediastinal necrotic adenopathy  -consistent with squamous cell carcinoma Weight loss/cachexia Status post recent stroke, progressive weakness   Sepsis secondary to left lung pneumonia  obstructive versus healthcare acquired -Currently hemodynamically stable, sepsis physiology improving -Continue IV fluid resuscitation, current broad-spectrum antibiotics -cefepime, vancomycin, Flagyl  -On admission met the sepsis criteria due to hyper natremia, leukocytosis, and organ damage, acute encephalopathy   -Agreed with broad spectrum coverage for now given the cavity/necrotizing feature, vancomycin cefepime and Flagyl -Pulmonology-consulted and following, appreciate input     Left lung cancer -  -Based on CT finding, left upper lobe cavitary lesion, necrotic mediastinal lymphadenopathy consistent with squamous cell carcinoma -On admission was discussed with pulmonology, following closely   - Overall prognosis very poor, husband however still desire patient remain on full code.  Will discuss above patient's new finding with patient once her mentation improved.  Her nutrition status is a obvious barrier for further cancer treatment.  Husband made aware, will discuss with patient husband and patient herself and then probably introduced to him with palliative care.  Severe hypokalemia /dehydration/hypernatremia Potassium 2.5 >>> 4.2 Sodium 152, 150 -Likely due to poor nutrition, p.o. intake -IV and p.o. replacement -Check magnesium and phosphorus -Continue IV fluids   Acute metabolic encephalopathy -Multifocal, including sepsis, hypernatremia -Patient mental status is improved, awake, dysphasic -Continue IV fluids, treatment of sepsis, hypernatremia  Hypernatremia -Calculated free water deficit 3.0 L -We will continue with half normal saline, and banana bag was given on admission  Cachexia/severe protein calorie  malnutrition Significant weight loss -No signs of dysphagia or aspiration -Speech reconsulted for evaluation -Nutrition consulted  -Ordered protein shake, trial of Marinol, consult nutrition -Discussed with patient husband regarding feeding tube, Will  discuss further with the patient and her husband, at this point we do not recommend PEG tube placement -But tube feed may be beneficial through [place]  Status post treatment with banana bag -ruling out thiamine deficiency Adding Megace-for appetite stimulant  Prolonged QTC -Hold BP meds -Monitoring closely, replacing electrolytes  Recent CVA -Seems to be a independent event, as the multifocal DVA found on MRI last time did not report any bleeding or edema which seemed argue against metastatic or cancer embolic etiology. -Repeat CT of the head was reviewed as follows: IMPRESSION: 1. Continued evolution of left anterior frontal lobe infarct. Mild heterogeneity reflects mild petechial hemorrhage, no malignant hemorrhagic transformation or signs of mass effect. 2. Extensive RIGHT parietal and temporal encephalomalacia. 3. Atrophy and chronic microvascular ischemic change.     Nutritional status:  Nutrition Problem: Inadequate oral intake Etiology: lethargy/confusion Signs/Symptoms: meal completion < 50% Interventions: Ensure Enlive (each supplement provides 350kcal and 20 grams of protein)   Antibiotics: 9/28/2021cefepime, vancomycin, Flagyl  Cultures: 08/10/2020 blood cultures x2 >>   ---------------------------------------------------------------------------------------------------------------------------------------------   DVT prophylaxis: Lovenox Code Status: Full code Family Communication: Husband over phone -attempt will be made to update patient's husband  Consults called: Pulmonology   Ethics the above findings and plan of care has been discussed with patient, husband in detail,  they expressed understanding and agreement of above. -Advance care planning has been discussed.   Admission status:    Status is: Inpatient  Remains inpatient appropriate because:Inpatient level of care appropriate due to severity of illness, sepsis, hemodynamic instability, new  large lung mass   Dispo: The patient is from: Home              Anticipated d/c is to: HOME vs SNF              Anticipated d/c date is: > 3 days              Patient currently is not medically stable to d/c.        Procedures:   No admission procedures for hospital encounter.    Antimicrobials:  Anti-infectives (From admission, onward)   Start     Dose/Rate Route Frequency Ordered Stop   08/11/20 1300  vancomycin (VANCOREADY) IVPB 500 mg/100 mL        500 mg 100 mL/hr over 60 Minutes Intravenous Every 24 hours 08/10/20 1158     08/10/20 2300  ceFEPIme (MAXIPIME) 2 g in sodium chloride 0.9 % 100 mL IVPB        2 g 200 mL/hr over 30 Minutes Intravenous Every 12 hours 08/10/20 1158     08/10/20 1900  metroNIDAZOLE (FLAGYL) IVPB 500 mg        500 mg 100 mL/hr over 60 Minutes Intravenous Every 8 hours 08/10/20 1348     08/10/20 1000  vancomycin (VANCOREADY) IVPB 750 mg/150 mL        750 mg 150 mL/hr over 60 Minutes Intravenous  Once 08/10/20 0946 08/10/20 1411   08/10/20 0945  ceFEPIme (MAXIPIME) 2 g in sodium chloride 0.9 % 100 mL IVPB        2 g 200 mL/hr over 30 Minutes Intravenous  Once 08/10/20 0939 08/10/20 1113   08/10/20 0945  metroNIDAZOLE (FLAGYL) IVPB 500 mg  500 mg 100 mL/hr over 60 Minutes Intravenous  Once 08/10/20 0939 08/10/20 1236   08/10/20 0945  vancomycin (VANCOCIN) IVPB 1000 mg/200 mL premix  Status:  Discontinued        1,000 mg 200 mL/hr over 60 Minutes Intravenous  Once 08/10/20 0939 08/10/20 0946       Medication:  . aspirin EC  81 mg Oral Daily  . atorvastatin  80 mg Oral Daily  . dronabinol  2.5 mg Oral BID AC  . enoxaparin (LOVENOX) injection  30 mg Subcutaneous Q24H  . feeding supplement (ENSURE ENLIVE)  237 mL Oral TID BM  . lisinopril  20 mg Oral q morning - 10a  . megestrol  400 mg Oral BID  . multivitamin with minerals  1 tablet Oral Daily  . pantoprazole  40 mg Oral Daily    acetaminophen **OR** acetaminophen, ondansetron  **OR** ondansetron (ZOFRAN) IV   Objective:   Vitals:   08/11/20 0046 08/11/20 0230 08/11/20 0443 08/11/20 0846  BP: (!) 144/79 (!) 140/106 (!) 158/86 (!) 158/83  Pulse: 89  90 91  Resp: $Remo'20  18 20  'EYydC$ Temp: 97.9 F (36.6 C)  97.7 F (36.5 C) 97.9 F (36.6 C)  TempSrc: Axillary  Axillary Axillary  SpO2: 100%  100% 100%  Weight: 37 kg  36.6 kg     Intake/Output Summary (Last 24 hours) at 08/11/2020 1221 Last data filed at 08/10/2020 1611 Gross per 24 hour  Intake 1307.96 ml  Output --  Net 1307.96 ml   Filed Weights   08/11/20 0046 08/11/20 0443  Weight: 37 kg 36.6 kg     Examination:   Physical Exam  Constitution:  Alert, cooperative, no distress,  Appears calm and comfortable  Psychiatric: Normal and stable mood and affect, cognition intact,   HEENT: Normocephalic, PERRL, otherwise with in Normal limits  Chest:Chest symmetric Cardio vascular:  S1/S2, RRR, No murmure, No Rubs or Gallops  pulmonary: Clear to auscultation bilaterally, respirations unlabored, negative wheezes / crackles Abdomen: Soft, non-tender, non-distended, bowel sounds,no masses, no organomegaly Muscular skeletal: Limited exam - in bed, able to move all 4 extremities, Normal strength,  Neuro: CNII-XII intact. , normal motor and sensation, reflexes intact  Extremities: No pitting edema lower extremities, +2 pulses  Skin: Dry, warm to touch, negative for any Rashes, No open wounds Wounds: per nursing documentation    ------------------------------------------------------------------------------------------------------------------------------------------    LABs:  CBC Latest Ref Rng & Units 08/10/2020 07/26/2020 07/25/2020  WBC 4.0 - 10.5 K/uL 16.1(H) 15.8(H) 15.6(H)  Hemoglobin 12.0 - 15.0 g/dL 11.9(L) 8.5(L) 7.1(L)  Hematocrit 36 - 46 % 40.0 26.9(L) 23.7(L)  Platelets 150 - 400 K/uL 470(H) 468(H) 497(H)   CMP Latest Ref Rng & Units 08/10/2020 08/10/2020 07/26/2020  Glucose 70 - 99 mg/dL 156(H) 143(H)  97  BUN 8 - 23 mg/dL 37(H) 39(H) <5(L)  Creatinine 0.44 - 1.00 mg/dL 0.84 0.80 0.67  Sodium 135 - 145 mmol/L 150(H) 152(H) 135  Potassium 3.5 - 5.1 mmol/L 4.2 2.5(LL) 3.5  Chloride 98 - 111 mmol/L 110 104 106  CO2 22 - 32 mmol/L 26 32 20(L)  Calcium 8.9 - 10.3 mg/dL 8.9 10.3 8.1(L)  Total Protein 6.5 - 8.1 g/dL - 7.9 6.0(L)  Total Bilirubin 0.3 - 1.2 mg/dL - 0.8 0.4  Alkaline Phos 38 - 126 U/L - 89 78  AST 15 - 41 U/L - 30 10(L)  ALT 0 - 44 U/L - 17 8       Micro Results Recent Results (  from the past 240 hour(s))  Culture, blood (routine x 2)     Status: None (Preliminary result)   Collection Time: 08/10/20  9:40 AM   Specimen: BLOOD  Result Value Ref Range Status   Specimen Description BLOOD LEFT ANTECUBITAL  Final   Special Requests   Final    BOTTLES DRAWN AEROBIC AND ANAEROBIC Blood Culture adequate volume   Culture   Final    NO GROWTH < 24 HOURS Performed at [facility], [address] [place], [place] [address]    Report Status PENDING  Incomplete  Culture, blood (routine x 2)     Status: None (Preliminary result)   Collection Time: 08/10/20  9:45 AM   Specimen: BLOOD  Result Value Ref Range Status   Specimen Description BLOOD LEFT ANTECUBITAL  Final   Special Requests   Final    BOTTLES DRAWN AEROBIC AND ANAEROBIC Blood Culture adequate volume   Culture   Final    NO GROWTH < 24 HOURS Performed at [facility], [place] [address], [place], [place] [address]    Report Status PENDING  Incomplete  Respiratory Panel by RT PCR (Flu A&B, Covid) - Nasopharyngeal Swab     Status: None   Collection Time: 08/10/20 10:24 AM   Specimen: Nasopharyngeal Swab  Result Value Ref Range Status   SARS Coronavirus 2 by RT PCR NEGATIVE NEGATIVE Final    Comment: (NOTE) SARS-CoV-2 target nucleic acids are NOT DETECTED.  The SARS-CoV-2 RNA is generally detectable in upper respiratoy specimens during the acute phase of infection. The lowest concentration of SARS-CoV-2  viral copies this assay can detect is 131 copies/mL. A negative result does not preclude SARS-Cov-2 infection and should not be used as the sole basis for treatment or other patient management decisions. A negative result may occur with  improper specimen collection/handling, submission of specimen other than nasopharyngeal swab, presence of viral mutation(s) within the areas targeted by this assay, and inadequate number of viral copies (<131 copies/mL). A negative result must be combined with clinical observations, patient history, and epidemiological information. The expected result is Negative.  Fact Sheet for Patients:  PinkCheek.be  Fact Sheet for Healthcare Providers:  GravelBags.it  This test is no t yet approved or cleared by the [place] FDA and  has been authorized for detection and/or diagnosis of SARS-CoV-2 by FDA under an Emergency Use Authorization (EUA). This EUA will remain  in effect (meaning this test can be used) for the duration of the COVID-19 declaration under Section 564(b)(1) of the Act, 21 U.S.C. section 360bbb-3(b)(1), unless the authorization is terminated or revoked sooner.     Influenza A by PCR NEGATIVE NEGATIVE Final   Influenza B by PCR NEGATIVE NEGATIVE Final    Comment: (NOTE) The Xpert Xpress SARS-CoV-2/FLU/RSV assay is intended as an aid in  the diagnosis of influenza from Nasopharyngeal swab specimens and  should not be used as a sole basis for treatment. Nasal washings and  aspirates are unacceptable for Xpert Xpress SARS-CoV-2/FLU/RSV  testing.  Fact Sheet for Patients: PinkCheek.be  Fact Sheet for Healthcare Providers: GravelBags.it  This test is not yet approved or cleared by the [place] FDA and  has been authorized for detection and/or diagnosis of SARS-CoV-2 by  FDA under an Emergency Use Authorization (EUA).  This EUA will remain  in effect (meaning this test can be used) for the duration of the  Covid-19 declaration under Section 564(b)(1) of the Act, 21  U.S.C.  section 360bbb-3(b)(1), unless the authorization is  terminated or revoked. Performed at [facility], [place] [address], [place], [place] [address]     Radiology Reports CT Head Wo Contrast  Result Date: 08/10/2020 CLINICAL DATA:  Delirium with altered mental status EXAM: CT HEAD WITHOUT CONTRAST TECHNIQUE: Contiguous axial images were obtained from the base of the skull through the vertex without intravenous contrast. COMPARISON:  07/24/2020 and 07/23/2020 FINDINGS: Brain: Extensive RIGHT parietal and temporal encephalomalacia compatible with prior RIGHT MCA infarct. Signs of recent infarct involving the LEFT anterior frontal lobe with continued evolution of low attenuation. Mild edema without midline shift. Mild heterogeneity may reflect small foci of petechial hemorrhage. No signs of extra-axial fluid. No new area of abnormality. Atrophy and chronic microvascular ischemic change. Vascular: No hyperdense vessel or unexpected calcification. Skull: Normal. Negative for fracture or focal lesion. Sinuses/Orbits: Visualized paranasal sinuses and orbits without acute process. Other: None IMPRESSION: 1. Continued evolution of left anterior frontal lobe infarct. Mild heterogeneity reflects mild petechial hemorrhage, no malignant hemorrhagic transformation or signs of mass effect. 2. Extensive RIGHT parietal and temporal encephalomalacia. 3. Atrophy and chronic microvascular ischemic change. Electronically Signed   By: Zetta Bills M.D.   On: 08/10/2020 11:53   CT Head Wo Contrast  Result Date: 07/23/2020 CLINICAL DATA:  Neuro deficit, acute, stroke suspected. Additional history provided: Sudden onset unsteady gait, stroke 2 years ago affecting right side EXAM: CT HEAD WITHOUT CONTRAST TECHNIQUE: Contiguous axial images were obtained from the  base of the skull through the vertex without intravenous contrast. COMPARISON:  Noncontrast head CT 01/21/2005 FINDINGS: Brain: There is a 3.5 cm focus of cortical/subcortical hypodensity within the anterolateral left frontal lobe consistent with acute/early subacute infarction (for instance as seen on series 3, image 15). There is no significant mass effect at this time. Redemonstrated chronic cortically based infarct predominantly affecting portions of the right temporal and parietal lobes as well as posterior right insula. Chronic infarction changes also affect the posterior right thalamus. Background mild ill-defined hypoattenuation within the cerebral white matter is nonspecific, but consistent with chronic small vessel ischemic disease. There is no acute intracranial hemorrhage. No extra-axial fluid collection. No evidence of intracranial mass. Vascular:No hyperdense vessel.  Atherosclerotic calcifications. Skull: Normal. Negative for fracture or focal lesion. Sinuses/Orbits: Visualized orbits show no acute finding. Minimal scattered paranasal sinus mucosal thickening. No significant mastoid effusion. These results were called by telephone at the time of interpretation on 07/23/2020 at 6:48 pm to provider [facility] , who verbally acknowledged these results. IMPRESSION: 3.5 cm acute/early subacute cortical/subcortical infarct within the anterolateral left frontal lobe. Redemonstrated chronic right MCA vascular territory cortically based infarct. Chronic infarct within the posterior right thalamus. Background mild cerebral white matter chronic small vessel ischemic disease. Electronically Signed   By: Kellie Simmering DO   On: 07/23/2020 18:50   CT Chest W Contrast  Addendum Date: 08/10/2020   ADDENDUM REPORT: 08/10/2020 12:49 ADDENDUM: Additionally scattered thyroid nodules are noted. No followup recommended (ref: J Am Coll Radiol. 2015 Feb;12(2): 143-50). Electronically Signed   By: Inez Catalina M.D.   On:  08/10/2020 12:49   Result Date: 08/10/2020 CLINICAL DATA:  Follow-up masslike lesion on recent chest x-ray EXAM: CT CHEST WITH CONTRAST TECHNIQUE: Multidetector CT imaging of the chest was performed during intravenous contrast administration. CONTRAST:  56mL OMNIPAQUE IOHEXOL 300 MG/ML  SOLN COMPARISON:  Chest x-ray from earlier in the same day as well as 07/23/2020 FINDINGS: Cardiovascular: Thoracic aorta demonstrates some mild  atherosclerotic calcifications although no aneurysmal dilatation is seen. No cardiac enlargement is noted. Coronary calcifications are noted. The pulmonary artery as visualized shows no definitive emboli although some mass effect upon the left main pulmonary artery is noted. Mediastinum/Nodes: Thoracic inlet demonstrates scattered small thyroid nodules the largest of which measures less than 1 cm. There is a large centrally necrotic mass lesion centered in the AP window consistent with lymphadenopathy. This measures at least 5.8 x 4.4 cm in greatest dimension. Additionally necrotic subcarinal adenopathy is noted measuring 3.1 by 3.3 cm. Scattered smaller nodes are seen. The trachea and esophagus are significantly deviated to the right secondary to these changes. Lungs/Pleura: Right lung demonstrates some emphysematous changes although no focal infiltrate or sizable effusion is seen. No parenchymal nodule is noted. In the left lung however, there is a large cavitary lesion centrally with irregular thickened borders which is intimately apposed to the central adenopathy. This also shows erosive changes into the left mainstem bronchus which is significantly compressed by the adjacent adenopathy. Additionally there appears to be in growth of tumor into the pulmonary venous system on the left best visualized on image number 72 of series 3. Small left-sided pleural effusion is noted. Some patchy airspace opacity is noted likely related to central obstructive change. Upper Abdomen: Gallbladder has  been surgically removed. Dilatation of the biliary tree is seen. Scattered calcifications are noted within the pancreas consistent with chronic pancreatitis. No other focal abnormality is noted. Musculoskeletal: Bony structures show no changes of metastatic disease. Degenerative change of the thoracic spine is noted. IMPRESSION: Large cavitary lesion with irregular walls in the left upper lobe abutting the left hilum with associated hilar and mediastinal necrotic adenopathy. 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 Findings of erosive change into the left bronchial tree are noted as well as ingrowth into the left pulmonary vein likely related to tumor thrombus. Some associated left lower lobe infiltrate is noted likely related to central bronchial occlusion. Small left pleural effusion is noted as well. Aortic Atherosclerosis (ICD10-I70.0) and Emphysema (ICD10-J43.9). Electronically Signed: By: Inez Catalina M.D. On: 08/10/2020 11:39   MR ANGIO HEAD WO CONTRAST  Result Date: 07/24/2020 CLINICAL DATA:  Follow-up examination for acute stroke. EXAM: MRI HEAD WITHOUT CONTRAST MRA HEAD WITHOUT CONTRAST MRA NECK WITHOUT AND WITH CONTRAST TECHNIQUE: Multiplanar, multiecho pulse sequences of the brain and surrounding structures were obtained without intravenous contrast. Angiographic images of the Circle of Willis were obtained using MRA technique without intravenous contrast. Angiographic images of the neck were obtained using MRA technique without and with intravenous contrast. Carotid stenosis measurements (when applicable) are obtained utilizing NASCET criteria, using the distal internal carotid diameter as the denominator. CONTRAST:  68mL GADAVIST GADOBUTROL 1 MMOL/ML IV SOLN COMPARISON:  Prior CT from 07/23/2020. FINDINGS: MRI HEAD FINDINGS Brain: Examination mildly degraded by motion artifact. Diffuse prominence of the CSF containing spaces compatible generalized  age-related cerebral atrophy. 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 Associated mild chronic hemosiderin staining within this region. Confluent wedge-shaped area of restricted diffusion measuring up to 3.5 cm in diameter seen involving the anterior left frontal lobe, corresponding with abnormality on prior CT. Finding consistent with and ischemic infarct, acute to early subacute in appearance. Multiple additional scattered acute to early subacute ischemic infarcts seen involving the cortical and subcortical left frontal, parietal, and occipital lobes, somewhat watershed in distribution. Few additional punctate acute to early  subacute ischemic cortical infarct noted involving the right frontal lobe (series 5, images 86, 85). No associated hemorrhage or mass effect about these infarcts. Gray-white matter differentiation otherwise maintained. No acute intracranial hemorrhage. No mass lesion or midline shift. Mild ex vacuo dilatation of the right lateral ventricle related to the chronic right MCA territory infarct. No hydrocephalus. No extra-axial fluid collection. Pituitary gland suprasellar region within normal limits. Vascular: Major intracranial vascular flow voids are maintained. Skull and upper cervical spine: Craniocervical junction within normal limits. Bone marrow signal intensity normal. No scalp soft tissue abnormality. Sinuses/Orbits: Globes and orbital soft tissues within normal limits. Mild scattered mucosal thickening noted within the ethmoidal air cells and maxillary sinuses. Paranasal sinuses are otherwise clear. No mastoid effusion. Inner ear structures grossly normal. Other: None. MRA HEAD FINDINGS ANTERIOR CIRCULATION: Examination mildly degraded by motion. Visualized distal cervical segments of the internal carotid arteries are patent with antegrade flow. Scattered atheromatous irregularity throughout the  carotid siphons without flow-limiting stenosis. ICA termini well perfused. A1 segments patent bilaterally. Normal anterior communicating artery complex. Anterior cerebral arteries patent distally without significant stenosis. No M1 stenosis or occlusion. Negative MCA bifurcations. No proximal MCA branch occlusion. Scattered atheromatous irregularity throughout the distal MCA branches bilaterally. Right MCA branches somewhat attenuated as compared to the left, in keeping with the chronic right MCA territory infarct. POSTERIOR CIRCULATION: Vertebral arteries patent to the vertebrobasilar junction without stenosis. Right vertebral artery slightly dominant. Both picas patent proximally. Basilar mildly irregular but widely patent to its distal aspect without stenosis. Superior cerebral arteries patent bilaterally. 2 mm somewhat funnel shaped outpouching at the origin of the left SCA favored to reflect a vascular infundibulum. Both PCA supplied via the basilar or as well as prominent bilateral posterior communicating arteries. PCAs mildly irregular without proximal high-grade P1 and P2 stenosis. Focal severe distal right P3 stenosis noted (series 1224, image 5). No intracranial aneurysm. MRA NECK FINDINGS AORTIC ARCH: Visualized aortic arch of normal caliber. Bovine branching pattern with common origin of the right brachiocephalic and left common carotid arteries noted. No flow-limiting stenosis about the origin of the great vessels. RIGHT CAROTID SYSTEM: Right common carotid artery widely patent from its origin to the bifurcation. No significant atheromatous irregularity or narrowing about the right bifurcation. Right ICA widely patent distally without stenosis, dissection or occlusion. LEFT CAROTID SYSTEM: Left common carotid artery patent from its origin to the bifurcation without stenosis. Minimal atheromatous irregularity about the left bifurcation without significant stenosis. Left ICA widely patent distally without  stenosis, dissection, or occlusion. VERTEBRAL ARTERIES: Both vertebral arteries arise from the subclavian arteries. No proximal subclavian artery stenosis. Vertebral arteries widely patent within the neck without stenosis, dissection or occlusion. IMPRESSION: MRI HEAD IMPRESSION: 1. Acute to early subacute anterior left frontal lobe infarct, left MCA distribution, corresponding with abnormality from prior CT. No associated hemorrhage or mass effect. 2. Multiple additional acute to early subacute ischemic infarcts involving the left frontal, parietal, and occipital lobes, watershed in distribution. Few additional punctate right cerebral cortical infarcts as above. No associated hemorrhage. 3. Underlying chronic right MCA infarct, with additional remote right thalamic infarct. 4. Underlying age-related cerebral atrophy. MRA HEAD IMPRESSION: 1. Negative intracranial MRA for large vessel occlusion. 2. Mild diffuse atheromatous irregularity throughout the intracranial circulation. No proximal high-grade or correctable stenosis. MRA NECK IMPRESSION: Negative MRA of the neck with wide patency of both carotid artery systems and vertebral arteries. No hemodynamically significant stenosis or other acute vascular abnormality. Electronically Signed   By: Marland Kitchen  Jeannine Boga M.D.   On: 07/24/2020 02:40   MR ANGIO NECK W WO CONTRAST  Result Date: 07/24/2020 CLINICAL DATA:  Follow-up examination for acute stroke. EXAM: MRI HEAD WITHOUT CONTRAST MRA HEAD WITHOUT CONTRAST MRA NECK WITHOUT AND WITH CONTRAST TECHNIQUE: Multiplanar, multiecho pulse sequences of the brain and surrounding structures were obtained without intravenous contrast. Angiographic images of the Circle of Willis were obtained using MRA technique without intravenous contrast. Angiographic images of the neck were obtained using MRA technique without and with intravenous contrast. Carotid stenosis measurements (when applicable) are obtained utilizing NASCET  criteria, using the distal internal carotid diameter as the denominator. CONTRAST:  50mL GADAVIST GADOBUTROL 1 MMOL/ML IV SOLN COMPARISON:  Prior CT from 07/23/2020. FINDINGS: MRI HEAD FINDINGS Brain: Examination mildly degraded by motion artifact. Diffuse prominence of the CSF containing spaces compatible generalized age-related cerebral atrophy. 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 Associated mild chronic hemosiderin staining within this region. Confluent wedge-shaped area of restricted diffusion measuring up to 3.5 cm in diameter seen involving the anterior left frontal lobe, corresponding with abnormality on prior CT. Finding consistent with and ischemic infarct, acute to early subacute in appearance. Multiple additional scattered acute to early subacute ischemic infarcts seen involving the cortical and subcortical left frontal, parietal, and occipital lobes, somewhat watershed in distribution. Few additional punctate acute to early subacute ischemic cortical infarct noted involving the right frontal lobe (series 5, images 86, 85). No associated hemorrhage or mass effect about these infarcts. Gray-white matter differentiation otherwise maintained. No acute intracranial hemorrhage. No mass lesion or midline shift. Mild ex vacuo dilatation of the right lateral ventricle related to the chronic right MCA territory infarct. No hydrocephalus. No extra-axial fluid collection. Pituitary gland suprasellar region within normal limits. Vascular: Major intracranial vascular flow voids are maintained. Skull and upper cervical spine: Craniocervical junction within normal limits. Bone marrow signal intensity normal. No scalp soft tissue abnormality. Sinuses/Orbits: Globes and orbital soft tissues within normal limits. Mild scattered mucosal thickening noted within the ethmoidal air cells and maxillary sinuses. Paranasal sinuses are  otherwise clear. No mastoid effusion. Inner ear structures grossly normal. Other: None. MRA HEAD FINDINGS ANTERIOR CIRCULATION: Examination mildly degraded by motion. Visualized distal cervical segments of the internal carotid arteries are patent with antegrade flow. Scattered atheromatous irregularity throughout the carotid siphons without flow-limiting stenosis. ICA termini well perfused. A1 segments patent bilaterally. Normal anterior communicating artery complex. Anterior cerebral arteries patent distally without significant stenosis. No M1 stenosis or occlusion. Negative MCA bifurcations. No proximal MCA branch occlusion. Scattered atheromatous irregularity throughout the distal MCA branches bilaterally. Right MCA branches somewhat attenuated as compared to the left, in keeping with the chronic right MCA territory infarct. POSTERIOR CIRCULATION: Vertebral arteries patent to the vertebrobasilar junction without stenosis. Right vertebral artery slightly dominant. Both picas patent proximally. Basilar mildly irregular but widely patent to its distal aspect without stenosis. Superior cerebral arteries patent bilaterally. 2 mm somewhat funnel shaped outpouching at the origin of the left SCA favored to reflect a vascular infundibulum. Both PCA supplied via the basilar or as well as prominent bilateral posterior communicating arteries. PCAs mildly irregular without proximal high-grade P1 and P2 stenosis. Focal severe distal right P3 stenosis noted (series 1224, image 5). No intracranial aneurysm. MRA NECK FINDINGS AORTIC ARCH: Visualized aortic arch of normal caliber. Bovine branching pattern with common origin of the right brachiocephalic and left common carotid arteries noted. No flow-limiting stenosis about the origin  of the great vessels. RIGHT CAROTID SYSTEM: Right common carotid artery widely patent from its origin to the bifurcation. No significant atheromatous irregularity or narrowing about the right  bifurcation. Right ICA widely patent distally without stenosis, dissection or occlusion. LEFT CAROTID SYSTEM: Left common carotid artery patent from its origin to the bifurcation without stenosis. Minimal atheromatous irregularity about the left bifurcation without significant stenosis. Left ICA widely patent distally without stenosis, dissection, or occlusion. VERTEBRAL ARTERIES: Both vertebral arteries arise from the subclavian arteries. No proximal subclavian artery stenosis. Vertebral arteries widely patent within the neck without stenosis, dissection or occlusion. IMPRESSION: MRI HEAD IMPRESSION: 1. Acute to early subacute anterior left frontal lobe infarct, left MCA distribution, corresponding with abnormality from prior CT. No associated hemorrhage or mass effect. 2. Multiple additional acute to early subacute ischemic infarcts involving the left frontal, parietal, and occipital lobes, watershed in distribution. Few additional punctate right cerebral cortical infarcts as above. No associated hemorrhage. 3. Underlying chronic right MCA infarct, with additional remote right thalamic infarct. 4. Underlying age-related cerebral atrophy. MRA HEAD IMPRESSION: 1. Negative intracranial MRA for large vessel occlusion. 2. Mild diffuse atheromatous irregularity throughout the intracranial circulation. No proximal high-grade or correctable stenosis. MRA NECK IMPRESSION: Negative MRA of the neck with wide patency of both carotid artery systems and vertebral arteries. No hemodynamically significant stenosis or other acute vascular abnormality. Electronically Signed   By: Rise Mu M.D.   On: 07/24/2020 02:40   MR BRAIN WO CONTRAST  Result Date: 07/24/2020 CLINICAL DATA:  Follow-up examination for acute stroke. EXAM: MRI HEAD WITHOUT CONTRAST MRA HEAD WITHOUT CONTRAST MRA NECK WITHOUT AND WITH CONTRAST TECHNIQUE: Multiplanar, multiecho pulse sequences of the brain and surrounding structures were obtained  without intravenous contrast. Angiographic images of the Circle of Willis were obtained using MRA technique without intravenous contrast. Angiographic images of the neck were obtained using MRA technique without and with intravenous contrast. Carotid stenosis measurements (when applicable) are obtained utilizing NASCET criteria, using the distal internal carotid diameter as the denominator. CONTRAST:  32mL GADAVIST GADOBUTROL 1 MMOL/ML IV SOLN COMPARISON:  Prior CT from 07/23/2020. FINDINGS: MRI HEAD FINDINGS Brain: Examination mildly degraded by motion artifact. Diffuse prominence of the CSF containing spaces compatible generalized age-related cerebral atrophy. 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 Associated mild chronic hemosiderin staining within this region. Confluent wedge-shaped area of restricted diffusion measuring up to 3.5 cm in diameter seen involving the anterior left frontal lobe, corresponding with abnormality on prior CT. Finding consistent with and ischemic infarct, acute to early subacute in appearance. Multiple additional scattered acute to early subacute ischemic infarcts seen involving the cortical and subcortical left frontal, parietal, and occipital lobes, somewhat watershed in distribution. Few additional punctate acute to early subacute ischemic cortical infarct noted involving the right frontal lobe (series 5, images 86, 85). No associated hemorrhage or mass effect about these infarcts. Gray-white matter differentiation otherwise maintained. No acute intracranial hemorrhage. No mass lesion or midline shift. Mild ex vacuo dilatation of the right lateral ventricle related to the chronic right MCA territory infarct. No hydrocephalus. No extra-axial fluid collection. Pituitary gland suprasellar region within normal limits. Vascular: Major intracranial vascular flow voids are maintained. Skull and  upper cervical spine: Craniocervical junction within normal limits. Bone marrow signal intensity normal. No scalp soft tissue abnormality. Sinuses/Orbits: Globes and orbital soft tissues within normal limits. Mild scattered mucosal thickening noted within the ethmoidal air cells and maxillary  sinuses. Paranasal sinuses are otherwise clear. No mastoid effusion. Inner ear structures grossly normal. Other: None. MRA HEAD FINDINGS ANTERIOR CIRCULATION: Examination mildly degraded by motion. Visualized distal cervical segments of the internal carotid arteries are patent with antegrade flow. Scattered atheromatous irregularity throughout the carotid siphons without flow-limiting stenosis. ICA termini well perfused. A1 segments patent bilaterally. Normal anterior communicating artery complex. Anterior cerebral arteries patent distally without significant stenosis. No M1 stenosis or occlusion. Negative MCA bifurcations. No proximal MCA branch occlusion. Scattered atheromatous irregularity throughout the distal MCA branches bilaterally. Right MCA branches somewhat attenuated as compared to the left, in keeping with the chronic right MCA territory infarct. POSTERIOR CIRCULATION: Vertebral arteries patent to the vertebrobasilar junction without stenosis. Right vertebral artery slightly dominant. Both picas patent proximally. Basilar mildly irregular but widely patent to its distal aspect without stenosis. Superior cerebral arteries patent bilaterally. 2 mm somewhat funnel shaped outpouching at the origin of the left SCA favored to reflect a vascular infundibulum. Both PCA supplied via the basilar or as well as prominent bilateral posterior communicating arteries. PCAs mildly irregular without proximal high-grade P1 and P2 stenosis. Focal severe distal right P3 stenosis noted (series 1224, image 5). No intracranial aneurysm. MRA NECK FINDINGS AORTIC ARCH: Visualized aortic arch of normal caliber. Bovine branching pattern with  common origin of the right brachiocephalic and left common carotid arteries noted. No flow-limiting stenosis about the origin of the great vessels. RIGHT CAROTID SYSTEM: Right common carotid artery widely patent from its origin to the bifurcation. No significant atheromatous irregularity or narrowing about the right bifurcation. Right ICA widely patent distally without stenosis, dissection or occlusion. LEFT CAROTID SYSTEM: Left common carotid artery patent from its origin to the bifurcation without stenosis. Minimal atheromatous irregularity about the left bifurcation without significant stenosis. Left ICA widely patent distally without stenosis, dissection, or occlusion. VERTEBRAL ARTERIES: Both vertebral arteries arise from the subclavian arteries. No proximal subclavian artery stenosis. Vertebral arteries widely patent within the neck without stenosis, dissection or occlusion. IMPRESSION: MRI HEAD IMPRESSION: 1. Acute to early subacute anterior left frontal lobe infarct, left MCA distribution, corresponding with abnormality from prior CT. No associated hemorrhage or mass effect. 2. Multiple additional acute to early subacute ischemic infarcts involving the left frontal, parietal, and occipital lobes, watershed in distribution. Few additional punctate right cerebral cortical infarcts as above. No associated hemorrhage. 3. Underlying chronic right MCA infarct, with additional remote right thalamic infarct. 4. Underlying age-related cerebral atrophy. MRA HEAD IMPRESSION: 1. Negative intracranial MRA for large vessel occlusion. 2. Mild diffuse atheromatous irregularity throughout the intracranial circulation. No proximal high-grade or correctable stenosis. MRA NECK IMPRESSION: Negative MRA of the neck with wide patency of both carotid artery systems and vertebral arteries. No hemodynamically significant stenosis or other acute vascular abnormality. Electronically Signed   By: Jeannine Boga M.D.   On:  07/24/2020 02:40   DG Chest Portable 1 View  Result Date: 08/10/2020 CLINICAL DATA:  Sepsis EXAM: PORTABLE CHEST 1 VIEW COMPARISON:  07/23/2020 FINDINGS: Cardiac shadow is stable. Mild elevation of left hemidiaphragm is again seen. Patchy somewhat rounded density is again seen within the right lung. No new focal infiltrate is seen. No sizable effusion is noted. IMPRESSION: Patchy somewhat rounded density in the left mid to lower lung relatively stable from the prior exam. Although persistent pneumonia deserves consideration the possibility of neoplasm remains. CT of the chest with contrast is recommended for further evaluation. Electronically Signed   By: Inez Catalina M.D.   On: 08/10/2020  10:01   DG Chest Port 1 View  Result Date: 07/23/2020 CLINICAL DATA:  Sepsis EXAM: PORTABLE CHEST 1 VIEW COMPARISON:  01/19/2005 FINDINGS: Single frontal view of the chest demonstrates dense consolidation at the left lung base compatible with pneumonia or aspiration. Diffuse interstitial prominence elsewhere within the lungs likely reflects scarring. No effusion or pneumothorax. There is elevation of the left hemidiaphragm. The cardiac silhouette is unremarkable. IMPRESSION: 1. Dense left basilar consolidation, compatible with pneumonia or aspiration. Followup PA and lateral chest X-ray is recommended in 3-4 weeks following trial of antibiotic therapy to ensure resolution and exclude underlying malignancy. Electronically Signed   By: Randa Ngo M.D.   On: 07/23/2020 16:55   VAS [place] TRANSCRANIAL DOPPLER W BUBBLES  Result Date: 07/27/2020  Transcranial Doppler with Bubble Indications: Stroke. Performing Technologist: Abram Sander RVS  Examination Guidelines: A complete evaluation includes B-mode imaging, spectral Doppler, color Doppler, and power Doppler as needed of all accessible portions of each vessel. Bilateral testing is considered an integral part of a complete examination. Limited examinations for reoccurring  indications may be performed as noted.  Summary: No HITS at rest or during Valsalva. Negative transcranial Doppler Bubble study with no evidence of right to left intracardiac communication.  A vascular evaluation was performed. The right Ophthalmic was studied. An IV was inserted into the patient's left forearm . Verbal informed consent was obtained.  Negative TCD Bubble study *See table(s) above for TCD measurements and observations.  Diagnosing physician: Antony Contras MD Electronically signed by Antony Contras MD on 07/27/2020 at 12:34:04 PM.    Final    ECHOCARDIOGRAM COMPLETE BUBBLE STUDY  Result Date: 07/24/2020    ECHOCARDIOGRAM REPORT   Patient Name:   TERETHA CHALUPA Date of Exam: 07/24/2020 Medical Rec #:  [id number]     Height:       62.3 in Accession #:    [id number]    Weight:       132.0 lb Date of Birth:  July 30, 1961     BSA:          1.607 m Patient Age:    59 years      BP:           108/67 mmHg Patient Gender: F             HR:           104 bpm. Exam Location:  Inpatient Procedure: 2D Echo and Saline Contrast Bubble Study Indications:    stroke 434.91  History:        Patient has prior history of Echocardiogram examinations, most                 recent 01/18/2005.  Sonographer:    Johny Chess Referring Phys: [id number] [place]  1. Left ventricular ejection fraction, by estimation, is 60 to 65%. The left ventricle has normal function. The left ventricle has no regional wall motion abnormalities. Left ventricular diastolic parameters were normal.  2. Right ventricular systolic function is normal. The right ventricular size is normal. There is normal pulmonary artery systolic pressure.  3. The mitral valve is normal in structure. Trivial mitral valve regurgitation. No evidence of mitral stenosis.  4. The aortic valve was not well visualized. Aortic valve regurgitation is not visualized. Mild to moderate aortic valve sclerosis/calcification is present, without any evidence of aortic  stenosis.  5. The inferior vena cava is normal in size with greater than 50% respiratory variability, suggesting right atrial pressure of  3 mmHg.  6. Agitated saline contrast bubble study was negative, with no evidence of any interatrial shunt. FINDINGS  Left Ventricle: Left ventricular ejection fraction, by estimation, is 60 to 65%. The left ventricle has normal function. The left ventricle has no regional wall motion abnormalities. The left ventricular internal cavity size was normal in size. There is  no left ventricular hypertrophy. Left ventricular diastolic parameters were normal. Normal left ventricular filling pressure. Right Ventricle: The right ventricular size is normal. No increase in right ventricular wall thickness. Right ventricular systolic function is normal. There is normal pulmonary artery systolic pressure. The tricuspid regurgitant velocity is 2.39 m/s, and  with an assumed right atrial pressure of 3 mmHg, the estimated right ventricular systolic pressure is 24.0 mmHg. Left Atrium: Left atrial size was normal in size. Right Atrium: Right atrial size was normal in size. Pericardium: There is no evidence of pericardial effusion. Mitral Valve: The mitral valve is normal in structure. Mild mitral annular calcification. Trivial mitral valve regurgitation. No evidence of mitral valve stenosis. Tricuspid Valve: The tricuspid valve is normal in structure. Tricuspid valve regurgitation is trivial. No evidence of tricuspid stenosis. Aortic Valve: The aortic valve was not well visualized. Aortic valve regurgitation is not visualized. Mild to moderate aortic valve sclerosis/calcification is present, without any evidence of aortic stenosis. Pulmonic Valve: The pulmonic valve was normal in structure. Pulmonic valve regurgitation is not visualized. No evidence of pulmonic stenosis. Aorta: The aortic root is normal in size and structure. Venous: The inferior vena cava is normal in size with greater than 50%  respiratory variability, suggesting right atrial pressure of 3 mmHg. IAS/Shunts: No atrial level shunt detected by color flow Doppler. Agitated saline contrast was given intravenously to evaluate for intracardiac shunting. Agitated saline contrast bubble study was negative, with no evidence of any interatrial shunt. There  is no evidence of a patent foramen ovale. There is no evidence of an atrial septal defect.  LEFT VENTRICLE PLAX 2D LVIDd:         3.60 cm  Diastology LVIDs:         2.40 cm  LV e' medial:    10.40 cm/s LV PW:         1.00 cm  LV E/e' medial:  7.4 LV IVS:        0.90 cm  LV e' lateral:   10.70 cm/s LVOT diam:     2.10 cm  LV E/e' lateral: 7.2 LVOT Area:     3.46 cm  RIGHT VENTRICLE             IVC RV S prime:     13.20 cm/s  IVC diam: 1.10 cm TAPSE (M-mode): 1.7 cm LEFT ATRIUM             Index       RIGHT ATRIUM          Index LA diam:        3.00 cm 1.87 cm/m  RA Area:     8.63 cm LA Vol (A2C):   26.5 ml 16.49 ml/m RA Volume:   15.70 ml 9.77 ml/m LA Vol (A4C):   26.9 ml 16.74 ml/m LA Biplane Vol: 26.5 ml 16.49 ml/m   AORTA Ao Asc diam: 3.00 cm MITRAL VALVE               TRICUSPID VALVE MV Area (PHT): 3.53 cm    TR Peak grad:   22.8 mmHg MV Decel Time: 215 msec  TR Vmax:        239.00 cm/s MV E velocity: 77.10 cm/s MV A velocity: 78.00 cm/s  SHUNTS MV E/A ratio:  0.99        Systemic Diam: 2.10 cm Fransico Him MD Electronically signed by Fransico Him MD Signature Date/Time: 07/24/2020/4:09:20 PM    Final    VAS [place] LOWER EXTREMITY VENOUS (DVT)  Result Date: 07/27/2020  Lower Venous DVTStudy Indications: Stroke.  Comparison Study: no prior Performing Technologist: Abram Sander RVS  Examination Guidelines: A complete evaluation includes B-mode imaging, spectral Doppler, color Doppler, and power Doppler as needed of all accessible portions of each vessel. Bilateral testing is considered an integral part of a complete examination. Limited examinations for reoccurring indications may be  performed as noted. The reflux portion of the exam is performed with the patient in reverse Trendelenburg.  +---------+---------------+---------+-----------+----------+--------------+ RIGHT    CompressibilityPhasicitySpontaneityPropertiesThrombus Aging +---------+---------------+---------+-----------+----------+--------------+ CFV      Full           Yes      Yes                                 +---------+---------------+---------+-----------+----------+--------------+ SFJ      Full                                                        +---------+---------------+---------+-----------+----------+--------------+ FV Prox  Full                                                        +---------+---------------+---------+-----------+----------+--------------+ FV Mid   Full                                                        +---------+---------------+---------+-----------+----------+--------------+ FV DistalFull                                                        +---------+---------------+---------+-----------+----------+--------------+ PFV      Full                                                        +---------+---------------+---------+-----------+----------+--------------+ POP      Full           Yes      Yes                                 +---------+---------------+---------+-----------+----------+--------------+ PTV      Full                                                        +---------+---------------+---------+-----------+----------+--------------+  PERO     Full                                                        +---------+---------------+---------+-----------+----------+--------------+   +---------+---------------+---------+-----------+----------+--------------+ LEFT     CompressibilityPhasicitySpontaneityPropertiesThrombus Aging +---------+---------------+---------+-----------+----------+--------------+ CFV      Full            Yes      Yes                                 +---------+---------------+---------+-----------+----------+--------------+ SFJ      Full                                                        +---------+---------------+---------+-----------+----------+--------------+ FV Prox  Full                                                        +---------+---------------+---------+-----------+----------+--------------+ FV Mid   Full                                                        +---------+---------------+---------+-----------+----------+--------------+ FV DistalFull                                                        +---------+---------------+---------+-----------+----------+--------------+ PFV      Full                                                        +---------+---------------+---------+-----------+----------+--------------+ POP      Full           Yes      Yes                                 +---------+---------------+---------+-----------+----------+--------------+ PTV      Full                                                        +---------+---------------+---------+-----------+----------+--------------+ PERO     Full                                                        +---------+---------------+---------+-----------+----------+--------------+  Summary: BILATERAL: - No evidence of deep vein thrombosis seen in the lower extremities, bilaterally. - No evidence of superficial venous thrombosis in the lower extremities, bilaterally. -   *See table(s) above for measurements and observations. Electronically signed by Curt Jews MD on 07/27/2020 at 5:07:19 PM.    Final     SIGNED: Deatra James, MD, FACP, FHM. [facility],  Pager (please use [email or web address] to page/text)  If 7PM-7AM, please contact night-coverage [email or web address] [place] 08/11/2020, 12:21 PM

## 2020-08-11 NOTE — Progress Notes (Signed)
Initial Nutrition Assessment  DOCUMENTATION CODES:   Underweight  INTERVENTION:    Ensure Enlive po TID, each supplement provides 350 kcal and 20 grams of protein.  MVI daily.  NUTRITION DIAGNOSIS:   Inadequate oral intake related to lethargy/confusion as evidenced by meal completion < 50%.  GOAL:   Patient will meet greater than or equal to 90% of their needs  MONITOR:   PO intake, Supplement acceptance  REASON FOR ASSESSMENT:   Consult Assessment of nutrition requirement/status  ASSESSMENT:   59 yo female admitted with sepsis, PNA, hypothermia. PMH includes recent multifocal CVA, HTN, hyperhomocysteinemia, HLD, GERD.   Patient remains confused this morning. S/P bedside swallow evaluation with SLP this morning. Diet downgraded to dysphagia 1 with thin liquids. Intake of meals has not been recorded. Suspect intake will be very poor.   Weight encounters reviewed. Patient weighed 56.5 kg 07/25/20. Currently 36.6 kg. 35% weight loss is significant. Patient is most likely malnourished, given BMI=15.3 with recent poor oral intake and reported weight loss. Unable to obtain enough information at this time for identification of malnutrition.   Patient has a cavitary mass with necrotic mediastinal hilar adenopathy, suspected lung cancer. She is currently a DNR. Medical team is discussing possibility of hospice care when patient is d/c home.   Labs reviewed. Sodium 150  Medications reviewed and include Marinol.   NUTRITION - FOCUSED PHYSICAL EXAM:  unable to complete  Diet Order:   Diet Order            DIET - DYS 1 Room service appropriate? No; Fluid consistency: Thin  Diet effective now                 EDUCATION NEEDS:   Not appropriate for education at this time  Skin:  Skin Assessment: Reviewed RN Assessment  Last BM:  no BM documented this admission  Height:   Ht Readings from Last 1 Encounters:  08/05/20 5\' 1"  (1.549 m)    Weight:   Wt Readings  from Last 1 Encounters:  08/11/20 36.6 kg    Ideal Body Weight:  47.7 kg  BMI:  Body mass index is 15.25 kg/m.  Estimated Nutritional Needs:   Kcal:  1300-1500  Protein:  60-70 gm  Fluid:  >/= 1.5 L    Lucas Mallow, RD, LDN, CNSC Please refer to Amion for contact information.

## 2020-08-12 DIAGNOSIS — Z515 Encounter for palliative care: Secondary | ICD-10-CM

## 2020-08-12 DIAGNOSIS — R918 Other nonspecific abnormal finding of lung field: Secondary | ICD-10-CM

## 2020-08-12 DIAGNOSIS — Z7189 Other specified counseling: Secondary | ICD-10-CM

## 2020-08-12 DIAGNOSIS — E43 Unspecified severe protein-calorie malnutrition: Secondary | ICD-10-CM

## 2020-08-12 DIAGNOSIS — I639 Cerebral infarction, unspecified: Secondary | ICD-10-CM

## 2020-08-12 LAB — CBC
HCT: 45 % (ref 36.0–46.0)
Hemoglobin: 13.2 g/dL (ref 12.0–15.0)
MCH: 25 pg — ABNORMAL LOW (ref 26.0–34.0)
MCHC: 29.3 g/dL — ABNORMAL LOW (ref 30.0–36.0)
MCV: 85.4 fL (ref 80.0–100.0)
Platelets: 310 10*3/uL (ref 150–400)
RBC: 5.27 MIL/uL — ABNORMAL HIGH (ref 3.87–5.11)
RDW: 20.4 % — ABNORMAL HIGH (ref 11.5–15.5)
WBC: 12 10*3/uL — ABNORMAL HIGH (ref 4.0–10.5)
nRBC: 0 % (ref 0.0–0.2)

## 2020-08-12 LAB — COMPREHENSIVE METABOLIC PANEL
ALT: 15 U/L (ref 0–44)
AST: 23 U/L (ref 15–41)
Albumin: 1.5 g/dL — ABNORMAL LOW (ref 3.5–5.0)
Alkaline Phosphatase: 77 U/L (ref 38–126)
Anion gap: 9 (ref 5–15)
BUN: 19 mg/dL (ref 8–23)
CO2: 20 mmol/L — ABNORMAL LOW (ref 22–32)
Calcium: 8.8 mg/dL — ABNORMAL LOW (ref 8.9–10.3)
Chloride: 115 mmol/L — ABNORMAL HIGH (ref 98–111)
Creatinine, Ser: 0.68 mg/dL (ref 0.44–1.00)
GFR calc Af Amer: >60 mL/min (ref >60)
GFR calc non Af Amer: >60 mL/min (ref >60)
Glucose, Bld: 173 mg/dL — ABNORMAL HIGH (ref 70–99)
Potassium: 4.2 mmol/L (ref 3.5–5.1)
Sodium: 144 mmol/L (ref 135–145)
Total Bilirubin: 0.4 mg/dL (ref 0.3–1.2)
Total Protein: 6.7 g/dL (ref 6.5–8.1)

## 2020-08-12 NOTE — Evaluation (Signed)
Physical Therapy Evaluation Patient Details Name: [name] MRN: [id number] DOB: Mar 01, 1961 Today's Date: 08/12/2020   History of Present Illness  [name] is a 59 y.o. female with a past medical history significant for right MCA stroke in 2006, hypertension, smoking, complicated by the development of anorexia, presenting with worsening weakness, fever felt to be secondary to pneumonia, and anemia. MRI scan of the brain shows by cerebral embolic-looking infarcts with the largest one being in the left anterior frontal cortex with gyriform appearance.   Clinical Impression  PLOF and home set-up collected from prior admission. Pt asleep on entry, with increased verbal and tactile stimulus roused and rolled over onto R side. Pt able to sit EoB with total A. Pt sat EoB for 8 minutes without outside assist. Pt with no command follow or answer to any questions. Pt able to eat a few bites of Magic Cup. Pt returned to supine and she was able to pull blankets up around her. PT currently recommending SNF, however will continue to follow and may be able to progress to home with HHPT and 24 hr/assist. PT will continue to follow acutely.     Follow Up Recommendations SNF;Supervision/Assistance - 24 hour    Equipment Recommendations  Hospital bed;Wheelchair (measurements PT);Wheelchair cushion (measurements PT)       Precautions / Restrictions Precautions Precautions: Fall Precaution Comments: L hemi weakness and decreased LOA Restrictions Weight Bearing Restrictions: No      Mobility  Bed Mobility Overal bed mobility: Needs Assistance Bed Mobility: Rolling Rolling: Min guard Sidelying to sit: HOB elevated;Total assist     Sit to sidelying: Total assist General bed mobility comments: while attempting to wake pt up, she was able to roll onto R side without assist, requires total A for coming to EoB and return to supine.   Transfers                 General transfer comment:  NT                 Balance Overall balance assessment: Needs assistance Sitting-balance support: Feet supported;No upper extremity supported Sitting balance-Leahy Scale: Fair Sitting balance - Comments: able to sit EoB for 8 min without assist                                      Pertinent Vitals/Pain Pain Assessment: Faces Faces Pain Scale: No hurt    Home Living Family/patient expects to be discharged to:: Private residence Living Arrangements: Spouse/significant other Available Help at Discharge: Family;Available 24 hours/day Type of Home: House Home Access: Stairs to enter Entrance Stairs-Rails: None Entrance Stairs-Number of Steps: 2 Home Layout: One level Home Equipment: Walker - 2 wheels Additional Comments: unable to ascertian any additonal equipment at home after last hospitalization.    Prior Function Level of Independence: Needs assistance   Gait / Transfers Assistance Needed: Spouse assisted with transfers.  Need to confirm.  ADL's / Homemaking Assistance Needed: Patient required assist from spouse and family.  Unable to gather level of assist due to lethargy.        Hand Dominance   Dominant Hand: Right    Extremity/Trunk Assessment   Upper Extremity Assessment Upper Extremity Assessment: Difficult to assess due to impaired cognition;Generalized weakness    Lower Extremity Assessment Lower Extremity Assessment: Difficult to assess due to impaired cognition;Generalized weakness       Communication  Communication: Other (comment) (patient decreased LOA)  Cognition Arousal/Alertness: Lethargic Behavior During Therapy: Flat affect Overall Cognitive Status: No family/caregiver present to determine baseline cognitive functioning                                 General Comments: asleep on entry, requires increased verbal and tactile stimulus to rouse      General Comments General comments (skin integrity, edema,  etc.): VSS on RA     Assessment/Plan    PT Assessment Patient needs continued PT services  PT Problem List Decreased strength;Decreased balance;Decreased mobility;Decreased cognition;Decreased safety awareness       PT Treatment Interventions DME instruction;Gait training;Stair training;Functional mobility training;Therapeutic activities;Therapeutic exercise;Balance training;Neuromuscular re-education;Cognitive remediation;Patient/family education    PT Goals (Current goals can be found in the Care Plan section)  Acute Rehab PT Goals Patient Stated Goal: None stated PT Goal Formulation: With patient Time For Goal Achievement: 08/07/20 Potential to Achieve Goals: Good    Frequency Min 3X/week    AM-PAC PT "6 Clicks" Mobility  Outcome Measure Help needed turning from your back to your side while in a flat bed without using bedrails?: None Help needed moving from lying on your back to sitting on the side of a flat bed without using bedrails?: Total Help needed moving to and from a bed to a chair (including a wheelchair)?: Total Help needed standing up from a chair using your arms (e.g., wheelchair or bedside chair)?: Total Help needed to walk in hospital room?: Total Help needed climbing 3-5 steps with a railing? : Total 6 Click Score: 9    End of Session   Activity Tolerance: Patient limited by lethargy Patient left: in bed;with call bell/phone within reach;with bed alarm set Nurse Communication: Mobility status PT Visit Diagnosis: Other abnormalities of gait and mobility (R26.89);Muscle weakness (generalized) (M62.81);Hemiplegia and hemiparesis;Difficulty in walking, not elsewhere classified (R26.2);Other symptoms and signs involving the nervous system (R29.898) Hemiplegia - Right/Left: Right Hemiplegia - dominant/non-dominant: Dominant Hemiplegia - caused by: Cerebral infarction    Time: 3016-0109 PT Time Calculation (min) (ACUTE ONLY): 19 min   Charges:   PT  Evaluation $PT Eval Moderate Complexity: 1 Mod          Venezia Sargeant B. Migdalia Dk PT, DPT Acute Rehabilitation Services Pager [phone] Office [phone]   [place] 08/12/2020, 3:29 PM

## 2020-08-12 NOTE — Evaluation (Signed)
Occupational Therapy Evaluation Patient Details Name: [name] MRN: [id number] DOB: [birth year] Today's Date: 08/12/2020    History of Present Illness [name] is a 59 y.o. female with a past medical history significant for right MCA stroke in 2006, hypertension, smoking, complicated by the development of anorexia, presenting with worsening weakness, fever felt to be secondary to pneumonia, and anemia. MRI scan of the brain shows by cerebral embolic-looking infarcts with the largest one being in the left anterior frontal cortex with gyriform appearance.    Clinical Impression   Patient admitted for the above diagnosis.  Limited assessment this date due to patient's decreased LOA.  Patient unable to keep eyes open, falling asleep.  Patient with recent admission for CVA in mid September.  It appears she did go home with assist as needed from the spouse/family.  OT to continue efforts in the acute setting.  Additional and continued assessment will be necessary to make an appropriate discharge recommendation post acute.  Lab entering the room, OT concluded eval.  OT did speak with patient's nurse tech.  Stated patient has been unable to participate with rolling or light grooming thus far.      Follow Up Recommendations  SNF;Supervision/Assistance - 24 hour    Equipment Recommendations  3 in 1 bedside commode;Tub/shower seat;Hospital bed;Wheelchair cushion (measurements OT);Wheelchair (measurements OT)    Recommendations for Other Services       Precautions / Restrictions Precautions Precautions: Fall Precaution Comments: L hemi weakness and decreased LOA Restrictions Weight Bearing Restrictions: No      Mobility Bed Mobility Overal bed mobility: Needs Assistance Bed Mobility: Rolling Rolling: Max assist         General bed mobility comments: Not safe to attempt EOB sitting given LOA  Transfers                 General transfer comment: NT    Balance                                            ADL either performed or assessed with clinical judgement   ADL                                         General ADL Comments: Unable to assess due to LOA.  Nursing Tech said patient did not engage to wash face or for oral care.     Vision   Additional Comments: Patient did track this therapist and was able to make eye contact.     Perception     Praxis      Pertinent Vitals/Pain Pain Assessment: Faces Faces Pain Scale: Hurts a little bit Pain Location: L arm with range Pain Descriptors / Indicators: Grimacing Pain Intervention(s): Limited activity within patient's tolerance;Repositioned     Hand Dominance Right   Extremity/Trunk Assessment Upper Extremity Assessment Upper Extremity Assessment: Generalized weakness;LUE deficits/detail LUE Deficits / Details: patient did move against gravity, further assessmsnt needed.   Lower Extremity Assessment Lower Extremity Assessment: Defer to PT evaluation       Communication Communication Communication: Other (comment) (patient decreased LOA)   Cognition Arousal/Alertness: Lethargic Behavior During Therapy: Agitated;Flat affect Overall Cognitive Status: No family/caregiver present to determine baseline cognitive functioning  General Comments: unable to maintain LOA   General Comments       Exercises     Shoulder Instructions      Home Living Family/patient expects to be discharged to:: Private residence Living Arrangements: Spouse/significant other Available Help at Discharge: Family;Available 24 hours/day Type of Home: House Home Access: Stairs to enter [employer] of Steps: 2   Home Layout: One level     Bathroom Shower/Tub: Teacher, early years/pre: Standard     Home Equipment: Environmental consultant - 2 wheels   Additional Comments: unable to ascertian any additonal equipment at home  after last hospitalization.      Prior Functioning/Environment Level of Independence: Needs assistance  Gait / Transfers Assistance Needed: Spouse assisted with transfers.  Need to confirm. ADL's / Homemaking Assistance Needed: Patient required assist from spouse and family.  Unable to gather level of assist due to lethargy. Communication / Swallowing Assistance Needed: patient would nod her head to certan questions.  Difficult time maintaining LOA.  Closing eyes and falling asleep.          OT Problem List: Decreased activity tolerance;Decreased cognition;Decreased strength;Impaired UE functional use      OT Treatment/Interventions: Self-care/ADL training;Therapeutic exercise;Energy conservation;DME and/or AE instruction;Therapeutic activities;Cognitive remediation/compensation;Patient/family education;Balance training    OT Goals(Current goals can be found in the care plan section) Acute Rehab OT Goals Patient Stated Goal: None stated OT Goal Formulation: Patient unable to participate in goal setting Time For Goal Achievement: 08/27/20 Potential to Achieve Goals: Fair  OT Frequency: Min 2X/week   Barriers to D/C:    Current Medical status       Co-evaluation              AM-PAC OT "6 Clicks" Daily Activity     Outcome Measure Help from another person eating meals?: Total Help from another person taking care of personal grooming?: Total Help from another person toileting, which includes using toliet, bedpan, or urinal?: Total Help from another person bathing (including washing, rinsing, drying)?: Total Help from another person to put on and taking off regular upper body clothing?: Total Help from another person to put on and taking off regular lower body clothing?: Total 6 Click Score: 6   End of Session    Activity Tolerance: Patient limited by lethargy;Treatment limited secondary to agitation Patient left: in bed;with call bell/phone within reach;with bed alarm  set  OT Visit Diagnosis: Muscle weakness (generalized) (M62.81);Other symptoms and signs involving cognitive function;Pain;Hemiplegia and hemiparesis Hemiplegia - Right/Left: Left Hemiplegia - dominant/non-dominant: Non-Dominant Hemiplegia - caused by: Cerebral infarction Pain - Right/Left: Left Pain - part of body: Arm                Time: 1119-1130 OT Time Calculation (min): 11 min Charges:  OT General Charges $OT Visit: 1 Visit OT Evaluation $OT Eval Moderate Complexity: 1 Mod  08/12/2020  Rich, OTR/L  Acute Rehabilitation Services  Office:  [phone]   Metta Clines 08/12/2020, 11:35 AM

## 2020-08-12 NOTE — Consult Note (Signed)
Consultation Note Date: 08/12/2020   Patient Name: [name]  DOB: 1961/10/27  MRN: [id number]  Age / Sex: 59 y.o., female  PCP: London Pepper, MD Referring Physician: Debbe Odea, MD  Reason for Consultation: Establishing goals of care  HPI/Patient Profile: 59 y.o. female  with past medical history of recent hospitalization following CVA and pneumonia, HTN, hyperhomocystinemia, HLD, GERD admitted on 08/09/2020 with weakness, poor oral intake, weight loss. 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 CT head with extensive right encephalomalacia, atrophy/microvascular changes. Pulmonology consulted. Most likely lung cancer based on imaging but with adult failure tor thrive and hx of CVA's now aphasic, patient would not benefit from bronchoscopy with incurable condition in her current state. Pulmonology recommending palliative consultation for goals of care.   Clinical Assessment and Goals of Care:  I have reviewed medical records, discussed with care team, and met with patient and husband [name]) at bedside to discuss goals of care. Patient will wake to voice but aphasic from recent stroke. She does not interact or participate in conversation. She will occasionally follow commands and does accept an entire ensure from nurse when this NP was at bedside.   I introduced Palliative Medicine as specialized medical care for people living with serious illness. It focuses on providing relief from the symptoms and stress of a serious illness. The goal is to improve quality of life for both the patient and the family.  We discussed a brief life review of the patient. Prior to patient's initial stroke, husband shares she was very active especially with the youth in their church. Suffered her first stroke approximately 5 years ago but was able to  return home and fairly independent. Unfortunately continued to smoke following this stroke. Husband describes her as stubborn. He reports poor oral intake and weight loss especially in the last year, but with her stubbornness, she would get frustrated with his nagging about eating. One daughter from a previous relationship but minimally involved.    Patient recently discharged from the hospital following another stroke and pneumonia. Patient returned home, but husband shares she declined within 5 days of hospitalization with weakness and poor oral intake. Discussed course of hospitalization including diagnoses, interventions, plan of care. Husband is appreciative of conversations with providers. He confirms his understanding that she likely has cancer but is not a candidate for aggressive oncology workup or treatment with her current condition. We discussed her failure to thrive following the stroke and now likely cancer diagnosis. [name] shares that he understands poor prognosis, especially with poor oral intake, and has been preparing.   Husband confirms his conversation with pulmonology and decision for DNR code status. Discussed medical recommendation against feeding tube placement with irreversible conditions and this just prolonging [name]'s suffering. [name] shares that he spoke with the patient's mother and she agreed with DNR and not to place feeding tube, sharing they do not wish for her to live in this current state/like a vegetable.   [name] asks  where we go from here. Compassionately shared recommendation for comfort focused pathway and consideration of hospice services on discharge. Discussed hospice options and philosophy, including emphasis on comfort, quality of life, symptom management, preventing recurrent hospitalization, and allowing nature to take course with her condition. Discussed comfort feeds.   Husband agrees with comfort focused pathway on discharge and considering hospice  options. He is wondering how quickly she will decline with IVF are discontinued with essentially no oral intake (as she declined within 5 days of previous discharge). Donnell and I discussed stopping IVF today and further evaluating [name] tomorrow about discharge options. Either home with hospice or hospice facility placement. [name] agrees with this plan  Discussed and completed MOST form. Decisions include: DNR/DNI, comfort focused pathway on discharge, IVF/ABX for time trial, and NO feeding tube. Durable DNR completed. Electronic [facility] MOST form completed. Copies made for chart and husband.   Questions and concerns were addressed. PMT contact information given. Reassured of f/u in AM.   Discussed with Dr. Wynelle Cleveland who is agreeable with discontinuing IVF. Updated RN.    SUMMARY OF RECOMMENDATIONS    Patient does not have capacity to make medical decisions. Husband is default HCPOA.  Husband understands diagnoses and poor prognosis. Husband confirms decision for DNR code status and NO feeding tube, understanding irreversible conditions. Husband agrees with recommendation against oncology workup with her frailty.   MOST form completed: DNR/DNI, comfort focused pathway on discharge, IVF/ABX for time trial, and NO feeding tube. Durable DNR completed.   Husband considering hospice options. Will stop IVF today and evaluate tomorrow. Patient may quickly decline with essentially no oral intake, therefore would be eligible for inpatient hospice facility. If she stabilizes and oral intake improves, husband would like to take her home with hospice support.  Continue current plan of care and medical management inpatient.   Continue assist and encouragement with meals.   PMT provider f/u in AM with husband regarding hospice decision.   Code Status/Advance Care Planning:  DNR  Symptom Management:   Per attending  Palliative Prophylaxis:   Aspiration, Delirium Protocol, Frequent Pain  Assessment, Oral Care and Turn Reposition  Psycho-social/Spiritual:   Desire for further Chaplaincy support: yes  Additional Recommendations: Caregiving  Support/Resources, Compassionate Wean Education and Education on Hospice  Prognosis:   Poor prognosis  Discharge Planning: To Be Determined      Primary Diagnoses: Present on Admission: . Sepsis ([place]) . Lung mass . 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 Essential hypertension . Anorexia . Acute CVA (cerebrovascular accident) ([place]) . Hypokalemia . Sepsis due to pneumonia ([place]) . Severe protein-calorie malnutrition Altamease Oiler: less than 60% of standard weight) ([place])   I have reviewed the medical record, interviewed the patient and family, and examined the patient. The following aspects are pertinent.  Past Medical History:  Diagnosis Date  . 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 Cerebrovascular accident (stroke) ([place]) 2005  . Dental caries 01/27/2008  . GERD (gastroesophageal reflux disease) 11/27/2006  . 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 Hypertension 11/23/2006   Social History   Socioeconomic History  . Marital status: Married    Spouse name: Not on file  . Number of children: Not on file  . Years of education: Not on file  . Highest education level: Not on file  Occupational History  . Not on file  Tobacco Use  . Smoking status: Former Smoker    Packs/day:  0.25    Years: 20.00    Pack years: 5.00    Types: Cigarettes    Quit date: 11/13/2013    Years since quitting: 6.7  . Smokeless tobacco: Never Used  Substance and Sexual Activity  . Alcohol use: Yes    Comment: occasional beer  . Drug use: No  . Sexual activity: Not on file  Other Topics Concern  . Not on file  Social History Narrative  . Not on file   Social Determinants of Health   Financial Resource Strain:   . Difficulty of Paying Living Expenses: Not on  file  Food Insecurity:   . Worried About Charity fundraiser in the Last Year: Not on file  . Ran Out of Food in the Last Year: Not on file  Transportation Needs:   . Lack of Transportation (Medical): Not on file  . Lack of Transportation (Non-Medical): Not on file  Physical Activity:   . Days of Exercise per Week: Not on file  . Minutes of Exercise per Session: Not on file  Stress:   . Feeling of Stress : Not on file  Social Connections:   . Frequency of Communication with Friends and Family: Not on file  . Frequency of Social Gatherings with Friends and Family: Not on file  . Attends Religious Services: Not on file  . Active Member of Clubs or Organizations: Not on file  . Attends Archivist Meetings: Not on file  . Marital Status: Not on file   Family History  Problem Relation Age of Onset  . Hypertension Mother   . Hypertension Daughter    Scheduled Meds: . aspirin EC  81 mg Oral Daily  . atorvastatin  80 mg Oral Daily  . dronabinol  2.5 mg Oral BID AC  . enoxaparin (LOVENOX) injection  30 mg Subcutaneous Q24H  . feeding supplement (ENSURE ENLIVE)  237 mL Oral TID BM  . lisinopril  20 mg Oral q morning - 10a  . megestrol  400 mg Oral BID  . multivitamin with minerals  1 tablet Oral Daily  . pantoprazole  40 mg Oral Daily   Continuous Infusions: . ceFEPime (MAXIPIME) IV 2 g (08/12/20 1403)  . metronidazole 500 mg (08/12/20 1246)  . vancomycin 500 mg (08/12/20 1505)   PRN Meds:.acetaminophen **OR** acetaminophen, ondansetron **OR** ondansetron (ZOFRAN) IV Medications Prior to Admission:  Prior to Admission medications   Medication Sig Start Date End Date Taking? Authorizing Provider  acetaminophen (TYLENOL) 500 MG tablet Take 500 mg by mouth every 6 (six) hours as needed for moderate pain or headache.   Yes [provider]  aspirin EC 81 MG tablet Take 81 mg by mouth daily. Swallow whole.   Yes [provider]  atorvastatin (LIPITOR) 80 MG  tablet Take 1 tablet (80 mg total) by mouth daily. 07/27/20  Yes Little Ishikawa, MD  Calcium Carbonate Antacid (TUMS E-X PO) Take 1 tablet by mouth daily as needed (stomachache).    Yes [provider]  clopidogrel (PLAVIX) 75 MG tablet Take 1 tablet (75 mg total) by mouth daily. 07/26/20  Yes Little Ishikawa, MD  lisinopril (PRINIVIL,ZESTRIL) 20 MG tablet Take 20 mg by mouth every morning.   Yes [provider]  pantoprazole (PROTONIX) 40 MG tablet Take 40 mg by mouth daily.   Yes [provider]   No Known Allergies Review of Systems  Unable to perform ROS: Mental status change   Physical Exam Vitals and  nursing note reviewed.  Constitutional:      Appearance: She is cachectic. She is ill-appearing.  HENT:     Head: Normocephalic and atraumatic.  Cardiovascular:     Rate and Rhythm: Normal rate.  Pulmonary:     Effort: No tachypnea, accessory muscle usage or respiratory distress.  Skin:    General: Skin is warm and dry.  Neurological:     Mental Status: She is easily aroused.     Comments: Wakes to voice, aphasic and disoriented. Does not engage in discussion. Follows commands.  Psychiatric:        Attention and Perception: She is inattentive.        Speech: Speech is delayed.        Cognition and Memory: Cognition is impaired.    Vital Signs: BP (!) 149/76   Pulse 83   Temp 98.4 F (36.9 C) (Axillary)   Resp 18   Wt 39.1 kg   SpO2 98%   BMI 16.29 kg/m  Pain Scale: Faces   Pain Score: 0-No pain   SpO2: SpO2: 98 % O2 Device:SpO2: 98 % O2 Flow Rate: .   IO: Intake/output summary:   Intake/Output Summary (Last 24 hours) at 08/12/2020 1558 Last data filed at 08/12/2020 1502 Gross per 24 hour  Intake 5546.32 ml  Output --  Net 5546.32 ml    LBM: Last BM Date: 08/12/20 Baseline Weight: Weight: 37 kg Most recent weight: Weight: 39.1 kg     Palliative Assessment/Data: PPS 30%   Flowsheet Rows     Most Recent Value  Intake  Tab  Referral Department Hospitalist  Unit at Time of Referral Med/Surg Unit  Palliative Care Primary Diagnosis Cancer  Palliative Care Type New Palliative care  Reason for referral Clarify Goals of Care  Date first seen by Palliative Care 08/12/20  Clinical Assessment  Palliative Performance Scale Score 30%  Psychosocial & Spiritual Assessment  Palliative Care Outcomes  Patient/Family meeting held? Yes  Who was at the meeting? husband [name])  [place] goals of care, Counseled regarding hospice, Provided end of life care assistance, Improved pain interventions, Improved non-pain symptom therapy, Provided advance care planning, Provided psychosocial or spiritual support, Completed durable DNR, ACP counseling assistance, Transitioned to hospice      Time In: 1200 Time Out: 1320 Time Total: 80 Greater than 50%  of this time was spent counseling and coordinating care related to the above assessment and plan.  Signed by:  Ihor Dow, DNP, FNP-C Palliative Medicine Team  Phone: [phone] Fax: [phone]   Please contact Palliative Medicine Team phone at [phone] for questions and concerns.  For individual provider: See Shea Evans

## 2020-08-12 NOTE — Progress Notes (Signed)
PROGRESS NOTE    [name]   ZOX:[id number]  DOB: May 10, 1961  DOA: 08/09/2020 PCP: London Pepper, MD   Brief Narrative:  [name] is a 59 y.o. 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   She was discharged on 07/26/20 after being treated for a left frontal lobe infarct and few frontal parietal and occipital watershed infarcts which presented as confusion . She as also noted to have fever of 102.9 and was found to have a left basilar pneumonia on CXR. She was sent home with her husband.  Since then she has stopped eating and has become severely weak and more confused.  In the ED she has underwent a CT of the chest and is found to have a left upper lobe cavitary lung lesion.  CT chest:  Large cavitary lesion with irregular walls in the left upper lobe abutting the left hilum with associated hilar and mediastinal necrotic adenopathy. 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 Findings of erosive change into the left bronchial tree are noted as well as ingrowth into the left pulmonary vein likely related to tumor thrombus.  Some associated left lower lobe infiltrate is noted likely related to central bronchial occlusion. Small left pleural effusion is noted as well.  Subjective: Quite confused.     Assessment & Plan:   Principal Problem:   Sepsis likely due to pneumonia  - this is likely a post obstructive pneumonia- cont Cefepime, Vanc and Flagyl- if blood cultures are negative tomorrow, will narrow antibiotics  Active Problems:  Lung mass - due to inability to eat, severe weigth loss and poor activity, pulmonary recommends palliative care rather than a biopsy and treatment - palliative care team has become involved- she is now a DNR- plan for either hospice home or home with  hospice    Acute metabolic encephalopathy - CT head is negative for brain metastasis- MRI not performed but she may have more CVAs or metastasis- as mentioned above, the plan is for comfort care eventually - possibly also secondary to sepsis    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 hospice home vs home with hospice              Anticipated d/c date is: 2 days              Patient currently is not medically stable to d/c.      Consultants:   Pulmonary   Palliative care Procedures:    Antimicrobials:  Anti-infectives (From admission, onward)   Start     Dose/Rate Route Frequency Ordered Stop   08/11/20 1300  vancomycin (VANCOREADY) IVPB 500 mg/100 mL  500 mg 100 mL/hr over 60 Minutes Intravenous Every 24 hours 08/10/20 1158     08/10/20 2300  ceFEPIme (MAXIPIME) 2 g in sodium chloride 0.9 % 100 mL IVPB        2 g 200 mL/hr over 30 Minutes Intravenous Every 12 hours 08/10/20 1158     08/10/20 1900  metroNIDAZOLE (FLAGYL) IVPB 500 mg        500 mg 100 mL/hr over 60 Minutes Intravenous Every 8 hours 08/10/20 1348     08/10/20 1000  vancomycin (VANCOREADY) IVPB 750 mg/150 mL        750 mg 150 mL/hr over 60 Minutes Intravenous  Once 08/10/20 0946 08/10/20 1411   08/10/20 0945  ceFEPIme (MAXIPIME) 2 g in sodium chloride 0.9 % 100 mL IVPB        2 g 200 mL/hr  over 30 Minutes Intravenous  Once 08/10/20 0939 08/10/20 1113   08/10/20 0945  metroNIDAZOLE (FLAGYL) IVPB 500 mg        500 mg 100 mL/hr over 60 Minutes Intravenous  Once 08/10/20 0939 08/10/20 1236   08/10/20 0945  vancomycin (VANCOCIN) IVPB 1000 mg/200 mL premix  Status:  Discontinued        1,000 mg 200 mL/hr over 60 Minutes Intravenous  Once 08/10/20 0939 08/10/20 0946       Objective: Vitals:   08/12/20 0417 08/12/20 0739 08/12/20 1015 08/12/20 1100  BP: 130/75 (!) 155/99 (!) 162/87 (!) 149/76  Pulse: 90 76 83 83  Resp: 13 18  18   Temp: 98 F (36.7 C) 98.2 F (36.8 C)  98.4 F (36.9 C)  TempSrc: Axillary Axillary  Axillary  SpO2: 100% 100% 100% 98%  Weight: 39.1 kg       Intake/Output Summary (Last 24 hours) at 08/12/2020 1603 Last data filed at 08/12/2020 1502 Gross per 24 hour  Intake 5546.32 ml  Output --  Net 5546.32 ml   Filed Weights   08/11/20 0046 08/11/20 0443 08/12/20 0417  Weight: 37 kg 36.6 kg 39.1 kg    Examination: General exam: Appears comfortable  HEENT: PERRLA, oral mucosa moist, no sclera icterus or thrush Respiratory system: Clear to auscultation. Respiratory effort normal. Cardiovascular system: S1 & S2 heard, RRR.   Gastrointestinal system: Abdomen soft, non-tender, nondistended. Normal bowel sounds. Central nervous system:   oriented only to person- sleepy - not following commands Extremities: No cyanosis, clubbing or edema Skin: No rashes or ulcers      Data Reviewed: I have personally reviewed following labs and imaging studies  CBC: Recent Labs  Lab 08/10/20 0906 08/11/20 1409 08/12/20 1124  WBC 16.1* 13.6* 12.0*  NEUTROABS 12.3*  --   --   HGB 11.9* 12.1 13.2  HCT 40.0 41.1 45.0  MCV 83.7 86.0 85.4  PLT 470* 369 381   Basic Metabolic Panel: Recent Labs  Lab 08/10/20 0906 08/10/20 1600 08/10/20 1700 08/11/20 1409  NA 152* 150*  --  145  K 2.5* 4.2  --  4.3  CL 104 110  --  108  CO2 32 26  --  24  GLUCOSE 143*  156*  --  149*  BUN 39* 37*  --  27*  CREATININE 0.80 0.84  --  0.65  CALCIUM 10.3 8.9  --  9.2  MG 2.4  --  2.4  --   PHOS  --   --  2.9  --    GFR: Estimated Creatinine Clearance: 45.6 mL/min (by C-G formula based on SCr  of 0.65 mg/dL). Liver Function Tests: Recent Labs  Lab 08/10/20 0906  AST 30  ALT 17  ALKPHOS 89  BILITOT 0.8  PROT 7.9  ALBUMIN 1.9*   No results for input(s): LIPASE, AMYLASE in the last 168 hours. No results for input(s): AMMONIA in the last 168 hours. Coagulation Profile: No results for input(s): INR, PROTIME in the last 168 hours. Cardiac Enzymes: No results for input(s): CKTOTAL, CKMB, CKMBINDEX, TROPONINI in the last 168 hours. BNP (last 3 results) No results for input(s): PROBNP in the last 8760 hours. HbA1C: No results for input(s): HGBA1C in the last 72 hours. CBG: Recent Labs  Lab 08/10/20 0911  GLUCAP 143*   Lipid Profile: No results for input(s): CHOL, HDL, LDLCALC, TRIG, CHOLHDL, LDLDIRECT in the last 72 hours. Thyroid Function Tests: No results for input(s): TSH, T4TOTAL, FREET4, T3FREE, THYROIDAB in the last 72 hours. Anemia Panel: No results for input(s): VITAMINB12, FOLATE, FERRITIN, TIBC, IRON, RETICCTPCT in the last 72 hours. Urine analysis:    Component Value Date/Time   COLORURINE YELLOW 08/10/2020 1150   APPEARANCEUR HAZY (A) 08/10/2020 1150   LABSPEC 1.025 08/10/2020 1150   PHURINE 5.0 08/10/2020 1150   GLUCOSEU NEGATIVE 08/10/2020 1150   HGBUR NEGATIVE 08/10/2020 1150   BILIRUBINUR NEGATIVE 08/10/2020 1150   KETONESUR 5 (A) 08/10/2020 1150   PROTEINUR 100 (A) 08/10/2020 1150   UROBILINOGEN 0.2 06/23/2013 1622   NITRITE NEGATIVE 08/10/2020 1150   LEUKOCYTESUR NEGATIVE 08/10/2020 1150   Sepsis Labs: @LABRCNTIP (procalcitonin:4,lacticidven:4) ) Recent Results (from the past 240 hour(s))  Culture, blood (routine x 2)     Status: None (Preliminary result)   Collection Time: 08/10/20  9:40 AM   Specimen: BLOOD    Result Value Ref Range Status   Specimen Description BLOOD LEFT ANTECUBITAL  Final   Special Requests   Final    BOTTLES DRAWN AEROBIC AND ANAEROBIC Blood Culture adequate volume   Culture   Final    NO GROWTH 2 DAYS Performed at [facility], [place] [address], [place], [place] [address]    Report Status PENDING  Incomplete  Culture, blood (routine x 2)     Status: None (Preliminary result)   Collection Time: 08/10/20  9:45 AM   Specimen: BLOOD  Result Value Ref Range Status   Specimen Description BLOOD LEFT ANTECUBITAL  Final   Special Requests   Final    BOTTLES DRAWN AEROBIC AND ANAEROBIC Blood Culture adequate volume   Culture   Final    NO GROWTH 2 DAYS Performed at [facility], [place] [address], [place], [place] [address]    Report Status PENDING  Incomplete  Respiratory Panel by RT PCR (Flu A&B, Covid) - Nasopharyngeal Swab     Status: None   Collection Time: 08/10/20 10:24 AM   Specimen: Nasopharyngeal Swab  Result Value Ref Range Status   SARS Coronavirus 2 by RT PCR NEGATIVE NEGATIVE Final    Comment: (NOTE) SARS-CoV-2 target nucleic acids are NOT DETECTED.  The SARS-CoV-2 RNA is generally detectable in upper respiratoy specimens during the acute phase of infection. The lowest concentration of SARS-CoV-2 viral copies this assay can detect is 131 copies/mL. A negative result does not preclude SARS-Cov-2 infection and should not be used as the sole basis for treatment or other patient management decisions. A negative result may occur with  improper specimen collection/handling, submission of specimen other than nasopharyngeal swab, presence of viral mutation(s) within the areas targeted by this assay, and inadequate number  of viral copies (<131 copies/mL). A negative result must be combined with clinical observations, patient history, and epidemiological information. The expected result is Negative.  Fact Sheet for Patients:   PinkCheek.be  Fact Sheet for Healthcare Providers:  GravelBags.it  This test is no t yet approved or cleared by the [place] FDA and  has been authorized for detection and/or diagnosis of SARS-CoV-2 by FDA under an Emergency Use Authorization (EUA). This EUA will remain  in effect (meaning this test can be used) for the duration of the COVID-19 declaration under Section 564(b)(1) of the Act, 21 U.S.C. section 360bbb-3(b)(1), unless the authorization is terminated or revoked sooner.     Influenza A by PCR NEGATIVE NEGATIVE Final   Influenza B by PCR NEGATIVE NEGATIVE Final    Comment: (NOTE) The Xpert Xpress SARS-CoV-2/FLU/RSV assay is intended as an aid in  the diagnosis of influenza from Nasopharyngeal swab specimens and  should not be used as a sole basis for treatment. Nasal washings and  aspirates are unacceptable for Xpert Xpress SARS-CoV-2/FLU/RSV  testing.  Fact Sheet for Patients: PinkCheek.be  Fact Sheet for Healthcare Providers: GravelBags.it  This test is not yet approved or cleared by the [place] FDA and  has been authorized for detection and/or diagnosis of SARS-CoV-2 by  FDA under an Emergency Use Authorization (EUA). This EUA will remain  in effect (meaning this test can be used) for the duration of the  Covid-19 declaration under Section 564(b)(1) of the Act, 21  U.S.C. section 360bbb-3(b)(1), unless the authorization is  terminated or revoked. Performed at [facility], [place] [address] [place], [place] [address]          Radiology Studies: No results found.    Scheduled Meds: . aspirin EC  81 mg Oral Daily  . atorvastatin  80 mg Oral Daily  . dronabinol  2.5 mg Oral BID AC  . enoxaparin (LOVENOX) injection  30 mg Subcutaneous Q24H  . feeding supplement (ENSURE ENLIVE)  237 mL Oral TID BM  . lisinopril  20 mg Oral  q morning - 10a  . megestrol  400 mg Oral BID  . multivitamin with minerals  1 tablet Oral Daily  . pantoprazole  40 mg Oral Daily   Continuous Infusions: . ceFEPime (MAXIPIME) IV 2 g (08/12/20 1403)  . metronidazole 500 mg (08/12/20 1246)  . vancomycin 500 mg (08/12/20 1505)     LOS: 2 days      Debbe Odea, MD [facility] Pager: [email or web address] 08/12/2020, 4:03 PM

## 2020-08-12 NOTE — Progress Notes (Signed)
Initial Nutrition Assessment  DOCUMENTATION CODES:   Severe malnutrition in context of chronic illness, Underweight  INTERVENTION:    Continue to offer Ensure Enlive po TID, each supplement provides 350 kcal and 20 grams of protein.  Continue MVI with minerals daily.  NUTRITION DIAGNOSIS:   Severe Malnutrition related to chronic illness (suspected lung cancer) as evidenced by severe muscle depletion, severe fat depletion.  Ongoing  GOAL:   Patient will meet greater than or equal to 90% of their needs   Unmet  MONITOR:   PO intake, Supplement acceptance  REASON FOR ASSESSMENT:   Consult Assessment of nutrition requirement/status  ASSESSMENT:   58 yo female admitted with sepsis, PNA, hypothermia. PMH includes recent multifocal CVA, HTN, hyperhomocysteinemia, HLD, GERD.   Patient remains confused this morning. Received consult for calorie count.  RN reports that patient has been eating nothing. The only thing she had yesterday was an [employer]. She refused her Ensure this morning.  Intake is inadequate. Calorie count not needed.  Labs reviewed.  Medications reviewed and include Marinol, Megace, MVI with minerals. IVF: 1/2 NS with KCl 20 mEq/L at 100 ml/h  Weight up to 39.1 kg this morning, likely r/t volume status.  Palliative care team has been consulted to discuss goals of care with family.   NUTRITION - FOCUSED PHYSICAL EXAM:  unable to complete  Diet Order:   Diet Order            DIET - DYS 1 Room service appropriate? No; Fluid consistency: Thin  Diet effective now                 EDUCATION NEEDS:   Not appropriate for education at this time  Skin:  Skin Assessment: Reviewed RN Assessment  Last BM:  9/30 type 6  Height:   Ht Readings from Last 1 Encounters:  08/05/20 5\' 1"  (1.549 m)    Weight:   Wt Readings from Last 1 Encounters:  08/12/20 39.1 kg    Ideal Body Weight:  47.7 kg  BMI:  Body mass index is 16.29  kg/m.  Estimated Nutritional Needs:   Kcal:  1300-1500  Protein:  60-70 gm  Fluid:  >/= 1.5 L    Lucas Mallow, RD, LDN, CNSC Please refer to Amion for contact information.

## 2020-08-13 DIAGNOSIS — G9341 Metabolic encephalopathy: Secondary | ICD-10-CM

## 2020-08-13 LAB — CBC
HCT: 37.1 % (ref 36.0–46.0)
Hemoglobin: 11.1 g/dL — ABNORMAL LOW (ref 12.0–15.0)
MCH: 24.8 pg — ABNORMAL LOW (ref 26.0–34.0)
MCHC: 29.9 g/dL — ABNORMAL LOW (ref 30.0–36.0)
MCV: 82.8 fL (ref 80.0–100.0)
Platelets: 329 10*3/uL (ref 150–400)
RBC: 4.48 MIL/uL (ref 3.87–5.11)
RDW: 19.2 % — ABNORMAL HIGH (ref 11.5–15.5)
WBC: 14.2 10*3/uL — ABNORMAL HIGH (ref 4.0–10.5)
nRBC: 0 % (ref 0.0–0.2)

## 2020-08-13 LAB — COMPREHENSIVE METABOLIC PANEL
ALT: 14 U/L (ref 0–44)
AST: 23 U/L (ref 15–41)
Albumin: 1.5 g/dL — ABNORMAL LOW (ref 3.5–5.0)
Alkaline Phosphatase: 75 U/L (ref 38–126)
Anion gap: 9 (ref 5–15)
BUN: 17 mg/dL (ref 8–23)
CO2: 21 mmol/L — ABNORMAL LOW (ref 22–32)
Calcium: 8.9 mg/dL (ref 8.9–10.3)
Chloride: 115 mmol/L — ABNORMAL HIGH (ref 98–111)
Creatinine, Ser: 0.63 mg/dL (ref 0.44–1.00)
GFR calc Af Amer: >60 mL/min (ref >60)
GFR calc non Af Amer: >60 mL/min (ref >60)
Glucose, Bld: 128 mg/dL — ABNORMAL HIGH (ref 70–99)
Potassium: 3.6 mmol/L (ref 3.5–5.1)
Sodium: 145 mmol/L (ref 135–145)
Total Bilirubin: 0.4 mg/dL (ref 0.3–1.2)
Total Protein: 6.7 g/dL (ref 6.5–8.1)

## 2020-08-13 MED ORDER — ATROPINE SULFATE 1 % OP SOLN
2.0000 [drp] | Freq: Four times a day (QID) | OPHTHALMIC | Status: DC | PRN
  Filled 2020-08-13: qty 2

## 2020-08-13 MED ORDER — LORAZEPAM 2 MG/ML PO CONC: 0.5000 mg | Freq: Four times a day (QID) | ORAL | Status: DC | PRN

## 2020-08-13 MED ORDER — MORPHINE SULFATE (CONCENTRATE) 10 MG/0.5ML PO SOLN: 5.0000 mg | ORAL | Status: DC | PRN

## 2020-08-13 NOTE — Progress Notes (Signed)
Pharmacy Antibiotic Note  [name] is a 59 y.o. female admitted on 08/09/2020 with sepsis.  Pharmacy has been consulted for vancomycin and cefepime dosing.   Cultures negative, Cr remains stable. Vancomycin and metronidazole stopped today.  Plan: Continue cefepime 2g IV q12h for now Can likely stop after 5 days  Weight: 39.1 kg (86 lb 3.2 oz)  Temp (24hrs), Avg:98 F (36.7 C), Min:97.9 F (36.6 C), Max:98.4 F (36.9 C)  Recent Labs  Lab 08/10/20 0906 08/10/20 1600 08/11/20 1409 08/11/20 2039 08/12/20 1124 08/12/20 2036  WBC 16.1*  --  13.6*  --  12.0*  --   CREATININE 0.80 0.84 0.65  --   --  0.68  LATICACIDVEN 1.4  --  2.2* 2.9*  --   --     Estimated Creatinine Clearance: 45.6 mL/min (by C-G formula based on SCr of 0.68 mg/dL).    No Known Allergies  Antimicrobials this admission: Vanc 9/28>>9/30 Cefepime 9/28>> Flagyl 9/28 >> 9/30   Microbiology results: 9/28 BCx: NGTD  Thank you for allowing pharmacy to be a part of this patients care.  Arrie Senate, PharmD, BCPS Clinical Pharmacist [phone] Please check AMION for all [facility] Pharmacy numbers 08/13/2020

## 2020-08-13 NOTE — Progress Notes (Signed)
PROGRESS NOTE    [name]   OZD:[id number]  DOB: 23-Mar-1961  DOA: 08/09/2020 PCP: Tanya Pepper, MD   Brief Narrative:  [name] is a 59 y.o. 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   She was discharged on 07/26/20 after being treated for a left frontal lobe infarct and few frontal parietal and occipital watershed infarcts which presented as confusion . She as also noted to have fever of 102.9 and was found to have a left basilar pneumonia on CXR. She was sent home with her husband.  Since then she has stopped eating and has become severely weak and more confused.  In the ED she has underwent a CT of the chest and is found to have a left upper lobe cavitary lung lesion.  CT chest:  Large cavitary lesion with irregular walls in the left upper lobe abutting the left hilum with associated hilar and mediastinal necrotic adenopathy. 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 Findings of erosive change into the left bronchial tree are noted as well as ingrowth into the left pulmonary vein likely related to tumor thrombus.  Some associated left lower lobe infiltrate is noted likely related to central bronchial occlusion. Small left pleural effusion is noted as well.  Subjective: Remains confused and is not touching her food tray. Mother is at bedside.     Assessment & Plan:   Principal Problem:   Sepsis likely due to pneumonia  - this is likely a post obstructive pneumonia- cont Cefepime, Vanc and Flagyl- if blood cultures are negative tomorrow, will narrow antibiotics - 10/1> narrowed down to Cefepime only  Active Problems:  Lung mass - due to inability to eat, severe weigth loss and poor activity, pulmonary recommends palliative care rather than a biopsy and treatment -  palliative care team has become involved- she is now a DNR- plan for either hospice home or home with hospice - I have had a discussion with her mother today and she is in agreement with the plan- her husband has opted for a hospice home- we will wait on an opening    Acute metabolic encephalopathy - CT head is negative for brain metastasis- MRI not performed but she may have more CVAs or metastasis- as mentioned above, the plan is for comfort care eventually - possibly also secondary to sepsis    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)     Anorexia &  Severe protein-calorie malnutrition Altamease Oiler: less than 60% of standard weight) - due to cancer and probable infection    Hypokalemia  K was 2.5 an admission - has been replaced  Hypernatremia due to dehydration - sodium 152- has improved to 145 with IVF    Time spent in minutes: 35  DVT prophylaxis:   Code Status: DNR Family Communication:  Disposition Plan:  Status is: Inpatient  Remains inpatient appropriate because:treating infection- waiting on hospice home bed   Dispo: The patient is from: Home              Anticipated d/c is to: tbd-  hospice home               Anticipated d/c date is: 2 days              Patient currently is not medically stable to d/c.  Consultants:   Pulmonary   Palliative care Procedures:    Antimicrobials:  Anti-infectives (From admission, onward)   Start     Dose/Rate Route Frequency Ordered Stop   08/11/20 1300  vancomycin (VANCOREADY) IVPB 500 mg/100 mL  Status:  Discontinued        500 mg 100 mL/hr over 60 Minutes Intravenous Every 24 hours 08/10/20 1158 08/13/20 0803   08/10/20 2300  ceFEPIme (MAXIPIME) 2 g in sodium chloride 0.9 % 100 mL IVPB  Status:  Discontinued        2 g 200 mL/hr over 30 Minutes Intravenous Every 12 hours 08/10/20 1158 08/13/20 1110   08/10/20 1900  metroNIDAZOLE (FLAGYL) IVPB 500 mg  Status:  Discontinued        500 mg 100 mL/hr over 60  Minutes Intravenous Every 8 hours 08/10/20 1348 08/13/20 0803   08/10/20 1000  vancomycin (VANCOREADY) IVPB 750 mg/150 mL        750 mg 150 mL/hr over 60 Minutes Intravenous  Once 08/10/20 0946 08/10/20 1411   08/10/20 0945  ceFEPIme (MAXIPIME) 2 g in sodium chloride 0.9 % 100 mL IVPB        2 g 200 mL/hr over 30 Minutes Intravenous  Once 08/10/20 0939 08/10/20 1113   08/10/20 0945  metroNIDAZOLE (FLAGYL) IVPB 500 mg        500 mg 100 mL/hr over 60 Minutes Intravenous  Once 08/10/20 0939 08/10/20 1236   08/10/20 0945  vancomycin (VANCOCIN) IVPB 1000 mg/200 mL premix  Status:  Discontinued        1,000 mg 200 mL/hr over 60 Minutes Intravenous  Once 08/10/20 0939 08/10/20 0946       Objective: Vitals:   08/13/20 0100 08/13/20 0500 08/13/20 0729 08/13/20 1059  BP: (!) 135/99  (!) 160/80 123/77  Pulse: 93  99 (!) 134  Resp: 18 20 18 18   Temp: 98 F (36.7 C) 97.9 F (36.6 C) 98 F (36.7 C) (!) 97.3 F (36.3 C)  TempSrc: Axillary Axillary Oral Oral  SpO2: 100% 99% 98% 97%  Weight:        Intake/Output Summary (Last 24 hours) at 08/13/2020 1727 Last data filed at 08/13/2020 1300 Gross per 24 hour  Intake 360 ml  Output --  Net 360 ml   Filed Weights   08/11/20 0046 08/11/20 0443 08/12/20 0417  Weight: 37 kg 36.6 kg 39.1 kg    Examination: General exam: Appears comfortable - confused HEENT: PERRLA, oral mucosa moist, no sclera icterus or thrush Respiratory system: crackles in left lung field -Respiratory effort normal. Cardiovascular system: S1 & S2 heard,  No murmurs - tachycardic Gastrointestinal system: Abdomen soft, non-tender, nondistended. Normal bowel sounds Central nervous system: Alert and oriented only to person only. No focal neurological deficits. Extremities: No cyanosis, clubbing or edema Skin: No rashes or ulcers Psychiatry:  Mood & affect appropriate.       Data Reviewed: I have personally reviewed following labs and imaging studies  CBC: Recent  Labs  Lab 08/10/20 0906 08/11/20 1409 08/12/20 1124 08/13/20 0824  WBC 16.1* 13.6* 12.0* 14.2*  NEUTROABS 12.3*  --   --   --   HGB 11.9* 12.1 13.2 11.1*  HCT 40.0 41.1 45.0 37.1  MCV 83.7 86.0 85.4 82.8  PLT 470* 369 310 631   Basic Metabolic Panel: Recent Labs  Lab 08/10/20 0906 08/10/20 1600 08/10/20 1700 08/11/20 1409 08/12/20 2036 08/13/20 0824  NA 152* 150*  --  145 144 145  K 2.5* 4.2  --  4.3 4.2 3.6  CL 104 110  --  108 115* 115*  CO2 32 26  --  24 20* 21*  GLUCOSE 143* 156*  --  149* 173* 128*  BUN 39* 37*  --  27* 19 17  CREATININE 0.80 0.84  --  0.65 0.68 0.63  CALCIUM 10.3 8.9  --  9.2 8.8* 8.9  MG 2.4  --  2.4  --   --   --   PHOS  --   --  2.9  --   --   --    GFR: Estimated Creatinine Clearance: 45.6 mL/min (by C-G formula based on SCr of 0.63 mg/dL). Liver Function Tests: Recent Labs  Lab 08/10/20 0906 08/12/20 2036 08/13/20 0824  AST 30 23 23   ALT 17 15 14   ALKPHOS 89 77 75  BILITOT 0.8 0.4 0.4  PROT 7.9 6.7 6.7  ALBUMIN 1.9* 1.5* 1.5*   No results for input(s): LIPASE, AMYLASE in the last 168 hours. No results for input(s): AMMONIA in the last 168 hours. Coagulation Profile: No results for input(s): INR, PROTIME in the last 168 hours. Cardiac Enzymes: No results for input(s): CKTOTAL, CKMB, CKMBINDEX, TROPONINI in the last 168 hours. BNP (last 3 results) No results for input(s): PROBNP in the last 8760 hours. HbA1C: No results for input(s): HGBA1C in the last 72 hours. CBG: Recent Labs  Lab 08/10/20 0911  GLUCAP 143*   Lipid Profile: No results for input(s): CHOL, HDL, LDLCALC, TRIG, CHOLHDL, LDLDIRECT in the last 72 hours. Thyroid Function Tests: No results for input(s): TSH, T4TOTAL, FREET4, T3FREE, THYROIDAB in the last 72 hours. Anemia Panel: No results for input(s): VITAMINB12, FOLATE, FERRITIN, TIBC, IRON, RETICCTPCT in the last 72 hours. Urine analysis:    Component Value Date/Time   COLORURINE YELLOW 08/10/2020 1150    APPEARANCEUR HAZY (A) 08/10/2020 1150   LABSPEC 1.025 08/10/2020 1150   PHURINE 5.0 08/10/2020 1150   GLUCOSEU NEGATIVE 08/10/2020 1150   HGBUR NEGATIVE 08/10/2020 1150   BILIRUBINUR NEGATIVE 08/10/2020 1150   KETONESUR 5 (A) 08/10/2020 1150   PROTEINUR 100 (A) 08/10/2020 1150   UROBILINOGEN 0.2 06/23/2013 1622   NITRITE NEGATIVE 08/10/2020 1150   LEUKOCYTESUR NEGATIVE 08/10/2020 1150   Sepsis Labs: @LABRCNTIP (procalcitonin:4,lacticidven:4) ) Recent Results (from the past 240 hour(s))  Culture, blood (routine x 2)     Status: None (Preliminary result)   Collection Time: 08/10/20  9:40 AM   Specimen: BLOOD  Result Value Ref Range Status   Specimen Description BLOOD LEFT ANTECUBITAL  Final   Special Requests   Final    BOTTLES DRAWN AEROBIC AND ANAEROBIC Blood Culture adequate volume   Culture   Final    NO GROWTH 3 DAYS Performed at [facility], [place] [address], [place], [place] [address]    Report Status PENDING  Incomplete  Culture, blood (routine x 2)     Status: None (Preliminary result)   Collection Time: 08/10/20  9:45 AM   Specimen: BLOOD  Result Value Ref Range Status   Specimen Description BLOOD LEFT ANTECUBITAL  Final   Special Requests   Final    BOTTLES DRAWN AEROBIC AND ANAEROBIC Blood Culture adequate volume   Culture   Final    NO GROWTH 3 DAYS Performed at [facility], [place] [address], [place], [place] [address]    Report Status PENDING  Incomplete  Respiratory Panel by RT PCR (Flu A&B, Covid) - Nasopharyngeal Swab  Status: None   Collection Time: 08/10/20 10:24 AM   Specimen: Nasopharyngeal Swab  Result Value Ref Range Status   SARS Coronavirus 2 by RT PCR NEGATIVE NEGATIVE Final    Comment: (NOTE) SARS-CoV-2 target nucleic acids are NOT DETECTED.  The SARS-CoV-2 RNA is generally detectable in upper respiratoy specimens during the acute phase of infection. The lowest concentration of SARS-CoV-2 viral copies this assay can detect  is 131 copies/mL. A negative result does not preclude SARS-Cov-2 infection and should not be used as the sole basis for treatment or other patient management decisions. A negative result may occur with  improper specimen collection/handling, submission of specimen other than nasopharyngeal swab, presence of viral mutation(s) within the areas targeted by this assay, and inadequate number of viral copies (<131 copies/mL). A negative result must be combined with clinical observations, patient history, and epidemiological information. The expected result is Negative.  Fact Sheet for Patients:  PinkCheek.be  Fact Sheet for Healthcare Providers:  GravelBags.it  This test is no t yet approved or cleared by the [place] FDA and  has been authorized for detection and/or diagnosis of SARS-CoV-2 by FDA under an Emergency Use Authorization (EUA). This EUA will remain  in effect (meaning this test can be used) for the duration of the COVID-19 declaration under Section 564(b)(1) of the Act, 21 U.S.C. section 360bbb-3(b)(1), unless the authorization is terminated or revoked sooner.     Influenza A by PCR NEGATIVE NEGATIVE Final   Influenza B by PCR NEGATIVE NEGATIVE Final    Comment: (NOTE) The Xpert Xpress SARS-CoV-2/FLU/RSV assay is intended as an aid in  the diagnosis of influenza from Nasopharyngeal swab specimens and  should not be used as a sole basis for treatment. Nasal washings and  aspirates are unacceptable for Xpert Xpress SARS-CoV-2/FLU/RSV  testing.  Fact Sheet for Patients: PinkCheek.be  Fact Sheet for Healthcare Providers: GravelBags.it  This test is not yet approved or cleared by the [place] FDA and  has been authorized for detection and/or diagnosis of SARS-CoV-2 by  FDA under an Emergency Use Authorization (EUA). This EUA will remain  in effect  (meaning this test can be used) for the duration of the  Covid-19 declaration under Section 564(b)(1) of the Act, 21  U.S.C. section 360bbb-3(b)(1), unless the authorization is  terminated or revoked. Performed at [facility], [place] [address], [place], [place] [address]          Radiology Studies: No results found.    Scheduled Meds: . dronabinol  2.5 mg Oral BID AC  . feeding supplement (ENSURE ENLIVE)  237 mL Oral TID BM  . lisinopril  20 mg Oral q morning - 10a  . megestrol  400 mg Oral BID   Continuous Infusions:    LOS: 3 days      Debbe Odea, MD [facility] Pager: [email or web address] 08/13/2020, 5:27 PM

## 2020-08-13 NOTE — Progress Notes (Addendum)
CSW received consult from palliative for residential hospice for patient. CSW made referral to [facility] and spoke with Chrislyn. CSW let Chrislyn know of patients spouse request for [facility]. Chrislyn will follow up with patients spouse. Chrislyn let CSW know there is no bed availability today. Chrislyn will follow up with [facility] team with bed availability.  CSW will continue to follow.

## 2020-08-13 NOTE — Progress Notes (Signed)
Daily Progress Note   Patient Name: [name]       Date: 08/13/2020 DOB: [birth year]  Age: 59 y.o. MRN#: [id number] Attending Physician: Debbe Odea, MD Primary Care Physician: London Pepper, MD Admit Date: 08/09/2020  Reason for Consultation/Follow-up: Establishing goals of care  Subjective: Patient awake but disoriented. Does not interact in conversation with aphasia. Declined breakfast for RN but did accept AM medications and half of an Ensure.   GOC:  F/u with patient's husband, [name] at bedside.   Discussed course of hospitalization including diagnoses, interventions, plan of care.   MOST form was completed yesterday. DNR/DNI, NO feeding tube. Understanding poor prognosis, [name] wishes to focus on [name]'s comfort. Besides Ensure, her appetite remains very poor. IVF have been stopped. [name] wishes to try and have her placed into inpatient hospice facility, understanding hospice philosophy and focus on comfort/symptom management. Discussed shift to comfort measures in the hospital and medications as needed for symptoms. Discussed comfort feeds. Explained that interventions not aimed at comfort will be discontinued. [name] understands and agrees with this plan. [name] spoke with the patient's mother this AM who also agrees with plan for comfort.   Answered questions. [name] has PMT contact information. Explained TOC referral process.   Updated RN and Dr. Wynelle Cleveland.     Length of Stay: 3  Current Medications: Scheduled Meds:  . dronabinol  2.5 mg Oral BID AC  . feeding supplement (ENSURE ENLIVE)  237 mL Oral TID BM  . lisinopril  20 mg Oral q morning - 10a  . megestrol  400 mg Oral BID    Continuous Infusions:   PRN Meds: acetaminophen **OR** acetaminophen,  atropine, LORazepam, morphine CONCENTRATE, ondansetron **OR** ondansetron (ZOFRAN) IV  Physical Exam Vitals and nursing note reviewed.  Constitutional:      Appearance: She is cachectic. She is ill-appearing.  HENT:     Head: Normocephalic and atraumatic.  Cardiovascular:     Rate and Rhythm: Tachycardia present.  Pulmonary:     Effort: No tachypnea, accessory muscle usage or respiratory distress.  Abdominal:     Tenderness: There is no abdominal tenderness.  Neurological:     Mental Status: She is easily aroused.     Comments: Awake, aphasic, confused  Psychiatric:        Attention and Perception: She is inattentive.  Speech: Speech is delayed.        Cognition and Memory: Cognition is impaired.            Vital Signs: BP 123/77 (BP Location: Left Arm)   Pulse (!) 134   Temp (!) 97.3 F (36.3 C) (Oral)   Resp 18   Wt 39.1 kg   SpO2 97%   BMI 16.29 kg/m  SpO2: SpO2: 97 % O2 Device: O2 Device: Room Air O2 Flow Rate:    Intake/output summary:   Intake/Output Summary (Last 24 hours) at 08/13/2020 1431 Last data filed at 08/12/2020 1502 Gross per 24 hour  Intake 2280 ml  Output --  Net 2280 ml   LBM: Last BM Date: 08/12/20 Baseline Weight: Weight: 37 kg Most recent weight: Weight: 39.1 kg       Palliative Assessment/Data: PPS 20%    Flowsheet Rows     Most Recent Value  Intake Tab  Referral Department Hospitalist  Unit at Time of Referral Med/Surg Unit  Palliative Care Primary Diagnosis Cancer  Palliative Care Type New Palliative care  Reason for referral Clarify Goals of Care  Date first seen by Palliative Care 08/12/20  Clinical Assessment  Palliative Performance Scale Score 20%  Psychosocial & Spiritual Assessment  Palliative Care Outcomes  Patient/Family meeting held? Yes  Who was at the meeting? husband [name])  [place] goals of care, Counseled regarding hospice, Provided end of life care assistance, Improved pain  interventions, Improved non-pain symptom therapy, Provided advance care planning, Provided psychosocial or spiritual support, Completed durable DNR, ACP counseling assistance, Transitioned to hospice      Patient Active Problem List   Diagnosis Date Noted  . Palliative care by specialist   . Goals of care, counseling/discussion   . Lung mass 08/11/2020  . Hypokalemia   . Sepsis due to pneumonia ([place]) 08/06/2020  . Acute metabolic encephalopathy 01/75/1025  . Iron deficiency anemia due to chronic blood loss 07/24/2020  . Severe protein-calorie malnutrition Altamease Oiler: less than 60% of standard weight) ([place]) 07/24/2020  . Acute CVA (cerebrovascular accident) ([place]) 07/23/2020  . DENTAL CARIES 01/27/2008  . GERD 11/27/2006  . 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 ABDOMINAL MASS 11/27/2006  . Disorder of sulfur-bearing amino acid metabolism ([place]) 11/23/2006  . Essential hypertension 11/23/2006  . LOW BACK PAIN 11/23/2006  . CEREBROVASCULAR ACCIDENT, HX OF 11/23/2006    Palliative Care Assessment & Plan   Patient Profile: 59 y.o. female  with past medical history of recent hospitalization following CVA and pneumonia, HTN, hyperhomocystinemia, HLD, GERD admitted on 08/09/2020 with weakness, poor oral intake, weight loss. 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 CT head with extensive right encephalomalacia, atrophy/microvascular changes. Pulmonology consulted. Most likely lung cancer based on imaging but with adult failure tor thrive and hx of CVA's now aphasic, patient would not benefit from bronchoscopy with incurable condition in her current state. Pulmonology recommending palliative consultation for goals of care.   Assessment: Sepsis likely due to pneumonia Lung mass likely malignancy Acute metabolic encephalopathy  Adult failure to thrive Severe protein calorie malnutrition Hx of CVA with  aphasia Hypernatremia Dehydration  Recommendations/Plan:  F/u GOC with husband/POA at bedside.  Transition to comfort focused care plan.   TOC team consulted for residential hospice placement. Husband prefers [employer].   Comfort feeds per patient/family request. Patient needs assist and encouragement with meals. Aspiration precautions.   Comfort meds on MAR.  MOST form completed  9/30. Decisions include: DNR/DNI, comfort focused pathway, IVF/ABX for time trial, and NO feeding tube. Durable DNR completed.   Goals of Care and Additional Recommendations:  Limitations on Scope of Treatment: Full Comfort Care  Code Status: DNR/DNI   Code Status Orders  (From admission, onward)         Start     Ordered   08/10/20 1539  Do not attempt resuscitation (DNR)  Continuous       Question Answer Comment  In the event of cardiac or respiratory ARREST Do not call a "code blue"   In the event of cardiac or respiratory ARREST Do not perform Intubation, CPR, defibrillation or ACLS   In the event of cardiac or respiratory ARREST Use medication by any route, position, wound care, and other measures to relive pain and suffering. May use oxygen, suction and manual treatment of airway obstruction as needed for comfort.      08/10/20 1538        Code Status History    Date Active Date Inactive Code Status Order ID Comments User Context   08/10/2020 1346 08/10/2020 1538 Full Code [id number]  Lequita Halt, MD ED   07/23/2020 2056 07/26/2020 2329 Full Code [id number]  Kayleen Memos, DO ED   Advance Care Planning Activity       Prognosis:   Poor prognosis: likely weeks now that life-prolonging interventions have been discontinued. Patient with essentially no oral intake.   Discharge Planning:  Hospice facility  Care plan was discussed with RN, Dr. Wynelle Cleveland, husband  Thank you for allowing the Palliative Medicine Team to assist in the care of this patient.   Total Time 40 Prolonged Time  Billed no      Greater than 50%  of this time was spent counseling and coordinating care related to the above assessment and plan.  Ihor Dow, DNP, FNP-C Palliative Medicine Team  Phone: [phone] Fax: [phone]  Please contact Palliative Medicine Team phone at [phone] for questions and concerns.

## 2020-08-13 NOTE — Progress Notes (Signed)
   08/13/20 1059  Assess: MEWS Score  Temp (!) 97.3 F (36.3 C)  BP 123/77  Pulse Rate (!) 134  ECG Heart Rate (!) 130  Resp 18  Level of Consciousness Alert  SpO2 97 %  O2 Device Room Air  Patient Activity (if Appropriate) In bed  Assess: if the MEWS score is Yellow or Red  Were vital signs taken at a resting state? Yes  Focused Assessment No change from prior assessment  Early Detection of Sepsis Score *See Row Information* Medium  MEWS guidelines implemented *See Row Information* Yes  Treat  MEWS Interventions Escalated (See documentation below)  Take Vital Signs  Increase Vital Sign Frequency  Yellow: Q 2hr X 2 then Q 4hr X 2, if remains yellow, continue Q 4hrs  Escalate  MEWS: Escalate Yellow: discuss with charge nurse/RN and consider discussing with provider and RRT  Notify: Charge Nurse/RN  Name of Charge Nurse/RN Notified Kristen RN   Date Charge Nurse/RN Notified 08/13/20  Time Charge Nurse/RN Notified 33  Notify: Provider  Provider Name/Title Rizwan MD  Date Provider Notified 08/13/20  Time Provider Notified 1106  Notification Type Page  Notification Reason  (HR )  Document  Patient Outcome Other (Comment)  Progress note created (see row info) Yes  Yellow MEWS protocol implemented

## 2020-08-13 NOTE — Progress Notes (Signed)
Manufacturing engineer [facility]) Hospital Liaison note.    Received request from [place] for family interest in [facility]. Unfortunately, [facility] is unable to offer a room today. Hospital Liaison will follow up tomorrow or sooner if a room becomes available.    Please do not hesitate to call with questions. Thank you for the opportunity to participate in this patient's care.  Chrislyn Edison Pace, BSN, RN [place] (listed on [place] under [facility])    [phone]

## 2020-08-14 NOTE — Progress Notes (Signed)
PROGRESS NOTE    [name]   ZOX:[id number]  DOB: February 24, 1961  DOA: 08/09/2020 PCP: London Pepper, MD   Brief Narrative:  [name] is a 59 y.o. 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   She was discharged on 07/26/20 after being treated for a left frontal lobe infarct and few frontal parietal and occipital watershed infarcts which presented as confusion . She as also noted to have fever of 102.9 and was found to have a left basilar pneumonia on CXR. She was sent home with her husband.  Since then she has stopped eating and has become severely weak and more confused.  In the ED she has underwent a CT of the chest and is found to have a left upper lobe cavitary lung lesion.  CT chest:  Large cavitary lesion with irregular walls in the left upper lobe abutting the left hilum with associated hilar and mediastinal necrotic adenopathy. 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 Findings of erosive change into the left bronchial tree are noted as well as ingrowth into the left pulmonary vein likely related to tumor thrombus.  Some associated left lower lobe infiltrate is noted likely related to central bronchial occlusion. Small left pleural effusion is noted as well.  Subjective: No new complaints.     Assessment & Plan:   Principal Problem:   Sepsis likely due to pneumonia  - this is likely a post obstructive pneumonia- cont Cefepime, Vanc and Flagyl- if blood cultures are negative tomorrow, will narrow antibiotics - 10/1> narrowed down to Cefepime only which has been stopped due to transition to comfort care  Active Problems:  Lung mass - due to inability to eat, severe weigth loss and poor activity, pulmonary recommends palliative care rather than a biopsy and treatment -  palliative care team has become involved- she is now a DNR- plan for either hospice home or home with hospice - I have had a discussion with her mother today and she is in agreement with the plan- her husband has opted for a hospice home- we will wait on an opening    Acute metabolic encephalopathy - CT head is negative for brain metastasis- MRI not performed but she may have more CVAs or metastasis- as mentioned above, the plan is for comfort care eventually - possibly also secondary to sepsis    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)     Anorexia &  Severe protein-calorie malnutrition Altamease Oiler: less than 60% of standard weight) - due to cancer and probable infection    Hypokalemia  K was 2.5 an admission - has been replaced  Hypernatremia due to dehydration - sodium 152- has improved to 145 with IVF    Time spent in minutes: 35  DVT prophylaxis:   Code Status: DNR Family Communication:  Disposition Plan:  Status is: Inpatient  Remains inpatient appropriate because:- waiting on hospice home bed   Dispo: The patient is from: Home              Anticipated d/c is to: tbd-  hospice home               Anticipated d/c date is: 2 days              Patient currently is not medically stable to d/c.      Consultants:  Pulmonary   Palliative care Procedures:    Antimicrobials:  Anti-infectives (From admission, onward)   Start     Dose/Rate Route Frequency Ordered Stop   08/11/20 1300  vancomycin (VANCOREADY) IVPB 500 mg/100 mL  Status:  Discontinued        500 mg 100 mL/hr over 60 Minutes Intravenous Every 24 hours 08/10/20 1158 08/13/20 0803   08/10/20 2300  ceFEPIme (MAXIPIME) 2 g in sodium chloride 0.9 % 100 mL IVPB  Status:  Discontinued        2 g 200 mL/hr over 30 Minutes Intravenous Every 12 hours 08/10/20 1158 08/13/20 1110   08/10/20 1900  metroNIDAZOLE (FLAGYL) IVPB 500 mg  Status:  Discontinued        500 mg 100 mL/hr over 60 Minutes  Intravenous Every 8 hours 08/10/20 1348 08/13/20 0803   08/10/20 1000  vancomycin (VANCOREADY) IVPB 750 mg/150 mL        750 mg 150 mL/hr over 60 Minutes Intravenous  Once 08/10/20 0946 08/10/20 1411   08/10/20 0945  ceFEPIme (MAXIPIME) 2 g in sodium chloride 0.9 % 100 mL IVPB        2 g 200 mL/hr over 30 Minutes Intravenous  Once 08/10/20 0939 08/10/20 1113   08/10/20 0945  metroNIDAZOLE (FLAGYL) IVPB 500 mg        500 mg 100 mL/hr over 60 Minutes Intravenous  Once 08/10/20 0939 08/10/20 1236   08/10/20 0945  vancomycin (VANCOCIN) IVPB 1000 mg/200 mL premix  Status:  Discontinued        1,000 mg 200 mL/hr over 60 Minutes Intravenous  Once 08/10/20 0939 08/10/20 0946       Objective: Vitals:   08/13/20 2200 08/14/20 0547 08/14/20 0604 08/14/20 1100  BP: (!) 141/81 (!) 155/77  (!) 146/89  Pulse: (!) 105 96 97 (!) 103  Resp: 18   19  Temp: 98.3 F (36.8 C) 98.3 F (36.8 C)  (!) 97.4 F (36.3 C)  TempSrc: Oral Oral  Oral  SpO2: 100% 98% 96% 100%  Weight:   39.4 kg    No intake or output data in the 24 hours ending 08/14/20 1731 Filed Weights   08/11/20 0443 08/12/20 0417 08/14/20 0604  Weight: 36.6 kg 39.1 kg 39.4 kg    Examination: Alert and oriented only to person, otherwise confused.     Data Reviewed: I have personally reviewed following labs and imaging studies  CBC: Recent Labs  Lab 08/10/20 0906 08/11/20 1409 08/12/20 1124 08/13/20 0824  WBC 16.1* 13.6* 12.0* 14.2*  NEUTROABS 12.3*  --   --   --   HGB 11.9* 12.1 13.2 11.1*  HCT 40.0 41.1 45.0 37.1  MCV 83.7 86.0 85.4 82.8  PLT 470* 369 310 542   Basic Metabolic Panel: Recent Labs  Lab 08/10/20 0906 08/10/20 1600 08/10/20 1700 08/11/20 1409 08/12/20 2036 08/13/20 0824  NA 152* 150*  --  145 144 145  K 2.5* 4.2  --  4.3 4.2 3.6  CL 104 110  --  108 115* 115*  CO2 32 26  --  24 20* 21*  GLUCOSE 143* 156*  --  149* 173* 128*  BUN 39* 37*  --  27* 19 17  CREATININE 0.80 0.84  --  0.65 0.68 0.63    CALCIUM 10.3 8.9  --  9.2 8.8* 8.9  MG 2.4  --  2.4  --   --   --   PHOS  --   --  2.9  --   --   --    GFR: Estimated Creatinine Clearance: 45.9 mL/min (by C-G formula based on SCr of 0.63 mg/dL). Liver Function Tests: Recent Labs  Lab 08/10/20 0906 08/12/20 2036 08/13/20 0824  AST 30 23 23   ALT 17 15 14   ALKPHOS 89 77 75  BILITOT 0.8 0.4 0.4  PROT 7.9 6.7 6.7  ALBUMIN 1.9* 1.5* 1.5*   No results for input(s): LIPASE, AMYLASE in the last 168 hours. No results for input(s): AMMONIA in the last 168 hours. Coagulation Profile: No results for input(s): INR, PROTIME in the last 168 hours. Cardiac Enzymes: No results for input(s): CKTOTAL, CKMB, CKMBINDEX, TROPONINI in the last 168 hours. BNP (last 3 results) No results for input(s): PROBNP in the last 8760 hours. HbA1C: No results for input(s): HGBA1C in the last 72 hours. CBG: Recent Labs  Lab 08/10/20 0911  GLUCAP 143*   Lipid Profile: No results for input(s): CHOL, HDL, LDLCALC, TRIG, CHOLHDL, LDLDIRECT in the last 72 hours. Thyroid Function Tests: No results for input(s): TSH, T4TOTAL, FREET4, T3FREE, THYROIDAB in the last 72 hours. Anemia Panel: No results for input(s): VITAMINB12, FOLATE, FERRITIN, TIBC, IRON, RETICCTPCT in the last 72 hours. Urine analysis:    Component Value Date/Time   COLORURINE YELLOW 08/10/2020 1150   APPEARANCEUR HAZY (A) 08/10/2020 1150   LABSPEC 1.025 08/10/2020 1150   PHURINE 5.0 08/10/2020 1150   GLUCOSEU NEGATIVE 08/10/2020 1150   HGBUR NEGATIVE 08/10/2020 1150   BILIRUBINUR NEGATIVE 08/10/2020 1150   KETONESUR 5 (A) 08/10/2020 1150   PROTEINUR 100 (A) 08/10/2020 1150   UROBILINOGEN 0.2 06/23/2013 1622   NITRITE NEGATIVE 08/10/2020 1150   LEUKOCYTESUR NEGATIVE 08/10/2020 1150   Sepsis Labs: @LABRCNTIP (procalcitonin:4,lacticidven:4) ) Recent Results (from the past 240 hour(s))  Culture, blood (routine x 2)     Status: None (Preliminary result)   Collection Time: 08/10/20   9:40 AM   Specimen: BLOOD  Result Value Ref Range Status   Specimen Description BLOOD LEFT ANTECUBITAL  Final   Special Requests   Final    BOTTLES DRAWN AEROBIC AND ANAEROBIC Blood Culture adequate volume   Culture   Final    NO GROWTH 4 DAYS Performed at [facility], [place] [address] [place], [place] [address]    Report Status PENDING  Incomplete  Culture, blood (routine x 2)     Status: None (Preliminary result)   Collection Time: 08/10/20  9:45 AM   Specimen: BLOOD  Result Value Ref Range Status   Specimen Description BLOOD LEFT ANTECUBITAL  Final   Special Requests   Final    BOTTLES DRAWN AEROBIC AND ANAEROBIC Blood Culture adequate volume   Culture   Final    NO GROWTH 4 DAYS Performed at [facility], [place] [address] [place], [place] [address]    Report Status PENDING  Incomplete  Respiratory Panel by RT PCR (Flu A&B, Covid) - Nasopharyngeal Swab     Status: None   Collection Time: 08/10/20 10:24 AM   Specimen: Nasopharyngeal Swab  Result Value Ref Range Status   SARS Coronavirus 2 by RT PCR NEGATIVE NEGATIVE Final    Comment: (NOTE) SARS-CoV-2 target nucleic acids are NOT DETECTED.  The SARS-CoV-2 RNA is generally detectable in upper respiratoy specimens during the acute phase of infection. The lowest concentration of SARS-CoV-2 viral copies this assay can detect is 131 copies/mL. A negative result does not preclude SARS-Cov-2 infection and should not be used as the sole  basis for treatment or other patient management decisions. A negative result may occur with  improper specimen collection/handling, submission of specimen other than nasopharyngeal swab, presence of viral mutation(s) within the areas targeted by this assay, and inadequate number of viral copies (<131 copies/mL). A negative result must be combined with clinical observations, patient history, and epidemiological information. The expected result is Negative.  Fact Sheet for Patients:    PinkCheek.be  Fact Sheet for Healthcare Providers:  GravelBags.it  This test is no t yet approved or cleared by the [place] FDA and  has been authorized for detection and/or diagnosis of SARS-CoV-2 by FDA under an Emergency Use Authorization (EUA). This EUA will remain  in effect (meaning this test can be used) for the duration of the COVID-19 declaration under Section 564(b)(1) of the Act, 21 U.S.C. section 360bbb-3(b)(1), unless the authorization is terminated or revoked sooner.     Influenza A by PCR NEGATIVE NEGATIVE Final   Influenza B by PCR NEGATIVE NEGATIVE Final    Comment: (NOTE) The Xpert Xpress SARS-CoV-2/FLU/RSV assay is intended as an aid in  the diagnosis of influenza from Nasopharyngeal swab specimens and  should not be used as a sole basis for treatment. Nasal washings and  aspirates are unacceptable for Xpert Xpress SARS-CoV-2/FLU/RSV  testing.  Fact Sheet for Patients: PinkCheek.be  Fact Sheet for Healthcare Providers: GravelBags.it  This test is not yet approved or cleared by the [place] FDA and  has been authorized for detection and/or diagnosis of SARS-CoV-2 by  FDA under an Emergency Use Authorization (EUA). This EUA will remain  in effect (meaning this test can be used) for the duration of the  Covid-19 declaration under Section 564(b)(1) of the Act, 21  U.S.C. section 360bbb-3(b)(1), unless the authorization is  terminated or revoked. Performed at [facility], [place] [address] [place], [place] [address]          Radiology Studies: No results found.    Scheduled Meds: . dronabinol  2.5 mg Oral BID AC  . feeding supplement (ENSURE ENLIVE)  237 mL Oral TID BM  . lisinopril  20 mg Oral q morning - 10a  . megestrol  400 mg Oral BID   Continuous Infusions:    LOS: 4 days      Debbe Odea, MD [facility] Pager: [email or web address] 08/14/2020, 5:31 PM

## 2020-08-14 NOTE — Progress Notes (Signed)
Manufacturing engineer [employer]) Hospital Liaison note.    Received request from [facility] for family interest in [facility]. Unfortunately, [facility] is unable to offer a room today. Hospital Liaison will follow up tomorrow or sooner if a room becomes available.    Please do not hesitate to call with questions. Thank you for the opportunity to participate in this patient's care.  Clementeen Hoof, BSN, RN [facility] [place] (listed on [place] [facility])    [phone]

## 2020-08-15 LAB — CULTURE, BLOOD (ROUTINE X 2)
Culture: NO GROWTH
Culture: NO GROWTH
Special Requests: ADEQUATE
Special Requests: ADEQUATE

## 2020-08-15 NOTE — Plan of Care (Signed)
  Problem: Clinical Measurements: Goal: Will remain free from infection Outcome: Progressing   Problem: Nutrition: Goal: Adequate nutrition will be maintained Outcome: Progressing   

## 2020-08-15 NOTE — Progress Notes (Signed)
PROGRESS NOTE    [name]   ZSW:[id number]  DOB: [birth year]  DOA: 08/09/2020 PCP: London Pepper, MD   Brief Narrative:  [name] [name] is a 59 y.o. 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   She was discharged on 07/26/20 after being treated for a left frontal lobe infarct and few frontal parietal and occipital watershed infarcts which presented as confusion . She as also noted to have fever of 102.9 and was found to have a left basilar pneumonia on CXR. She was sent home with her husband.  Since then she has stopped eating and has become severely weak and more confused.  In the ED she has underwent a CT of the chest and is found to have a left upper lobe cavitary lung lesion.  CT chest:  Large cavitary lesion with irregular walls in the left upper lobe abutting the left hilum with associated hilar and mediastinal necrotic adenopathy. 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 Findings of erosive change into the left bronchial tree are noted as well as ingrowth into the left pulmonary vein likely related to tumor thrombus.  Some associated left lower lobe infiltrate is noted likely related to central bronchial occlusion. Small left pleural effusion is noted as well.  Subjective: No new complaints.     Assessment & Plan:   Principal Problem:   Sepsis likely due to pneumonia  - this is likely a post obstructive pneumonia- cont Cefepime, Vanc and Flagyl- if blood cultures are negative tomorrow, will narrow antibiotics - 10/1> narrowed down to Cefepime only which has been stopped due to transition to comfort care  Active Problems:  Lung mass - due to inability to eat, severe weigth loss and poor activity, pulmonary recommends palliative care rather than a biopsy and treatment -  palliative care team has become involved- she is now a DNR- plan for either hospice home or home with hospice - I have had a discussion with her mother today and she is in agreement with the plan- her husband has opted for a hospice home- we will wait on an opening    Acute metabolic encephalopathy - CT head is negative for brain metastasis- MRI not performed but she may have more CVAs or metastasis- as mentioned above, the plan is for comfort care eventually - possibly also secondary to sepsis    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)     Anorexia &  Severe protein-calorie malnutrition Altamease Oiler: less than 60% of standard weight) - due to cancer and probable infection    Hypokalemia  K was 2.5 an admission - has been replaced  Hypernatremia due to dehydration - sodium 152- has improved to 145 with IVF    Time spent in minutes: 35  DVT prophylaxis:   Code Status: DNR Family Communication:  Disposition Plan:  Status is: Inpatient  Remains inpatient appropriate because:- waiting on hospice home bed   Dispo: The patient is from: Home              Anticipated d/c is to: tbd-  hospice home               Anticipated d/c date is: 2 days              Patient currently is not medically stable to d/c.      Consultants:  Pulmonary   Palliative care Procedures:    Antimicrobials:  Anti-infectives (From admission, onward)   Start     Dose/Rate Route Frequency Ordered Stop   08/11/20 1300  vancomycin (VANCOREADY) IVPB 500 mg/100 mL  Status:  Discontinued        500 mg 100 mL/hr over 60 Minutes Intravenous Every 24 hours 08/10/20 1158 08/13/20 0803   08/10/20 2300  ceFEPIme (MAXIPIME) 2 g in sodium chloride 0.9 % 100 mL IVPB  Status:  Discontinued        2 g 200 mL/hr over 30 Minutes Intravenous Every 12 hours 08/10/20 1158 08/13/20 1110   08/10/20 1900  metroNIDAZOLE (FLAGYL) IVPB 500 mg  Status:  Discontinued        500 mg 100 mL/hr over 60 Minutes  Intravenous Every 8 hours 08/10/20 1348 08/13/20 0803   08/10/20 1000  vancomycin (VANCOREADY) IVPB 750 mg/150 mL        750 mg 150 mL/hr over 60 Minutes Intravenous  Once 08/10/20 0946 08/10/20 1411   08/10/20 0945  ceFEPIme (MAXIPIME) 2 g in sodium chloride 0.9 % 100 mL IVPB        2 g 200 mL/hr over 30 Minutes Intravenous  Once 08/10/20 0939 08/10/20 1113   08/10/20 0945  metroNIDAZOLE (FLAGYL) IVPB 500 mg        500 mg 100 mL/hr over 60 Minutes Intravenous  Once 08/10/20 0939 08/10/20 1236   08/10/20 0945  vancomycin (VANCOCIN) IVPB 1000 mg/200 mL premix  Status:  Discontinued        1,000 mg 200 mL/hr over 60 Minutes Intravenous  Once 08/10/20 0939 08/10/20 0946       Objective: Vitals:   08/14/20 2001 08/15/20 0449 08/15/20 0513 08/15/20 0800  BP: (!) 156/88 (!) 169/92    Pulse: 99  100 94  Resp: 20     Temp: 98.4 F (36.9 C) 97.9 F (36.6 C)    TempSrc: Axillary Oral    SpO2: 100% 99% 100% 100%  Weight:   39.4 kg     Intake/Output Summary (Last 24 hours) at 08/15/2020 1542 Last data filed at 08/15/2020 1523 Gross per 24 hour  Intake 296 ml  Output --  Net 296 ml   Filed Weights   08/12/20 0417 08/14/20 0604 08/15/20 0513  Weight: 39.1 kg 39.4 kg 39.4 kg    Examination: Alert and oriented only to person, otherwise confused.     Data Reviewed: I have personally reviewed following labs and imaging studies  CBC: Recent Labs  Lab 08/10/20 0906 08/11/20 1409 08/12/20 1124 08/13/20 0824  WBC 16.1* 13.6* 12.0* 14.2*  NEUTROABS 12.3*  --   --   --   HGB 11.9* 12.1 13.2 11.1*  HCT 40.0 41.1 45.0 37.1  MCV 83.7 86.0 85.4 82.8  PLT 470* 369 310 102   Basic Metabolic Panel: Recent Labs  Lab 08/10/20 0906 08/10/20 1600 08/10/20 1700 08/11/20 1409 08/12/20 2036 08/13/20 0824  NA 152* 150*  --  145 144 145  K 2.5* 4.2  --  4.3 4.2 3.6  CL 104 110  --  108 115* 115*  CO2 32 26  --  24 20* 21*  GLUCOSE 143* 156*  --  149* 173* 128*  BUN 39* 37*  --   27* 19 17  CREATININE 0.80 0.84  --  0.65 0.68 0.63  CALCIUM 10.3 8.9  --  9.2 8.8* 8.9  MG 2.4  --  2.4  --   --   --  PHOS  --   --  2.9  --   --   --    GFR: Estimated Creatinine Clearance: 45.9 mL/min (by C-G formula based on SCr of 0.63 mg/dL). Liver Function Tests: Recent Labs  Lab 08/10/20 0906 08/12/20 2036 08/13/20 0824  AST 30 23 23   ALT 17 15 14   ALKPHOS 89 77 75  BILITOT 0.8 0.4 0.4  PROT 7.9 6.7 6.7  ALBUMIN 1.9* 1.5* 1.5*   No results for input(s): LIPASE, AMYLASE in the last 168 hours. No results for input(s): AMMONIA in the last 168 hours. Coagulation Profile: No results for input(s): INR, PROTIME in the last 168 hours. Cardiac Enzymes: No results for input(s): CKTOTAL, CKMB, CKMBINDEX, TROPONINI in the last 168 hours. BNP (last 3 results) No results for input(s): PROBNP in the last 8760 hours. HbA1C: No results for input(s): HGBA1C in the last 72 hours. CBG: Recent Labs  Lab 08/10/20 0911  GLUCAP 143*   Lipid Profile: No results for input(s): CHOL, HDL, LDLCALC, TRIG, CHOLHDL, LDLDIRECT in the last 72 hours. Thyroid Function Tests: No results for input(s): TSH, T4TOTAL, FREET4, T3FREE, THYROIDAB in the last 72 hours. Anemia Panel: No results for input(s): VITAMINB12, FOLATE, FERRITIN, TIBC, IRON, RETICCTPCT in the last 72 hours. Urine analysis:    Component Value Date/Time   COLORURINE YELLOW 08/10/2020 1150   APPEARANCEUR HAZY (A) 08/10/2020 1150   LABSPEC 1.025 08/10/2020 1150   PHURINE 5.0 08/10/2020 1150   GLUCOSEU NEGATIVE 08/10/2020 1150   HGBUR NEGATIVE 08/10/2020 1150   BILIRUBINUR NEGATIVE 08/10/2020 1150   KETONESUR 5 (A) 08/10/2020 1150   PROTEINUR 100 (A) 08/10/2020 1150   UROBILINOGEN 0.2 06/23/2013 1622   NITRITE NEGATIVE 08/10/2020 1150   LEUKOCYTESUR NEGATIVE 08/10/2020 1150   Sepsis Labs: @LABRCNTIP (procalcitonin:4,lacticidven:4) ) Recent Results (from the past 240 hour(s))  Culture, blood (routine x 2)     Status: None    Collection Time: 08/10/20  9:40 AM   Specimen: BLOOD  Result Value Ref Range Status   Specimen Description BLOOD LEFT ANTECUBITAL  Final   Special Requests   Final    BOTTLES DRAWN AEROBIC AND ANAEROBIC Blood Culture adequate volume   Culture   Final    NO GROWTH 5 DAYS Performed at [facility], [place] [address], [place], [place] [address]    Report Status 08/15/2020 FINAL  Final  Culture, blood (routine x 2)     Status: None   Collection Time: 08/10/20  9:45 AM   Specimen: BLOOD  Result Value Ref Range Status   Specimen Description BLOOD LEFT ANTECUBITAL  Final   Special Requests   Final    BOTTLES DRAWN AEROBIC AND ANAEROBIC Blood Culture adequate volume   Culture   Final    NO GROWTH 5 DAYS Performed at [facility], [place] [address] [place], [place] [address]    Report Status 08/15/2020 FINAL  Final  Respiratory Panel by RT PCR (Flu A&B, Covid) - Nasopharyngeal Swab     Status: None   Collection Time: 08/10/20 10:24 AM   Specimen: Nasopharyngeal Swab  Result Value Ref Range Status   SARS Coronavirus 2 by RT PCR NEGATIVE NEGATIVE Final    Comment: (NOTE) SARS-CoV-2 target nucleic acids are NOT DETECTED.  The SARS-CoV-2 RNA is generally detectable in upper respiratoy specimens during the acute phase of infection. The lowest concentration of SARS-CoV-2 viral copies this assay can detect is 131 copies/mL. A negative result does not preclude SARS-Cov-2 infection and should not  be used as the sole basis for treatment or other patient management decisions. A negative result may occur with  improper specimen collection/handling, submission of specimen other than nasopharyngeal swab, presence of viral mutation(s) within the areas targeted by this assay, and inadequate number of viral copies (<131 copies/mL). A negative result must be combined with clinical observations, patient history, and epidemiological information. The expected result is Negative.  Fact Sheet  for Patients:  PinkCheek.be  Fact Sheet for Healthcare Providers:  GravelBags.it  This test is no t yet approved or cleared by the [place] FDA and  has been authorized for detection and/or diagnosis of SARS-CoV-2 by FDA under an Emergency Use Authorization (EUA). This EUA will remain  in effect (meaning this test can be used) for the duration of the COVID-19 declaration under Section 564(b)(1) of the Act, 21 U.S.C. section 360bbb-3(b)(1), unless the authorization is terminated or revoked sooner.     Influenza A by PCR NEGATIVE NEGATIVE Final   Influenza B by PCR NEGATIVE NEGATIVE Final    Comment: (NOTE) The Xpert Xpress SARS-CoV-2/FLU/RSV assay is intended as an aid in  the diagnosis of influenza from Nasopharyngeal swab specimens and  should not be used as a sole basis for treatment. Nasal washings and  aspirates are unacceptable for Xpert Xpress SARS-CoV-2/FLU/RSV  testing.  Fact Sheet for Patients: PinkCheek.be  Fact Sheet for Healthcare Providers: GravelBags.it  This test is not yet approved or cleared by the [place] FDA and  has been authorized for detection and/or diagnosis of SARS-CoV-2 by  FDA under an Emergency Use Authorization (EUA). This EUA will remain  in effect (meaning this test can be used) for the duration of the  Covid-19 declaration under Section 564(b)(1) of the Act, 21  U.S.C. section 360bbb-3(b)(1), unless the authorization is  terminated or revoked. Performed at [facility], [place] [address], [place], [place] [address]          Radiology Studies: No results found.    Scheduled Meds: . dronabinol  2.5 mg Oral BID AC  . feeding supplement (ENSURE ENLIVE)  237 mL Oral TID BM  . lisinopril  20 mg Oral q morning - 10a  . megestrol  400 mg Oral BID   Continuous Infusions:    LOS: 5 days      Debbe Odea,  MD [facility] Pager: [email or web address] 08/15/2020, 3:42 PM

## 2020-08-15 NOTE — Progress Notes (Signed)
Manufacturing engineer [facility]) Hospital Liaison note.    Unfortunately, [facility] is unable to offer a room today. Hospital Liaison will follow up tomorrow or sooner if a room becomes available.    Please do not hesitate to call with questions.  Thank you for the opportunity to participate in this patient's care.   Domenic Moras, BSN, RN [facility] Liaison (listed on [facility]ION under Hospice[facility])    [phone]  [phone] (24h on call)

## 2020-08-16 NOTE — Progress Notes (Signed)
Manufacturing engineer [facility]) Hospital Liaison note.    Unfortunately, [facility] is unable to offer a room today. Discussed the possibility of a bed at [facility] in [place], but spouse Ronnie Doss feels being in [place] would be much more convenient for the family. Hospital Liaison will follow up tomorrow or sooner if a room becomes available.    Please do not hesitate to call with questions. Thank you for the opportunity to participate in this patient's care.  Chrislyn Edison Pace, BSN, RN [facility] Liaison (listed on [facility]ION under Hospice[facility])    [phone] (24h on call)

## 2020-08-16 NOTE — TOC Progression Note (Signed)
Transition of Care [facility]) - Progression Note    Patient Details  Name: [name] MRN: [id number] Date of Birth: Mar 15, 1961  Transition of Care [facility]) CM/SW [place], [place] Phone Number: 08/16/2020, 10:22 AM  Clinical Narrative:     CSW spoke with Chrislyn with [facility] and there is no bed availability today at [facility]. CSW to continue to follow and assist with discharge planning needs for patient.        Expected Discharge Plan and Services                                                 Social Determinants of Health (SDOH) Interventions    Readmission Risk Interventions No flowsheet data found.

## 2020-08-16 NOTE — Progress Notes (Signed)
PMT provider chart review. Patient is waiting on hospice bed at [facility]. No beds today. Spoke with [facility] liaison, [place]. No symptom needs. No current PMT provider needs. Encouraged liaison to call PMT provider if needs arise. Thank you.   NO CHARGE  Ihor Dow, [place], FNP-C Palliative Medicine Team  Phone: [phone] Fax: [phone]

## 2020-08-16 NOTE — Progress Notes (Signed)
PROGRESS NOTE    Reika Callanan   ZOX:[id number]  DOB: 29-Jun-1961  DOA: 08/09/2020 PCP: London Pepper, MD   Brief Narrative:  Shulamit Donofrio Rilynn Habel is a 59 y.o. 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   She was discharged on 07/26/20 after being treated for a left frontal lobe infarct and few frontal parietal and occipital watershed infarcts which presented as confusion . She as also noted to have fever of 102.9 and was found to have a left basilar pneumonia on CXR. She was sent home with her husband.  Since then she has stopped eating and has become severely weak and more confused.  In the ED she has underwent a CT of the chest and is found to have a left upper lobe cavitary lung lesion.  CT chest:  Large cavitary lesion with irregular walls in the left upper lobe abutting the left hilum with associated hilar and mediastinal necrotic adenopathy. 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 Findings of erosive change into the left bronchial tree are noted as well as ingrowth into the left pulmonary vein likely related to tumor thrombus.  Some associated left lower lobe infiltrate is noted likely related to central bronchial occlusion. Small left pleural effusion is noted as well.  Subjective: No complaints.    Assessment & Plan:   Principal Problem:   Sepsis likely due to pneumonia  - this is likely a post obstructive pneumonia- cont Cefepime, Vanc and Flagyl- if blood cultures are negative tomorrow, will narrow antibiotics - 10/1> narrowed down to Cefepime only which has been stopped due to transition to comfort care  Active Problems:  Lung mass - due to inability to eat, severe weigth loss and poor activity, pulmonary recommends palliative care rather than a biopsy and treatment -  palliative care team has become involved- she is now a DNR- plan for either hospice home or home with hospice - I have had a discussion with her mother today and she is in agreement with the plan- her husband has opted for a hospice home- we will wait on an opening    Acute metabolic encephalopathy - CT head is negative for brain metastasis- MRI not performed but she may have more CVAs or metastasis- as mentioned above, the plan is for comfort care eventually - possibly also secondary to sepsis    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)     Anorexia &  Severe protein-calorie malnutrition Altamease Oiler: less than 60% of standard weight) - due to cancer and probable infection    Hypokalemia  K was 2.5 an admission - has been replaced  Hypernatremia due to dehydration - sodium 152- has improved to 145 with IVF    Time spent in minutes: 35  DVT prophylaxis:   Code Status: DNR Family Communication:  Disposition Plan:  Status is: Inpatient  Remains inpatient appropriate because:- waiting on hospice home bed   Dispo: The patient is from: Home              Anticipated d/c is to: tbd-  hospice home               Anticipated d/c date is: 2 days              Patient currently is not medically stable to d/c.      Consultants:  Pulmonary   Palliative care Procedures:    Antimicrobials:  Anti-infectives (From admission, onward)   Start     Dose/Rate Route Frequency Ordered Stop   08/11/20 1300  vancomycin (VANCOREADY) IVPB 500 mg/100 mL  Status:  Discontinued        500 mg 100 mL/hr over 60 Minutes Intravenous Every 24 hours 08/10/20 1158 08/13/20 0803   08/10/20 2300  ceFEPIme (MAXIPIME) 2 g in sodium chloride 0.9 % 100 mL IVPB  Status:  Discontinued        2 g 200 mL/hr over 30 Minutes Intravenous Every 12 hours 08/10/20 1158 08/13/20 1110   08/10/20 1900  metroNIDAZOLE (FLAGYL) IVPB 500 mg  Status:  Discontinued        500 mg 100 mL/hr over 60 Minutes  Intravenous Every 8 hours 08/10/20 1348 08/13/20 0803   08/10/20 1000  vancomycin (VANCOREADY) IVPB 750 mg/150 mL        750 mg 150 mL/hr over 60 Minutes Intravenous  Once 08/10/20 0946 08/10/20 1411   08/10/20 0945  ceFEPIme (MAXIPIME) 2 g in sodium chloride 0.9 % 100 mL IVPB        2 g 200 mL/hr over 30 Minutes Intravenous  Once 08/10/20 0939 08/10/20 1113   08/10/20 0945  metroNIDAZOLE (FLAGYL) IVPB 500 mg        500 mg 100 mL/hr over 60 Minutes Intravenous  Once 08/10/20 0939 08/10/20 1236   08/10/20 0945  vancomycin (VANCOCIN) IVPB 1000 mg/200 mL premix  Status:  Discontinued        1,000 mg 200 mL/hr over 60 Minutes Intravenous  Once 08/10/20 0939 08/10/20 0946       Objective: Vitals:   08/15/20 0513 08/15/20 0800 08/16/20 0549 08/16/20 0900  BP:   (!) 138/97 (!) 154/84  Pulse: 100 94 (!) 102 99  Resp:   15 20  Temp:   97.9 F (36.6 C) 97.7 F (36.5 C)  TempSrc:   Axillary Oral  SpO2: 100% 100% 100% 100%  Weight: 39.4 kg  37.5 kg     Intake/Output Summary (Last 24 hours) at 08/16/2020 1517 Last data filed at 08/15/2020 1700 Gross per 24 hour  Intake 357 ml  Output --  Net 357 ml   Filed Weights   08/14/20 0604 08/15/20 0513 08/16/20 0549  Weight: 39.4 kg 39.4 kg 37.5 kg    Examination: Alert and oriented only to person, otherwise confused.     Data Reviewed: I have personally reviewed following labs and imaging studies  CBC: Recent Labs  Lab 08/10/20 0906 08/11/20 1409 08/12/20 1124 08/13/20 0824  WBC 16.1* 13.6* 12.0* 14.2*  NEUTROABS 12.3*  --   --   --   HGB 11.9* 12.1 13.2 11.1*  HCT 40.0 41.1 45.0 37.1  MCV 83.7 86.0 85.4 82.8  PLT 470* 369 310 983   Basic Metabolic Panel: Recent Labs  Lab 08/10/20 0906 08/10/20 1600 08/10/20 1700 08/11/20 1409 08/12/20 2036 08/13/20 0824  NA 152* 150*  --  145 144 145  K 2.5* 4.2  --  4.3 4.2 3.6  CL 104 110  --  108 115* 115*  CO2 32 26  --  24 20* 21*  GLUCOSE 143* 156*  --  149* 173* 128*   BUN 39* 37*  --  27* 19 17  CREATININE 0.80 0.84  --  0.65 0.68 0.63  CALCIUM 10.3 8.9  --  9.2 8.8* 8.9  MG 2.4  --  2.4  --   --   --  PHOS  --   --  2.9  --   --   --    GFR: Estimated Creatinine Clearance: 43.7 mL/min (by C-G formula based on SCr of 0.63 mg/dL). Liver Function Tests: Recent Labs  Lab 08/10/20 0906 08/12/20 2036 08/13/20 0824  AST 30 23 23   ALT 17 15 14   ALKPHOS 89 77 75  BILITOT 0.8 0.4 0.4  PROT 7.9 6.7 6.7  ALBUMIN 1.9* 1.5* 1.5*   No results for input(s): LIPASE, AMYLASE in the last 168 hours. No results for input(s): AMMONIA in the last 168 hours. Coagulation Profile: No results for input(s): INR, PROTIME in the last 168 hours. Cardiac Enzymes: No results for input(s): CKTOTAL, CKMB, CKMBINDEX, TROPONINI in the last 168 hours. BNP (last 3 results) No results for input(s): PROBNP in the last 8760 hours. HbA1C: No results for input(s): HGBA1C in the last 72 hours. CBG: Recent Labs  Lab 08/10/20 0911  GLUCAP 143*   Lipid Profile: No results for input(s): CHOL, HDL, LDLCALC, TRIG, CHOLHDL, LDLDIRECT in the last 72 hours. Thyroid Function Tests: No results for input(s): TSH, T4TOTAL, FREET4, T3FREE, THYROIDAB in the last 72 hours. Anemia Panel: No results for input(s): VITAMINB12, FOLATE, FERRITIN, TIBC, IRON, RETICCTPCT in the last 72 hours. Urine analysis:    Component Value Date/Time   COLORURINE YELLOW 08/10/2020 1150   APPEARANCEUR HAZY (A) 08/10/2020 1150   LABSPEC 1.025 08/10/2020 1150   PHURINE 5.0 08/10/2020 1150   GLUCOSEU NEGATIVE 08/10/2020 1150   HGBUR NEGATIVE 08/10/2020 1150   BILIRUBINUR NEGATIVE 08/10/2020 1150   KETONESUR 5 (A) 08/10/2020 1150   PROTEINUR 100 (A) 08/10/2020 1150   UROBILINOGEN 0.2 06/23/2013 1622   NITRITE NEGATIVE 08/10/2020 1150   LEUKOCYTESUR NEGATIVE 08/10/2020 1150   Sepsis Labs: @LABRCNTIP (procalcitonin:4,lacticidven:4) ) Recent Results (from the past 240 hour(s))  Culture, blood (routine x  2)     Status: None   Collection Time: 08/10/20  9:40 AM   Specimen: BLOOD  Result Value Ref Range Status   Specimen Description BLOOD LEFT ANTECUBITAL  Final   Special Requests   Final    BOTTLES DRAWN AEROBIC AND ANAEROBIC Blood Culture adequate volume   Culture   Final    NO GROWTH 5 DAYS Performed at [facility], [place] [address] [place], [place] [address]    Report Status 08/15/2020 FINAL  Final  Culture, blood (routine x 2)     Status: None   Collection Time: 08/10/20  9:45 AM   Specimen: BLOOD  Result Value Ref Range Status   Specimen Description BLOOD LEFT ANTECUBITAL  Final   Special Requests   Final    BOTTLES DRAWN AEROBIC AND ANAEROBIC Blood Culture adequate volume   Culture   Final    NO GROWTH 5 DAYS Performed at [facility], [place] [address] [place], [place] [address]    Report Status 08/15/2020 FINAL  Final  Respiratory Panel by RT PCR (Flu A&B, Covid) - Nasopharyngeal Swab     Status: None   Collection Time: 08/10/20 10:24 AM   Specimen: Nasopharyngeal Swab  Result Value Ref Range Status   SARS Coronavirus 2 by RT PCR NEGATIVE NEGATIVE Final    Comment: (NOTE) SARS-CoV-2 target nucleic acids are NOT DETECTED.  The SARS-CoV-2 RNA is generally detectable in upper respiratoy specimens during the acute phase of infection. The lowest concentration of SARS-CoV-2 viral copies this assay can detect is 131 copies/mL. A negative result does not preclude SARS-Cov-2 infection and should not  be used as the sole basis for treatment or other patient management decisions. A negative result may occur with  improper specimen collection/handling, submission of specimen other than nasopharyngeal swab, presence of viral mutation(s) within the areas targeted by this assay, and inadequate number of viral copies (<131 copies/mL). A negative result must be combined with clinical observations, patient history, and epidemiological information. The expected result is  Negative.  Fact Sheet for Patients:  PinkCheek.be  Fact Sheet for Healthcare Providers:  GravelBags.it  This test is no t yet approved or cleared by the [place] FDA and  has been authorized for detection and/or diagnosis of SARS-CoV-2 by FDA under an Emergency Use Authorization (EUA). This EUA will remain  in effect (meaning this test can be used) for the duration of the COVID-19 declaration under Section 564(b)(1) of the Act, 21 U.S.C. section 360bbb-3(b)(1), unless the authorization is terminated or revoked sooner.     Influenza A by PCR NEGATIVE NEGATIVE Final   Influenza B by PCR NEGATIVE NEGATIVE Final    Comment: (NOTE) The Xpert Xpress SARS-CoV-2/FLU/RSV assay is intended as an aid in  the diagnosis of influenza from Nasopharyngeal swab specimens and  should not be used as a sole basis for treatment. Nasal washings and  aspirates are unacceptable for Xpert Xpress SARS-CoV-2/FLU/RSV  testing.  Fact Sheet for Patients: PinkCheek.be  Fact Sheet for Healthcare Providers: GravelBags.it  This test is not yet approved or cleared by the [place] FDA and  has been authorized for detection and/or diagnosis of SARS-CoV-2 by  FDA under an Emergency Use Authorization (EUA). This EUA will remain  in effect (meaning this test can be used) for the duration of the  Covid-19 declaration under Section 564(b)(1) of the Act, 21  U.S.C. section 360bbb-3(b)(1), unless the authorization is  terminated or revoked. Performed at [facility], [place] [address], [place], [place] [address]          Radiology Studies: No results found.    Scheduled Meds: . dronabinol  2.5 mg Oral BID AC  . feeding supplement (ENSURE ENLIVE)  237 mL Oral TID BM  . lisinopril  20 mg Oral q morning - 10a  . megestrol  400 mg Oral BID   Continuous Infusions:    LOS: 6 days       Debbe Odea, MD [facility] Pager: [email or web address] 08/16/2020, 3:17 PM

## 2020-08-17 DIAGNOSIS — R630 Anorexia: Secondary | ICD-10-CM

## 2020-08-17 DIAGNOSIS — I10 Essential (primary) hypertension: Secondary | ICD-10-CM

## 2020-08-17 NOTE — Progress Notes (Signed)
Manufacturing engineer [facility]) Hospital Liaison note.     [facility] can accept this patient today.      [facility] will notify TOC when registration paperwork has been completed to arrange transport.   RN please call report to [phone].   Thank you,     Farrel Gordon, RN, CCM       [place] (listed on [place] under [facility])     [phone]

## 2020-08-17 NOTE — Progress Notes (Signed)
Spoke to [place] at [employer] [facility] and report given. Questions answered. [phone]

## 2020-08-17 NOTE — TOC Transition Note (Signed)
Transition of Care [facility]) - CM/SW Discharge Note   Patient Details  Name: [name] MRN: [id number] Date of Birth: 29-Apr-1961  Transition [facility]) CM/SW Contact:  Trula Ore, [place] Phone Number: 08/17/2020, 1:40 PM   Clinical Narrative:     Patient will DC to: [place] date: 08/17/2020  Family notified: Ronnie Doss  Transport by: Corey Harold  ?  Per MD patient ready for DC to [facility]. RN, patient, patient's family, and Bevely Palmer with [facility] notified of DC.RN given number for report tele#  [phone].DC packet on chart.DNR signed on chart. Ambulance transport requested for patient.  CSW signing off.  Final next level of care: [place] Barriers to Discharge: No Barriers Identified   Patient Goals and CMS Choice     Choice offered to / list presented to : Spouse (Donnell spouse)  Discharge Placement              Patient chooses bed at:  [facility]) Patient to be transferred to facility by: [place] Name of family member notified: Donnell Patient and family notified of of transfer: 08/17/20  Discharge Plan and Services                                     Social Determinants of Health (SDOH) Interventions     Readmission Risk Interventions No flowsheet data found.

## 2020-08-17 NOTE — Plan of Care (Signed)
?  Problem: Clinical Measurements: ?Goal: Will remain free from infection ?Outcome: Progressing ?  ?

## 2020-08-17 NOTE — Discharge Summary (Signed)
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  She was discharged on 07/26/20 after being treated for a left frontal lobe infarct and few frontal parietal and occipital watershed infarcts which presented as confusion . She as also noted to have fever of 102.9 and was found to have a left basilar pneumonia on CXR. She was sent home with her husband.  Since then she has stopped eating and has become severely weak and more confused.  In the ED she has underwent a CT of the chest and is found to have a left upper lobe cavitary lung lesion.  CT chest:  Large cavitary lesion with irregular walls in the left upper lobe abutting the left hilum with associated hilar and mediastinal necrotic adenopathy. 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 Findings of erosive change into the left bronchial tree are  noted as well as ingrowth into the left pulmonary vein likely related to tumor thrombus.  Some associated left lower lobe infiltrate is noted likely related to central bronchial occlusion. Small left pleural effusion is noted as well.   Hospital Course:  Principal Problem:   Sepsis likely due to pneumonia  - this is likely a post obstructive pneumonia- cont Cefepime, Vanc and Flagyl- if blood cultures are negative tomorrow, will narrow antibiotics - 10/1> narrowed down to Cefepime which has been stopped due to transition to comfort care  Active Problems:  Lung mass- likely cancer - has h/o smoking - due to inability to eat, severe weigth loss and poor activity, pulmonary recommends palliative care rather than a biopsy and treatment - palliative care team has become involved- she is now a DNR and being transitioned to a hospice home    Acute metabolic encephalopathy - CT head is negative for brain metastasis- MRI was not performed but she may have more CVAs or metastasis- as mentioned above, the plan is for comfort care  - possibly also secondary to sepsis as well - she is only oriented to self     Anorexia &  Severe protein-calorie malnutrition [name]: less than 60% of standard weight) - has completely stopped eating and drinking and will no eat even with encouragement  - this is likely due to cancer and probable infection    Hypokalemia  K was 2.5 an admission - has been replaced  Hypernatremia due to dehydration - sodium 152-  improved to 145 with IVF    Discharge Exam: Vitals:   08/16/20 0900 08/17/20  0449  BP: (!) 154/84 (!) 156/85  Pulse: 99 95  Resp: 20 18  Temp: 97.7 F (36.5 C) 97.7 F (36.5 C)  SpO2: 100% 99%   Vitals:   08/15/20 0800 08/16/20 0549 08/16/20 0900 08/17/20 0449  BP:  (!) 138/97 (!) 154/84 (!) 156/85  Pulse: 94 (!) 102 99 95  Resp:  15 20 18   Temp:  97.9 F (36.6 C) 97.7 F (36.5 C) 97.7 F (36.5 C)  TempSrc:  Axillary Oral Oral   SpO2: 100% 100% 100% 99%  Weight:  37.5 kg      General: Pt is alert, awake, not in acute distress Cardiovascular: RRR, S1/S2 +, no rubs, no gallops Respiratory: CTA bilaterally, no wheezing, no rhonchi Abdominal: Soft, NT, ND, bowel sounds + Extremities: no edema, no cyanosis   Discharge Instructions   Allergies as of 08/17/2020   No Known Allergies     Medication List    STOP taking these medications   aspirin EC 81 MG tablet   atorvastatin 80 MG tablet Commonly known as: LIPITOR   clopidogrel 75 MG tablet Commonly known as: PLAVIX   lisinopril 20 MG tablet Commonly known as: ZESTRIL   pantoprazole 40 MG tablet Commonly known as: PROTONIX   TUMS E-X PO     TAKE these medications   acetaminophen 500 MG tablet Commonly known as: TYLENOL Take 500 mg by mouth every 6 (six) hours as needed for moderate pain or headache.       No Known Allergies    CT Head Wo Contrast  Result Date: 08/10/2020 CLINICAL DATA:  Delirium with altered mental status EXAM: CT HEAD WITHOUT CONTRAST TECHNIQUE: Contiguous axial images were obtained from the base of the skull through the vertex without intravenous contrast. COMPARISON:  07/24/2020 and 07/23/2020 FINDINGS: Brain: Extensive RIGHT parietal and temporal encephalomalacia compatible with prior RIGHT MCA infarct. Signs of recent infarct involving the LEFT anterior frontal lobe with continued evolution of low attenuation. Mild edema without midline shift. Mild heterogeneity may reflect small foci of petechial hemorrhage. No signs of extra-axial fluid. No new area of abnormality. Atrophy and chronic microvascular ischemic change. Vascular: No hyperdense vessel or unexpected calcification. Skull: Normal. Negative for fracture or focal lesion. Sinuses/Orbits: Visualized paranasal sinuses and orbits without acute process. Other: None IMPRESSION: 1. Continued evolution of left anterior frontal lobe infarct. Mild heterogeneity reflects mild  petechial hemorrhage, no malignant hemorrhagic transformation or signs of mass effect. 2. Extensive RIGHT parietal and temporal encephalomalacia. 3. Atrophy and chronic microvascular ischemic change. Electronically Signed   By: Zetta Bills M.D.   On: 08/10/2020 11:53   CT Head Wo Contrast  Result Date: 07/23/2020 CLINICAL DATA:  Neuro deficit, acute, stroke suspected. Additional history provided: Sudden onset unsteady gait, stroke 2 years ago affecting right side EXAM: CT HEAD WITHOUT CONTRAST TECHNIQUE: Contiguous axial images were obtained from the base of the skull through the vertex without intravenous contrast. COMPARISON:  Noncontrast head CT 01/21/2005 FINDINGS: Brain: There is a 3.5 cm focus of cortical/subcortical hypodensity within the anterolateral left frontal lobe consistent with acute/early subacute infarction (for instance as seen on series 3, image 15). There is no significant mass effect at this time. Redemonstrated chronic cortically based infarct predominantly affecting portions of the right temporal and parietal lobes as well as posterior right insula. Chronic infarction changes also affect the posterior right thalamus. Background mild ill-defined hypoattenuation within the cerebral white matter is nonspecific, but consistent with chronic small vessel ischemic disease. There is no  acute intracranial hemorrhage. No extra-axial fluid collection. No evidence of intracranial mass. Vascular:No hyperdense vessel.  Atherosclerotic calcifications. Skull: Normal. Negative for fracture or focal lesion. Sinuses/Orbits: Visualized orbits show no acute finding. Minimal scattered paranasal sinus mucosal thickening. No significant mastoid effusion. These results were called by telephone at the time of interpretation on 07/23/2020 at 6:48 pm to provider [facility] , who verbally acknowledged these results. IMPRESSION: 3.5 cm acute/early subacute cortical/subcortical infarct within the anterolateral  left frontal lobe. Redemonstrated chronic right MCA vascular territory cortically based infarct. Chronic infarct within the posterior right thalamus. Background mild cerebral white matter chronic small vessel ischemic disease. Electronically Signed   By: Kellie Simmering DO   On: 07/23/2020 18:50   CT Chest W Contrast  Addendum Date: 08/10/2020   ADDENDUM REPORT: 08/10/2020 12:49 ADDENDUM: Additionally scattered thyroid nodules are noted. No followup recommended (ref: J Am Coll Radiol. 2015 Feb;12(2): 143-50). Electronically Signed   By: Inez Catalina M.D.   On: 08/10/2020 12:49   Result Date: 08/10/2020 CLINICAL DATA:  Follow-up masslike lesion on recent chest x-ray EXAM: CT CHEST WITH CONTRAST TECHNIQUE: Multidetector CT imaging of the chest was performed during intravenous contrast administration. CONTRAST:  44mL OMNIPAQUE IOHEXOL 300 MG/ML  SOLN COMPARISON:  Chest x-ray from earlier in the same day as well as 07/23/2020 FINDINGS: Cardiovascular: Thoracic aorta demonstrates some mild atherosclerotic calcifications although no aneurysmal dilatation is seen. No cardiac enlargement is noted. Coronary calcifications are noted. The pulmonary artery as visualized shows no definitive emboli although some mass effect upon the left main pulmonary artery is noted. Mediastinum/Nodes: Thoracic inlet demonstrates scattered small thyroid nodules the largest of which measures less than 1 cm. There is a large centrally necrotic mass lesion centered in the AP window consistent with lymphadenopathy. This measures at least 5.8 x 4.4 cm in greatest dimension. Additionally necrotic subcarinal adenopathy is noted measuring 3.1 by 3.3 cm. Scattered smaller nodes are seen. The trachea and esophagus are significantly deviated to the right secondary to these changes. Lungs/Pleura: Right lung demonstrates some emphysematous changes although no focal infiltrate or sizable effusion is seen. No parenchymal nodule is noted. In the left lung  however, there is a large cavitary lesion centrally with irregular thickened borders which is intimately apposed to the central adenopathy. This also shows erosive changes into the left mainstem bronchus which is significantly compressed by the adjacent adenopathy. Additionally there appears to be in growth of tumor into the pulmonary venous system on the left best visualized on image number 72 of series 3. Small left-sided pleural effusion is noted. Some patchy airspace opacity is noted likely related to central obstructive change. Upper Abdomen: Gallbladder has been surgically removed. Dilatation of the biliary tree is seen. Scattered calcifications are noted within the pancreas consistent with chronic pancreatitis. No other focal abnormality is noted. Musculoskeletal: Bony structures show no changes of metastatic disease. Degenerative change of the thoracic spine is noted. IMPRESSION: Large cavitary lesion with irregular walls in the left upper lobe abutting the left hilum with associated hilar and mediastinal necrotic adenopathy. 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 Findings of erosive change into the left bronchial tree are noted as well as ingrowth into the left pulmonary vein likely related to tumor thrombus. Some associated left lower lobe infiltrate is noted likely related to central bronchial occlusion. Small left pleural effusion is noted as well. Aortic Atherosclerosis (ICD10-I70.0) and Emphysema (ICD10-J43.9). Electronically Signed: By: Inez Catalina M.D. On: 08/10/2020  11:39   MR ANGIO HEAD WO CONTRAST  Result Date: 07/24/2020 CLINICAL DATA:  Follow-up examination for acute stroke. EXAM: MRI HEAD WITHOUT CONTRAST MRA HEAD WITHOUT CONTRAST MRA NECK WITHOUT AND WITH CONTRAST TECHNIQUE: Multiplanar, multiecho pulse sequences of the brain and surrounding structures were obtained without intravenous contrast. Angiographic images of the Circle of Willis  were obtained using MRA technique without intravenous contrast. Angiographic images of the neck were obtained using MRA technique without and with intravenous contrast. Carotid stenosis measurements (when applicable) are obtained utilizing NASCET criteria, using the distal internal carotid diameter as the denominator. CONTRAST:  10mL GADAVIST GADOBUTROL 1 MMOL/ML IV SOLN COMPARISON:  Prior CT from 07/23/2020. FINDINGS: MRI HEAD FINDINGS Brain: Examination mildly degraded by motion artifact. Diffuse prominence of the CSF containing spaces compatible generalized age-related cerebral atrophy. 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 Associated mild chronic hemosiderin staining within this region. Confluent wedge-shaped area of restricted diffusion measuring up to 3.5 cm in diameter seen involving the anterior left frontal lobe, corresponding with abnormality on prior CT. Finding consistent with and ischemic infarct, acute to early subacute in appearance. Multiple additional scattered acute to early subacute ischemic infarcts seen involving the cortical and subcortical left frontal, parietal, and occipital lobes, somewhat watershed in distribution. Few additional punctate acute to early subacute ischemic cortical infarct noted involving the right frontal lobe (series 5, images 86, 85). No associated hemorrhage or mass effect about these infarcts. Gray-white matter differentiation otherwise maintained. No acute intracranial hemorrhage. No mass lesion or midline shift. Mild ex vacuo dilatation of the right lateral ventricle related to the chronic right MCA territory infarct. No hydrocephalus. No extra-axial fluid collection. Pituitary gland suprasellar region within normal limits. Vascular: Major intracranial vascular flow voids are maintained. Skull and upper cervical spine: Craniocervical junction within normal limits. Bone  marrow signal intensity normal. No scalp soft tissue abnormality. Sinuses/Orbits: Globes and orbital soft tissues within normal limits. Mild scattered mucosal thickening noted within the ethmoidal air cells and maxillary sinuses. Paranasal sinuses are otherwise clear. No mastoid effusion. Inner ear structures grossly normal. Other: None. MRA HEAD FINDINGS ANTERIOR CIRCULATION: Examination mildly degraded by motion. Visualized distal cervical segments of the internal carotid arteries are patent with antegrade flow. Scattered atheromatous irregularity throughout the carotid siphons without flow-limiting stenosis. ICA termini well perfused. A1 segments patent bilaterally. Normal anterior communicating artery complex. Anterior cerebral arteries patent distally without significant stenosis. No M1 stenosis or occlusion. Negative MCA bifurcations. No proximal MCA branch occlusion. Scattered atheromatous irregularity throughout the distal MCA branches bilaterally. Right MCA branches somewhat attenuated as compared to the left, in keeping with the chronic right MCA territory infarct. POSTERIOR CIRCULATION: Vertebral arteries patent to the vertebrobasilar junction without stenosis. Right vertebral artery slightly dominant. Both picas patent proximally. Basilar mildly irregular but widely patent to its distal aspect without stenosis. Superior cerebral arteries patent bilaterally. 2 mm somewhat funnel shaped outpouching at the origin of the left SCA favored to reflect a vascular infundibulum. Both PCA supplied via the basilar or as well as prominent bilateral posterior communicating arteries. PCAs mildly irregular without proximal high-grade P1 and P2 stenosis. Focal severe distal right P3 stenosis noted (series 1224, image 5). No intracranial aneurysm. MRA NECK FINDINGS AORTIC ARCH: Visualized aortic arch of normal caliber. Bovine branching pattern with common origin of the right brachiocephalic and left common carotid  arteries noted. No flow-limiting stenosis about the origin of the great vessels. RIGHT CAROTID  SYSTEM: Right common carotid artery widely patent from its origin to the bifurcation. No significant atheromatous irregularity or narrowing about the right bifurcation. Right ICA widely patent distally without stenosis, dissection or occlusion. LEFT CAROTID SYSTEM: Left common carotid artery patent from its origin to the bifurcation without stenosis. Minimal atheromatous irregularity about the left bifurcation without significant stenosis. Left ICA widely patent distally without stenosis, dissection, or occlusion. VERTEBRAL ARTERIES: Both vertebral arteries arise from the subclavian arteries. No proximal subclavian artery stenosis. Vertebral arteries widely patent within the neck without stenosis, dissection or occlusion. IMPRESSION: MRI HEAD IMPRESSION: 1. Acute to early subacute anterior left frontal lobe infarct, left MCA distribution, corresponding with abnormality from prior CT. No associated hemorrhage or mass effect. 2. Multiple additional acute to early subacute ischemic infarcts involving the left frontal, parietal, and occipital lobes, watershed in distribution. Few additional punctate right cerebral cortical infarcts as above. No associated hemorrhage. 3. Underlying chronic right MCA infarct, with additional remote right thalamic infarct. 4. Underlying age-related cerebral atrophy. MRA HEAD IMPRESSION: 1. Negative intracranial MRA for large vessel occlusion. 2. Mild diffuse atheromatous irregularity throughout the intracranial circulation. No proximal high-grade or correctable stenosis. MRA NECK IMPRESSION: Negative MRA of the neck with wide patency of both carotid artery systems and vertebral arteries. No hemodynamically significant stenosis or other acute vascular abnormality. Electronically Signed   By: Jeannine Boga M.D.   On: 07/24/2020 02:40   MR ANGIO NECK W WO CONTRAST  Result Date:  07/24/2020 CLINICAL DATA:  Follow-up examination for acute stroke. EXAM: MRI HEAD WITHOUT CONTRAST MRA HEAD WITHOUT CONTRAST MRA NECK WITHOUT AND WITH CONTRAST TECHNIQUE: Multiplanar, multiecho pulse sequences of the brain and surrounding structures were obtained without intravenous contrast. Angiographic images of the Circle of Willis were obtained using MRA technique without intravenous contrast. Angiographic images of the neck were obtained using MRA technique without and with intravenous contrast. Carotid stenosis measurements (when applicable) are obtained utilizing NASCET criteria, using the distal internal carotid diameter as the denominator. CONTRAST:  52mL GADAVIST GADOBUTROL 1 MMOL/ML IV SOLN COMPARISON:  Prior CT from 07/23/2020. FINDINGS: MRI HEAD FINDINGS Brain: Examination mildly degraded by motion artifact. Diffuse prominence of the CSF containing spaces compatible generalized age-related cerebral atrophy. 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 Associated mild chronic hemosiderin staining within this region. Confluent wedge-shaped area of restricted diffusion measuring up to 3.5 cm in diameter seen involving the anterior left frontal lobe, corresponding with abnormality on prior CT. Finding consistent with and ischemic infarct, acute to early subacute in appearance. Multiple additional scattered acute to early subacute ischemic infarcts seen involving the cortical and subcortical left frontal, parietal, and occipital lobes, somewhat watershed in distribution. Few additional punctate acute to early subacute ischemic cortical infarct noted involving the right frontal lobe (series 5, images 86, 85). No associated hemorrhage or mass effect about these infarcts. Gray-white matter differentiation otherwise maintained. No acute intracranial hemorrhage. No mass lesion or midline shift. Mild ex vacuo dilatation of  the right lateral ventricle related to the chronic right MCA territory infarct. No hydrocephalus. No extra-axial fluid collection. Pituitary gland suprasellar region within normal limits. Vascular: Major intracranial vascular flow voids are maintained. Skull and upper cervical spine: Craniocervical junction within normal limits. Bone marrow signal intensity normal. No scalp soft tissue abnormality. Sinuses/Orbits: Globes and orbital soft tissues within normal limits. Mild scattered mucosal thickening noted within the ethmoidal air cells and maxillary sinuses. Paranasal sinuses are otherwise  clear. No mastoid effusion. Inner ear structures grossly normal. Other: None. MRA HEAD FINDINGS ANTERIOR CIRCULATION: Examination mildly degraded by motion. Visualized distal cervical segments of the internal carotid arteries are patent with antegrade flow. Scattered atheromatous irregularity throughout the carotid siphons without flow-limiting stenosis. ICA termini well perfused. A1 segments patent bilaterally. Normal anterior communicating artery complex. Anterior cerebral arteries patent distally without significant stenosis. No M1 stenosis or occlusion. Negative MCA bifurcations. No proximal MCA branch occlusion. Scattered atheromatous irregularity throughout the distal MCA branches bilaterally. Right MCA branches somewhat attenuated as compared to the left, in keeping with the chronic right MCA territory infarct. POSTERIOR CIRCULATION: Vertebral arteries patent to the vertebrobasilar junction without stenosis. Right vertebral artery slightly dominant. Both picas patent proximally. Basilar mildly irregular but widely patent to its distal aspect without stenosis. Superior cerebral arteries patent bilaterally. 2 mm somewhat funnel shaped outpouching at the origin of the left SCA favored to reflect a vascular infundibulum. Both PCA supplied via the basilar or as well as prominent bilateral posterior communicating arteries. PCAs  mildly irregular without proximal high-grade P1 and P2 stenosis. Focal severe distal right P3 stenosis noted (series 1224, image 5). No intracranial aneurysm. MRA NECK FINDINGS AORTIC ARCH: Visualized aortic arch of normal caliber. Bovine branching pattern with common origin of the right brachiocephalic and left common carotid arteries noted. No flow-limiting stenosis about the origin of the great vessels. RIGHT CAROTID SYSTEM: Right common carotid artery widely patent from its origin to the bifurcation. No significant atheromatous irregularity or narrowing about the right bifurcation. Right ICA widely patent distally without stenosis, dissection or occlusion. LEFT CAROTID SYSTEM: Left common carotid artery patent from its origin to the bifurcation without stenosis. Minimal atheromatous irregularity about the left bifurcation without significant stenosis. Left ICA widely patent distally without stenosis, dissection, or occlusion. VERTEBRAL ARTERIES: Both vertebral arteries arise from the subclavian arteries. No proximal subclavian artery stenosis. Vertebral arteries widely patent within the neck without stenosis, dissection or occlusion. IMPRESSION: MRI HEAD IMPRESSION: 1. Acute to early subacute anterior left frontal lobe infarct, left MCA distribution, corresponding with abnormality from prior CT. No associated hemorrhage or mass effect. 2. Multiple additional acute to early subacute ischemic infarcts involving the left frontal, parietal, and occipital lobes, watershed in distribution. Few additional punctate right cerebral cortical infarcts as above. No associated hemorrhage. 3. Underlying chronic right MCA infarct, with additional remote right thalamic infarct. 4. Underlying age-related cerebral atrophy. MRA HEAD IMPRESSION: 1. Negative intracranial MRA for large vessel occlusion. 2. Mild diffuse atheromatous irregularity throughout the intracranial circulation. No proximal high-grade or correctable stenosis.  MRA NECK IMPRESSION: Negative MRA of the neck with wide patency of both carotid artery systems and vertebral arteries. No hemodynamically significant stenosis or other acute vascular abnormality. Electronically Signed   By: Jeannine Boga M.D.   On: 07/24/2020 02:40   MR BRAIN WO CONTRAST  Result Date: 07/24/2020 CLINICAL DATA:  Follow-up examination for acute stroke. EXAM: MRI HEAD WITHOUT CONTRAST MRA HEAD WITHOUT CONTRAST MRA NECK WITHOUT AND WITH CONTRAST TECHNIQUE: Multiplanar, multiecho pulse sequences of the brain and surrounding structures were obtained without intravenous contrast. Angiographic images of the Circle of Willis were obtained using MRA technique without intravenous contrast. Angiographic images of the neck were obtained using MRA technique without and with intravenous contrast. Carotid stenosis measurements (when applicable) are obtained utilizing NASCET criteria, using the distal internal carotid diameter as the denominator. CONTRAST:  62mL GADAVIST GADOBUTROL 1 MMOL/ML IV SOLN COMPARISON:  Prior CT from 07/23/2020. FINDINGS: MRI  HEAD FINDINGS Brain: Examination mildly degraded by motion artifact. Diffuse prominence of the CSF containing spaces compatible generalized age-related cerebral atrophy. 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 Associated mild chronic hemosiderin staining within this region. Confluent wedge-shaped area of restricted diffusion measuring up to 3.5 cm in diameter seen involving the anterior left frontal lobe, corresponding with abnormality on prior CT. Finding consistent with and ischemic infarct, acute to early subacute in appearance. Multiple additional scattered acute to early subacute ischemic infarcts seen involving the cortical and subcortical left frontal, parietal, and occipital lobes, somewhat watershed in distribution. Few additional punctate acute to early  subacute ischemic cortical infarct noted involving the right frontal lobe (series 5, images 86, 85). No associated hemorrhage or mass effect about these infarcts. Gray-white matter differentiation otherwise maintained. No acute intracranial hemorrhage. No mass lesion or midline shift. Mild ex vacuo dilatation of the right lateral ventricle related to the chronic right MCA territory infarct. No hydrocephalus. No extra-axial fluid collection. Pituitary gland suprasellar region within normal limits. Vascular: Major intracranial vascular flow voids are maintained. Skull and upper cervical spine: Craniocervical junction within normal limits. Bone marrow signal intensity normal. No scalp soft tissue abnormality. Sinuses/Orbits: Globes and orbital soft tissues within normal limits. Mild scattered mucosal thickening noted within the ethmoidal air cells and maxillary sinuses. Paranasal sinuses are otherwise clear. No mastoid effusion. Inner ear structures grossly normal. Other: None. MRA HEAD FINDINGS ANTERIOR CIRCULATION: Examination mildly degraded by motion. Visualized distal cervical segments of the internal carotid arteries are patent with antegrade flow. Scattered atheromatous irregularity throughout the carotid siphons without flow-limiting stenosis. ICA termini well perfused. A1 segments patent bilaterally. Normal anterior communicating artery complex. Anterior cerebral arteries patent distally without significant stenosis. No M1 stenosis or occlusion. Negative MCA bifurcations. No proximal MCA branch occlusion. Scattered atheromatous irregularity throughout the distal MCA branches bilaterally. Right MCA branches somewhat attenuated as compared to the left, in keeping with the chronic right MCA territory infarct. POSTERIOR CIRCULATION: Vertebral arteries patent to the vertebrobasilar junction without stenosis. Right vertebral artery slightly dominant. Both picas patent proximally. Basilar mildly irregular but widely  patent to its distal aspect without stenosis. Superior cerebral arteries patent bilaterally. 2 mm somewhat funnel shaped outpouching at the origin of the left SCA favored to reflect a vascular infundibulum. Both PCA supplied via the basilar or as well as prominent bilateral posterior communicating arteries. PCAs mildly irregular without proximal high-grade P1 and P2 stenosis. Focal severe distal right P3 stenosis noted (series 1224, image 5). No intracranial aneurysm. MRA NECK FINDINGS AORTIC ARCH: Visualized aortic arch of normal caliber. Bovine branching pattern with common origin of the right brachiocephalic and left common carotid arteries noted. No flow-limiting stenosis about the origin of the great vessels. RIGHT CAROTID SYSTEM: Right common carotid artery widely patent from its origin to the bifurcation. No significant atheromatous irregularity or narrowing about the right bifurcation. Right ICA widely patent distally without stenosis, dissection or occlusion. LEFT CAROTID SYSTEM: Left common carotid artery patent from its origin to the bifurcation without stenosis. Minimal atheromatous irregularity about the left bifurcation without significant stenosis. Left ICA widely patent distally without stenosis, dissection, or occlusion. VERTEBRAL ARTERIES: Both vertebral arteries arise from the subclavian arteries. No proximal subclavian artery stenosis. Vertebral arteries widely patent within the neck without stenosis, dissection or occlusion. IMPRESSION: MRI HEAD IMPRESSION: 1. Acute to early subacute anterior left frontal lobe infarct, left MCA distribution, corresponding with abnormality from prior  CT. No associated hemorrhage or mass effect. 2. Multiple additional acute to early subacute ischemic infarcts involving the left frontal, parietal, and occipital lobes, watershed in distribution. Few additional punctate right cerebral cortical infarcts as above. No associated hemorrhage. 3. Underlying chronic right  MCA infarct, with additional remote right thalamic infarct. 4. Underlying age-related cerebral atrophy. MRA HEAD IMPRESSION: 1. Negative intracranial MRA for large vessel occlusion. 2. Mild diffuse atheromatous irregularity throughout the intracranial circulation. No proximal high-grade or correctable stenosis. MRA NECK IMPRESSION: Negative MRA of the neck with wide patency of both carotid artery systems and vertebral arteries. No hemodynamically significant stenosis or other acute vascular abnormality. Electronically Signed   By: Jeannine Boga M.D.   On: 07/24/2020 02:40   DG Chest Portable 1 View  Result Date: 08/10/2020 CLINICAL DATA:  Sepsis EXAM: PORTABLE CHEST 1 VIEW COMPARISON:  07/23/2020 FINDINGS: Cardiac shadow is stable. Mild elevation of left hemidiaphragm is again seen. Patchy somewhat rounded density is again seen within the right lung. No new focal infiltrate is seen. No sizable effusion is noted. IMPRESSION: Patchy somewhat rounded density in the left mid to lower lung relatively stable from the prior exam. Although persistent pneumonia deserves consideration the possibility of neoplasm remains. CT of the chest with contrast is recommended for further evaluation. Electronically Signed   By: Inez Catalina M.D.   On: 08/10/2020 10:01   DG Chest Port 1 View  Result Date: 07/23/2020 CLINICAL DATA:  Sepsis EXAM: PORTABLE CHEST 1 VIEW COMPARISON:  01/19/2005 FINDINGS: Single frontal view of the chest demonstrates dense consolidation at the left lung base compatible with pneumonia or aspiration. Diffuse interstitial prominence elsewhere within the lungs likely reflects scarring. No effusion or pneumothorax. There is elevation of the left hemidiaphragm. The cardiac silhouette is unremarkable. IMPRESSION: 1. Dense left basilar consolidation, compatible with pneumonia or aspiration. Followup PA and lateral chest X-ray is recommended in 3-4 weeks following trial of antibiotic therapy to ensure  resolution and exclude underlying malignancy. Electronically Signed   By: Randa Ngo M.D.   On: 07/23/2020 16:55   VAS [place] TRANSCRANIAL DOPPLER W BUBBLES  Result Date: 07/27/2020  Transcranial Doppler with Bubble Indications: Stroke. Performing Technologist: Abram Sander RVS  Examination Guidelines: A complete evaluation includes B-mode imaging, spectral Doppler, color Doppler, and power Doppler as needed of all accessible portions of each vessel. Bilateral testing is considered an integral part of a complete examination. Limited examinations for reoccurring indications may be performed as noted.  Summary: No HITS at rest or during Valsalva. Negative transcranial Doppler Bubble study with no evidence of right to left intracardiac communication.  A vascular evaluation was performed. The right Ophthalmic was studied. An IV was inserted into the patient's left forearm . Verbal informed consent was obtained.  Negative TCD Bubble study *See table(s) above for TCD measurements and observations.  Diagnosing physician: Antony Contras MD Electronically signed by Antony Contras MD on 07/27/2020 at 12:34:04 PM.    Final    ECHOCARDIOGRAM COMPLETE BUBBLE STUDY  Result Date: 07/24/2020    ECHOCARDIOGRAM REPORT   Patient Name:   [name] Date of Exam: 07/24/2020 Medical Rec #:  [id number]     Height:       62.3 in Accession #:    [id number]    Weight:       132.0 lb Date of Birth:  February 21, 1961     BSA:          1.607 m Patient Age:    46 years  BP:           108/67 mmHg Patient Gender: F             HR:           104 bpm. Exam Location:  Inpatient Procedure: 2D Echo and Saline Contrast Bubble Study Indications:    stroke 434.91  History:        Patient has prior history of Echocardiogram examinations, most                 recent 01/18/2005.  Sonographer:    Johny Chess Referring Phys: [id number] [place]  1. Left ventricular ejection fraction, by estimation, is 60 to 65%. The left ventricle has  normal function. The left ventricle has no regional wall motion abnormalities. Left ventricular diastolic parameters were normal.  2. Right ventricular systolic function is normal. The right ventricular size is normal. There is normal pulmonary artery systolic pressure.  3. The mitral valve is normal in structure. Trivial mitral valve regurgitation. No evidence of mitral stenosis.  4. The aortic valve was not well visualized. Aortic valve regurgitation is not visualized. Mild to moderate aortic valve sclerosis/calcification is present, without any evidence of aortic stenosis.  5. The inferior vena cava is normal in size with greater than 50% respiratory variability, suggesting right atrial pressure of 3 mmHg.  6. Agitated saline contrast bubble study was negative, with no evidence of any interatrial shunt. FINDINGS  Left Ventricle: Left ventricular ejection fraction, by estimation, is 60 to 65%. The left ventricle has normal function. The left ventricle has no regional wall motion abnormalities. The left ventricular internal cavity size was normal in size. There is  no left ventricular hypertrophy. Left ventricular diastolic parameters were normal. Normal left ventricular filling pressure. Right Ventricle: The right ventricular size is normal. No increase in right ventricular wall thickness. Right ventricular systolic function is normal. There is normal pulmonary artery systolic pressure. The tricuspid regurgitant velocity is 2.39 m/s, and  with an assumed right atrial pressure of 3 mmHg, the estimated right ventricular systolic pressure is 58.5 mmHg. Left Atrium: Left atrial size was normal in size. Right Atrium: Right atrial size was normal in size. Pericardium: There is no evidence of pericardial effusion. Mitral Valve: The mitral valve is normal in structure. Mild mitral annular calcification. Trivial mitral valve regurgitation. No evidence of mitral valve stenosis. Tricuspid Valve: The tricuspid valve is normal  in structure. Tricuspid valve regurgitation is trivial. No evidence of tricuspid stenosis. Aortic Valve: The aortic valve was not well visualized. Aortic valve regurgitation is not visualized. Mild to moderate aortic valve sclerosis/calcification is present, without any evidence of aortic stenosis. Pulmonic Valve: The pulmonic valve was normal in structure. Pulmonic valve regurgitation is not visualized. No evidence of pulmonic stenosis. Aorta: The aortic root is normal in size and structure. Venous: The inferior vena cava is normal in size with greater than 50% respiratory variability, suggesting right atrial pressure of 3 mmHg. IAS/Shunts: No atrial level shunt detected by color flow Doppler. Agitated saline contrast was given intravenously to evaluate for intracardiac shunting. Agitated saline contrast bubble study was negative, with no evidence of any interatrial shunt. There  is no evidence of a patent foramen ovale. There is no evidence of an atrial septal defect.  LEFT VENTRICLE PLAX 2D LVIDd:         3.60 cm  Diastology LVIDs:         2.40 cm  LV e' medial:  10.40 cm/s LV PW:         1.00 cm  LV E/e' medial:  7.4 LV IVS:        0.90 cm  LV e' lateral:   10.70 cm/s LVOT diam:     2.10 cm  LV E/e' lateral: 7.2 LVOT Area:     3.46 cm  RIGHT VENTRICLE             IVC RV S prime:     13.20 cm/s  IVC diam: 1.10 cm TAPSE (M-mode): 1.7 cm LEFT ATRIUM             Index       RIGHT ATRIUM          Index LA diam:        3.00 cm 1.87 cm/m  RA Area:     8.63 cm LA Vol (A2C):   26.5 ml 16.49 ml/m RA Volume:   15.70 ml 9.77 ml/m LA Vol (A4C):   26.9 ml 16.74 ml/m LA Biplane Vol: 26.5 ml 16.49 ml/m   AORTA Ao Asc diam: 3.00 cm MITRAL VALVE               TRICUSPID VALVE MV Area (PHT): 3.53 cm    TR Peak grad:   22.8 mmHg MV Decel Time: 215 msec    TR Vmax:        239.00 cm/s MV E velocity: 77.10 cm/s MV A velocity: 78.00 cm/s  SHUNTS MV E/A ratio:  0.99        Systemic Diam: 2.10 cm Fransico Him MD Electronically  signed by Fransico Him MD Signature Date/Time: 07/24/2020/4:09:20 PM    Final    VAS [place] LOWER EXTREMITY VENOUS (DVT)  Result Date: 07/27/2020  Lower Venous DVTStudy Indications: Stroke.  Comparison Study: no prior Performing Technologist: Abram Sander RVS  Examination Guidelines: A complete evaluation includes B-mode imaging, spectral Doppler, color Doppler, and power Doppler as needed of all accessible portions of each vessel. Bilateral testing is considered an integral part of a complete examination. Limited examinations for reoccurring indications may be performed as noted. The reflux portion of the exam is performed with the patient in reverse Trendelenburg.  +---------+---------------+---------+-----------+----------+--------------+ RIGHT    CompressibilityPhasicitySpontaneityPropertiesThrombus Aging +---------+---------------+---------+-----------+----------+--------------+ CFV      Full           Yes      Yes                                 +---------+---------------+---------+-----------+----------+--------------+ SFJ      Full                                                        +---------+---------------+---------+-----------+----------+--------------+ FV Prox  Full                                                        +---------+---------------+---------+-----------+----------+--------------+ FV Mid   Full                                                        +---------+---------------+---------+-----------+----------+--------------+  FV DistalFull                                                        +---------+---------------+---------+-----------+----------+--------------+ PFV      Full                                                        +---------+---------------+---------+-----------+----------+--------------+ POP      Full           Yes      Yes                                  +---------+---------------+---------+-----------+----------+--------------+ PTV      Full                                                        +---------+---------------+---------+-----------+----------+--------------+ PERO     Full                                                        +---------+---------------+---------+-----------+----------+--------------+   +---------+---------------+---------+-----------+----------+--------------+ LEFT     CompressibilityPhasicitySpontaneityPropertiesThrombus Aging +---------+---------------+---------+-----------+----------+--------------+ CFV      Full           Yes      Yes                                 +---------+---------------+---------+-----------+----------+--------------+ SFJ      Full                                                        +---------+---------------+---------+-----------+----------+--------------+ FV Prox  Full                                                        +---------+---------------+---------+-----------+----------+--------------+ FV Mid   Full                                                        +---------+---------------+---------+-----------+----------+--------------+ FV DistalFull                                                        +---------+---------------+---------+-----------+----------+--------------+  PFV      Full                                                        +---------+---------------+---------+-----------+----------+--------------+ POP      Full           Yes      Yes                                 +---------+---------------+---------+-----------+----------+--------------+ PTV      Full                                                        +---------+---------------+---------+-----------+----------+--------------+ PERO     Full                                                         +---------+---------------+---------+-----------+----------+--------------+     Summary: BILATERAL: - No evidence of deep vein thrombosis seen in the lower extremities, bilaterally. - No evidence of superficial venous thrombosis in the lower extremities, bilaterally. -   *See table(s) above for measurements and observations. Electronically signed by Curt Jews MD on 07/27/2020 at 5:07:19 PM.    Final      The results of significant diagnostics from this hospitalization (including imaging, microbiology, ancillary and laboratory) are listed below for reference.     Microbiology: Recent Results (from the past 240 hour(s))  Culture, blood (routine x 2)     Status: None   Collection Time: 08/10/20  9:40 AM   Specimen: BLOOD  Result Value Ref Range Status   Specimen Description BLOOD LEFT ANTECUBITAL  Final   Special Requests   Final    BOTTLES DRAWN AEROBIC AND ANAEROBIC Blood Culture adequate volume   Culture   Final    NO GROWTH 5 DAYS Performed at [facility], [address] [place], [place] [address]    Report Status 08/15/2020 FINAL  Final  Culture, blood (routine x 2)     Status: None   Collection Time: 08/10/20  9:45 AM   Specimen: BLOOD  Result Value Ref Range Status   Specimen Description BLOOD LEFT ANTECUBITAL  Final   Special Requests   Final    BOTTLES DRAWN AEROBIC AND ANAEROBIC Blood Culture adequate volume   Culture   Final    NO GROWTH 5 DAYS Performed at [facility], [place] [address], [place], [place] [address]    Report Status 08/15/2020 FINAL  Final  Respiratory Panel by RT PCR (Flu A&B, Covid) - Nasopharyngeal Swab     Status: None   Collection Time: 08/10/20 10:24 AM   Specimen: Nasopharyngeal Swab  Result Value Ref Range Status   SARS Coronavirus 2 by RT PCR NEGATIVE NEGATIVE Final    Comment: (NOTE) SARS-CoV-2 target nucleic acids are NOT DETECTED.  The SARS-CoV-2 RNA is generally detectable in upper respiratoy specimens during the acute phase of  infection. The lowest concentration of SARS-CoV-2 viral copies this assay can detect is 131 copies/mL. A negative result does not preclude SARS-Cov-2 infection and should not be used as the sole basis for treatment or other patient management decisions. A negative result may occur with  improper specimen collection/handling, submission of specimen other than nasopharyngeal swab, presence of viral mutation(s) within the areas targeted by this assay, and inadequate number of viral copies (<131 copies/mL). A negative result must be combined with clinical observations, patient history, and epidemiological information. The expected result is Negative.  Fact Sheet for Patients:  PinkCheek.be  Fact Sheet for Healthcare Providers:  GravelBags.it  This test is no t yet approved or cleared by the [place] FDA and  has been authorized for detection and/or diagnosis of SARS-CoV-2 by FDA under an Emergency Use Authorization (EUA). This EUA will remain  in effect (meaning this test can be used) for the duration of the COVID-19 declaration under Section 564(b)(1) of the Act, 21 U.S.C. section 360bbb-3(b)(1), unless the authorization is terminated or revoked sooner.     Influenza A by PCR NEGATIVE NEGATIVE Final   Influenza B by PCR NEGATIVE NEGATIVE Final    Comment: (NOTE) The Xpert Xpress SARS-CoV-2/FLU/RSV assay is intended as an aid in  the diagnosis of influenza from Nasopharyngeal swab specimens and  should not be used as a sole basis for treatment. Nasal washings and  aspirates are unacceptable for Xpert Xpress SARS-CoV-2/FLU/RSV  testing.  Fact Sheet for Patients: PinkCheek.be  Fact Sheet for Healthcare Providers: GravelBags.it  This test is not yet approved or cleared by the [place] FDA and  has been authorized for detection and/or diagnosis of SARS-CoV-2  by  FDA under an Emergency Use Authorization (EUA). This EUA will remain  in effect (meaning this test can be used) for the duration of the  Covid-19 declaration under Section 564(b)(1) of the Act, 21  U.S.C. section 360bbb-3(b)(1), unless the authorization is  terminated or revoked. Performed at [facility], [place] [address], [place], [place] [address]      Labs: BNP (last 3 results) No results for input(s): BNP in the last 8760 hours. Basic Metabolic Panel: Recent Labs  Lab 08/10/20 1600 08/10/20 1700 08/11/20 1409 08/12/20 2036 08/13/20 0824  NA 150*  --  145 144 145  K 4.2  --  4.3 4.2 3.6  CL 110  --  108 115* 115*  CO2 26  --  24 20* 21*  GLUCOSE 156*  --  149* 173* 128*  BUN 37*  --  27* 19 17  CREATININE 0.84  --  0.65 0.68 0.63  CALCIUM 8.9  --  9.2 8.8* 8.9  MG  --  2.4  --   --   --   PHOS  --  2.9  --   --   --    Liver Function Tests: Recent Labs  Lab 08/12/20 2036 08/13/20 0824  AST 23 23  ALT 15 14  ALKPHOS 77 75  BILITOT 0.4 0.4  PROT 6.7 6.7  ALBUMIN 1.5* 1.5*   No results for input(s): LIPASE, AMYLASE in the last 168 hours. No results for input(s): AMMONIA in the last 168 hours. CBC: Recent Labs  Lab 08/11/20 1409 08/12/20 1124 08/13/20 0824  WBC 13.6* 12.0* 14.2*  HGB 12.1 13.2 11.1*  HCT 41.1 45.0 37.1  MCV 86.0 85.4 82.8  PLT 369 310 329   Cardiac Enzymes: No results for input(s): CKTOTAL, CKMB, CKMBINDEX, TROPONINI in  the last 168 hours. BNP: Invalid input(s): POCBNP CBG: No results for input(s): GLUCAP in the last 168 hours. D-Dimer No results for input(s): DDIMER in the last 72 hours. Hgb A1c No results for input(s): HGBA1C in the last 72 hours. Lipid Profile No results for input(s): CHOL, HDL, LDLCALC, TRIG, CHOLHDL, LDLDIRECT in the last 72 hours. Thyroid function studies No results for input(s): TSH, T4TOTAL, T3FREE, THYROIDAB in the last 72 hours.  Invalid input(s): FREET3 Anemia work up No results for  input(s): VITAMINB12, FOLATE, FERRITIN, TIBC, IRON, RETICCTPCT in the last 72 hours. Urinalysis    Component Value Date/Time   COLORURINE YELLOW 08/10/2020 1150   APPEARANCEUR HAZY (A) 08/10/2020 1150   LABSPEC 1.025 08/10/2020 1150   PHURINE 5.0 08/10/2020 1150   GLUCOSEU NEGATIVE 08/10/2020 1150   HGBUR NEGATIVE 08/10/2020 1150   BILIRUBINUR NEGATIVE 08/10/2020 1150   KETONESUR 5 (A) 08/10/2020 1150   PROTEINUR 100 (A) 08/10/2020 1150   UROBILINOGEN 0.2 06/23/2013 1622   NITRITE NEGATIVE 08/10/2020 1150   LEUKOCYTESUR NEGATIVE 08/10/2020 1150   Sepsis Labs Invalid input(s): PROCALCITONIN,  WBC,  LACTICIDVEN Microbiology Recent Results (from the past 240 hour(s))  Culture, blood (routine x 2)     Status: None   Collection Time: 08/10/20  9:40 AM   Specimen: BLOOD  Result Value Ref Range Status   Specimen Description BLOOD LEFT ANTECUBITAL  Final   Special Requests   Final    BOTTLES DRAWN AEROBIC AND ANAEROBIC Blood Culture adequate volume   Culture   Final    NO GROWTH 5 DAYS Performed at [facility], [address], [place], [place] [address]    Report Status 08/15/2020 FINAL  Final  Culture, blood (routine x 2)     Status: None   Collection Time: 08/10/20  9:45 AM   Specimen: BLOOD  Result Value Ref Range Status   Specimen Description BLOOD LEFT ANTECUBITAL  Final   Special Requests   Final    BOTTLES DRAWN AEROBIC AND ANAEROBIC Blood Culture adequate volume   Culture   Final    NO GROWTH 5 DAYS Performed at [facility], [place] [address] [place], [place] [address]    Report Status 08/15/2020 FINAL  Final  Respiratory Panel by RT PCR (Flu A&B, Covid) - Nasopharyngeal Swab     Status: None   Collection Time: 08/10/20 10:24 AM   Specimen: Nasopharyngeal Swab  Result Value Ref Range Status   SARS Coronavirus 2 by RT PCR NEGATIVE NEGATIVE Final    Comment: (NOTE) SARS-CoV-2 target nucleic acids are NOT DETECTED.  The SARS-CoV-2 RNA is generally  detectable in upper respiratoy specimens during the acute phase of infection. The lowest concentration of SARS-CoV-2 viral copies this assay can detect is 131 copies/mL. A negative result does not preclude SARS-Cov-2 infection and should not be used as the sole basis for treatment or other patient management decisions. A negative result may occur with  improper specimen collection/handling, submission of specimen other than nasopharyngeal swab, presence of viral mutation(s) within the areas targeted by this assay, and inadequate number of viral copies (<131 copies/mL). A negative result must be combined with clinical observations, patient history, and epidemiological information. The expected result is Negative.  Fact Sheet for Patients:  PinkCheek.be  Fact Sheet for Healthcare Providers:  GravelBags.it  This test is no t yet approved or cleared by the [place] FDA and  has been authorized for detection and/or diagnosis of SARS-CoV-2 by FDA under an  Emergency Use Authorization (EUA). This EUA will remain  in effect (meaning this test can be used) for the duration of the COVID-19 declaration under Section 564(b)(1) of the Act, 21 U.S.C. section 360bbb-3(b)(1), unless the authorization is terminated or revoked sooner.     Influenza A by PCR NEGATIVE NEGATIVE Final   Influenza B by PCR NEGATIVE NEGATIVE Final    Comment: (NOTE) The Xpert Xpress SARS-CoV-2/FLU/RSV assay is intended as an aid in  the diagnosis of influenza from Nasopharyngeal swab specimens and  should not be used as a sole basis for treatment. Nasal washings and  aspirates are unacceptable for Xpert Xpress SARS-CoV-2/FLU/RSV  testing.  Fact Sheet for Patients: PinkCheek.be  Fact Sheet for Healthcare Providers: GravelBags.it  This test is not yet approved or cleared by the [place] FDA and   has been authorized for detection and/or diagnosis of SARS-CoV-2 by  FDA under an Emergency Use Authorization (EUA). This EUA will remain  in effect (meaning this test can be used) for the duration of the  Covid-19 declaration under Section 564(b)(1) of the Act, 21  U.S.C. section 360bbb-3(b)(1), unless the authorization is  terminated or revoked. Performed at [facility], [place] [address], [place], [place] [address]      Time coordinating discharge in minutes: 65  SIGNED:   Debbe Odea, MD  [facility] 08/17/2020, 12:54 PM

## 2020-09-21 ENCOUNTER — Ambulatory Visit: Payer: Medicare Other | Admitting: Internal Medicine

## 2020-11-13 DEATH — deceased

## 2022-09-06 IMAGING — MR MR HEAD W/O CM
12 of 13 series · 44 of 48 positions shown · IV contrast (gadavist)
Comparison: Prior CT from 07/23/2020.

CLINICAL DATA: Follow-up examination for acute stroke.

EXAM:
MRI HEAD WITHOUT CONTRAST
MRA HEAD WITHOUT CONTRAST
MRA NECK WITHOUT AND WITH CONTRAST
TECHNIQUE: Multiplanar, multiecho pulse sequences of the brain and surrounding
structures were obtained without intravenous contrast. Angiographic
images of the Circle of Willis were obtained using MRA technique
without intravenous contrast. Angiographic images of the neck were
obtained using MRA technique without and with intravenous contrast.
Carotid stenosis measurements (when applicable) are obtained
utilizing NASCET criteria, using the distal internal carotid
diameter as the denominator.
CONTRAST:  6mL GADAVIST GADOBUTROL 1 MMOL/ML IV SOLN

[Series 5: DWI · axial · 3.0mm · 0.88mm/px · z∈[-115,+20]mm · 8 of 96 slices shown (1 of 4)]
[im 1/96]
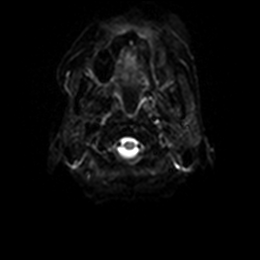
[im 14/96]
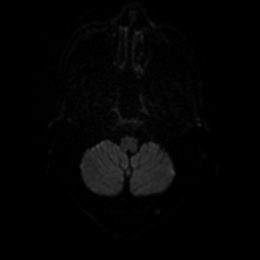
[im 28/96]
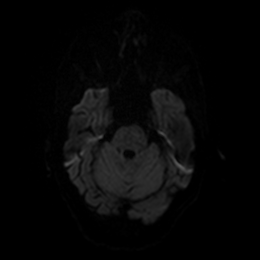
[im 41/96]
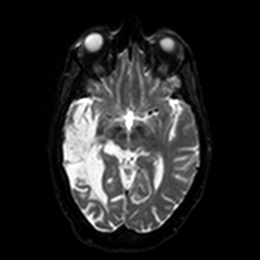
[im 55/96]
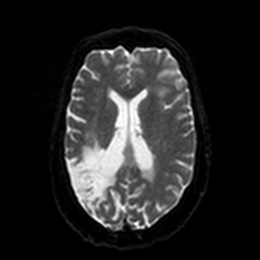
[im 68/96]
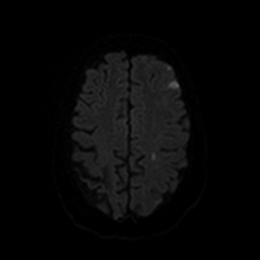
[im 82/96]
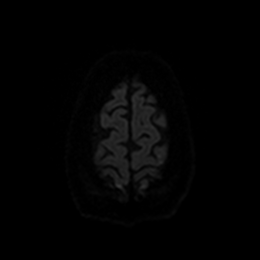
[im 96/96]
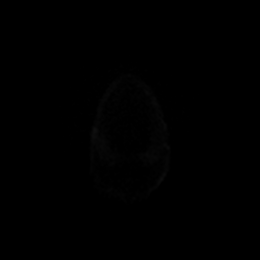

[Series 6: DWI · axial · 3.0mm · 0.88mm/px · z∈[-115,+20]mm · 4 of 48 slices shown (2 of 4)]
[im 1/48]
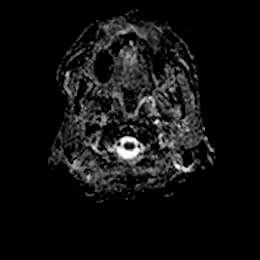
[im 16/48]
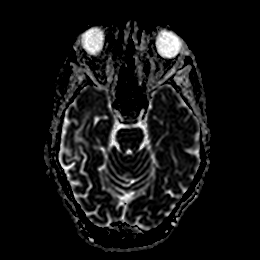
[im 32/48]
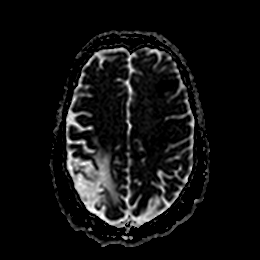
[im 48/48]
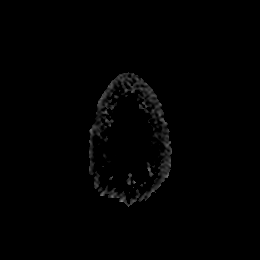

[Series 7: DWI · coronal · 4.0mm · 0.88mm/px · 5 of 66 slices shown (3 of 4)]
[im 1/66]
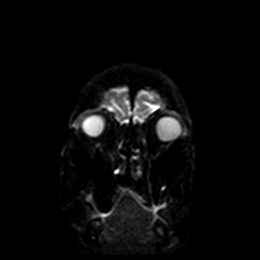
[im 17/66]
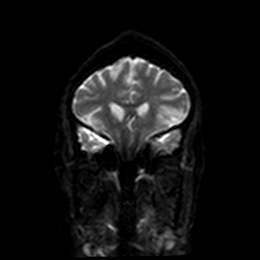
[im 33/66]
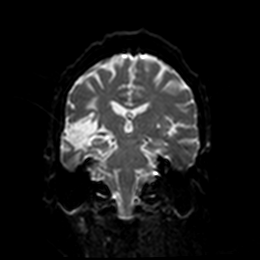
[im 49/66]
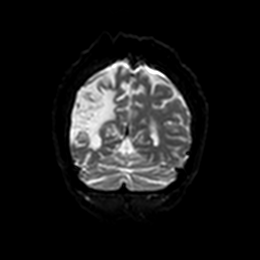
[im 66/66]
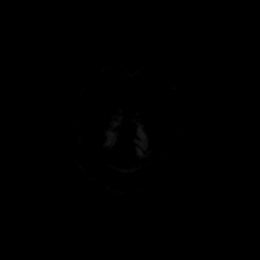

[Series 8: DWI · coronal · 4.0mm · 0.88mm/px · 3 of 33 slices shown (4 of 4)]
[im 1/33]
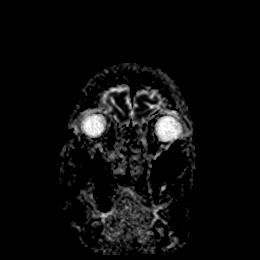
[im 17/33]
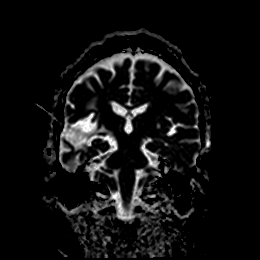
[im 33/33]
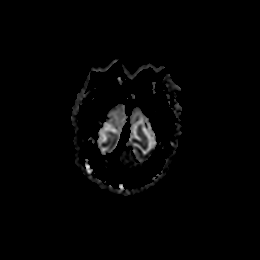

[Series 9: T1 · sagittal · 5.0mm · 0.72mm/px · 2 of 25 slices shown]
[im 1/25]
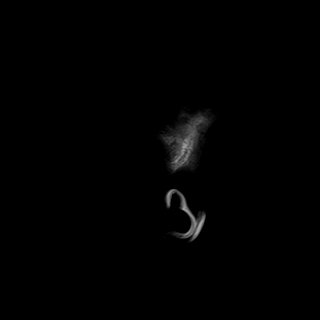
[im 25/25]
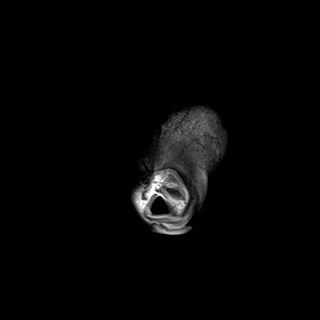

[Series 10: T2 · axial · 5.0mm · 0.72mm/px · z∈[-109,+31]mm · 2 of 25 slices shown (1 of 2)]
[im 1/25]
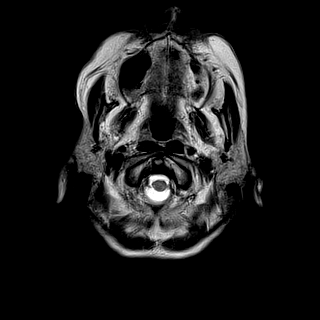
[im 25/25]
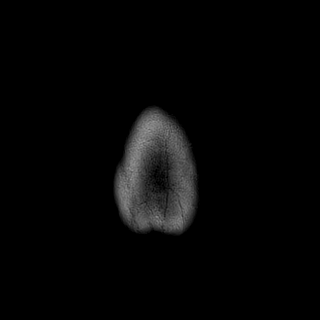

[Series 11: FLAIR · axial · 5.0mm · 0.90mm/px · z∈[-109,+31]mm · 2 of 25 slices shown]
[im 1/25]
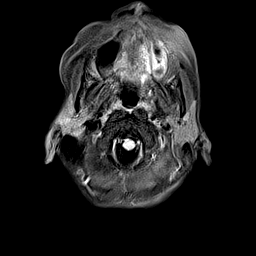
[im 25/25]
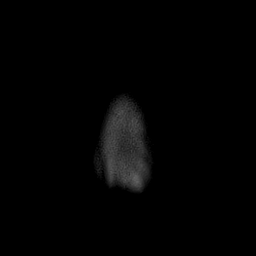

[Series 12: mag_images · axial · 3.0mm · 0.90mm/px · z∈[-115,+33]mm · 4 of 52 slices shown]
[im 1/52]
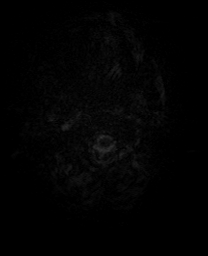
[im 18/52]
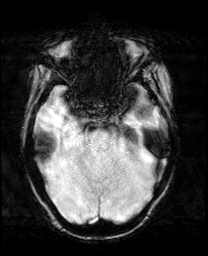
[im 35/52]
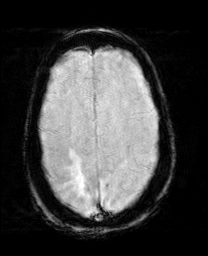
[im 52/52]
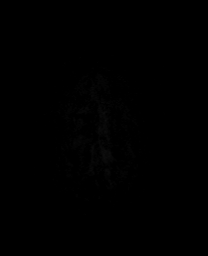

[Series 13: pha_images · axial · 3.0mm · 0.90mm/px · z∈[-115,+30]mm · 4 of 51 slices shown]
[im 1/51]
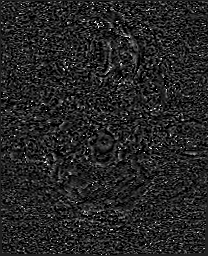
[im 17/51]
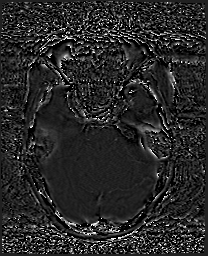
[im 34/51]
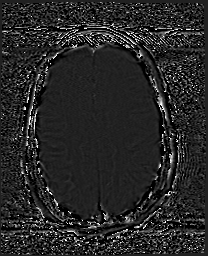
[im 51/51]
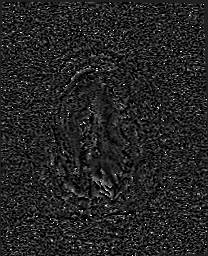

[Series 14: swi_images · axial · 3.0mm · 0.90mm/px · z∈[-115,+33]mm · 4 of 52 slices shown]
[im 1/52]
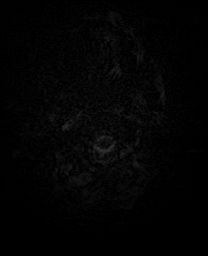
[im 18/52]
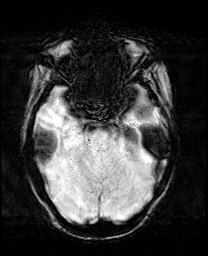
[im 35/52]
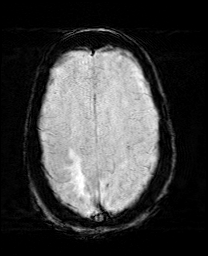
[im 52/52]
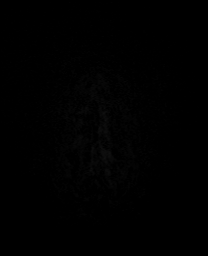

[Series 15: mip_images(sw) · axial · 24.0mm · 0.90mm/px · z∈[-105,+23]mm · 4 of 45 slices shown]
[im 1/45]
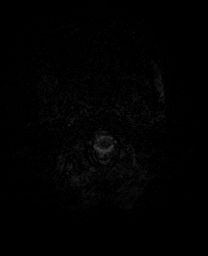
[im 15/45]
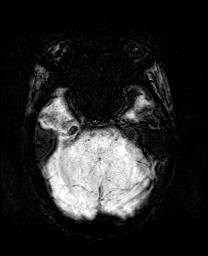
[im 30/45]
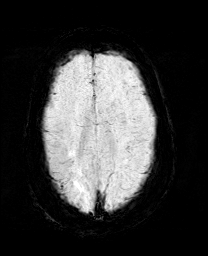
[im 45/45]
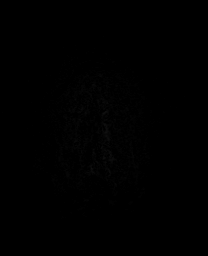

[Series 17: T2 · coronal · 5.0mm · 0.72mm/px · 2 of 31 slices shown (2 of 2)]
[im 1/31]
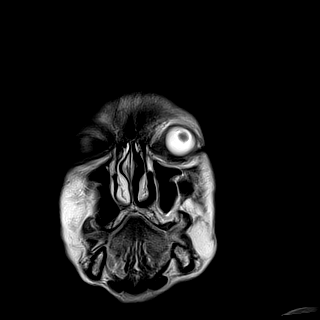
[im 31/31]
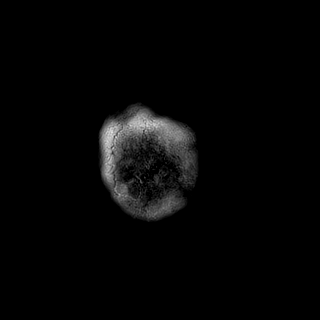

[44 of 48 positions shown; findings below may reference images not displayed]

FINDINGS: MRI HEAD FINDINGS

Brain: Examination mildly degraded by motion artifact.

Diffuse prominence of the CSF containing spaces compatible
generalized age-related cerebral atrophy. Area of encephalomalacia
and gliosis involving the right parietotemporal region compatible
with a chronic right MCA territory infarct. Associated mild chronic
hemosiderin staining within this region.

Confluent wedge-shaped area of restricted diffusion measuring up to
3.5 cm in diameter seen involving the anterior left frontal lobe,
corresponding with abnormality on prior CT. Finding consistent with
and ischemic infarct, acute to early subacute in appearance.
Multiple additional scattered acute to early subacute ischemic
infarcts seen involving the cortical and subcortical left frontal,
parietal, and occipital lobes, somewhat watershed in distribution.
Few additional punctate acute to early subacute ischemic cortical
infarct noted involving the right frontal lobe (series 5, images 86,
85). No associated hemorrhage or mass effect about these infarcts.

Gray-white matter differentiation otherwise maintained. No acute
intracranial hemorrhage. No mass lesion or midline shift. Mild ex
vacuo dilatation of the right lateral ventricle related to the
chronic right MCA territory infarct. No hydrocephalus. No
extra-axial fluid collection. Pituitary gland suprasellar region
within normal limits.

Vascular: Major intracranial vascular flow voids are maintained.

Skull and upper cervical spine: Craniocervical junction within
normal limits. Bone marrow signal intensity normal. No scalp soft
tissue abnormality.

Sinuses/Orbits: Globes and orbital soft tissues within normal
limits. Mild scattered mucosal thickening noted within the ethmoidal
air cells and maxillary sinuses. Paranasal sinuses are otherwise
clear. No mastoid effusion. Inner ear structures grossly normal.

Other: None.

MRA HEAD FINDINGS

ANTERIOR CIRCULATION:

Examination mildly degraded by motion.

Visualized distal cervical segments of the internal carotid arteries
are patent with antegrade flow. Scattered atheromatous irregularity
throughout the carotid siphons without flow-limiting stenosis. ICA
termini well perfused. A1 segments patent bilaterally. Normal
anterior communicating artery complex. Anterior cerebral arteries
patent distally without significant stenosis. No M1 stenosis or
occlusion. Negative MCA bifurcations. No proximal MCA branch
occlusion. Scattered atheromatous irregularity throughout the distal
MCA branches bilaterally. Right MCA branches somewhat attenuated as
compared to the left, in keeping with the chronic right MCA
territory infarct.

POSTERIOR CIRCULATION:

Vertebral arteries patent to the vertebrobasilar junction without
stenosis. Right vertebral artery slightly dominant. Both picas
patent proximally. Basilar mildly irregular but widely patent to its
distal aspect without stenosis. Superior cerebral arteries patent
bilaterally. 2 mm somewhat funnel shaped outpouching at the origin
of the left SCA favored to reflect a vascular infundibulum. Both PCA
supplied via the basilar or as well as prominent bilateral posterior
communicating arteries. PCAs mildly irregular without proximal
high-grade P1 and P2 stenosis. Focal severe distal right P3 stenosis
noted (series 7338, image 5).

No intracranial aneurysm.

MRA NECK FINDINGS

AORTIC ARCH: Visualized aortic arch of normal caliber. Bovine
branching pattern with common origin of the right brachiocephalic
and left common carotid arteries noted. No flow-limiting stenosis
about the origin of the great vessels.

RIGHT CAROTID SYSTEM: Right common carotid artery widely patent from
its origin to the bifurcation. No significant atheromatous
irregularity or narrowing about the right bifurcation. Right ICA
widely patent distally without stenosis, dissection or occlusion.

LEFT CAROTID SYSTEM: Left common carotid artery patent from its
origin to the bifurcation without stenosis. Minimal atheromatous
irregularity about the left bifurcation without significant
stenosis. Left ICA widely patent distally without stenosis,
dissection, or occlusion.

VERTEBRAL ARTERIES: Both vertebral arteries arise from the
subclavian arteries. No proximal subclavian artery stenosis.
Vertebral arteries widely patent within the neck without stenosis,
dissection or occlusion.
IMPRESSION: MRI HEAD IMPRESSION:

1. Acute to early subacute anterior left frontal lobe infarct, left
MCA distribution, corresponding with abnormality from prior CT. No
associated hemorrhage or mass effect.
2. Multiple additional acute to early subacute ischemic infarcts
involving the left frontal, parietal, and occipital lobes, watershed
in distribution. Few additional punctate right cerebral cortical
infarcts as above. No associated hemorrhage.
3. Underlying chronic right MCA infarct, with additional remote
right thalamic infarct.
4. Underlying age-related cerebral atrophy.

MRA HEAD IMPRESSION:

1. Negative intracranial MRA for large vessel occlusion.
2. Mild diffuse atheromatous irregularity throughout the
intracranial circulation. No proximal high-grade or correctable
stenosis.

MRA NECK IMPRESSION:

Negative MRA of the neck with wide patency of both carotid artery
systems and vertebral arteries. No hemodynamically significant
stenosis or other acute vascular abnormality.
